# Patient Record
Sex: Female | Born: 1990 | Hispanic: Yes | Marital: Married | Smoking: Never smoker
Health system: Southern US, Community
[De-identification: ages and names within clinical notes are randomized; demographics above are authoritative.]

## PROBLEM LIST (undated history)

## (undated) ENCOUNTER — Ambulatory Visit: Payer: Medicaid Other | Source: Home / Self Care

## (undated) DIAGNOSIS — E039 Hypothyroidism, unspecified: Secondary | ICD-10-CM

## (undated) DIAGNOSIS — R51 Headache: Secondary | ICD-10-CM

## (undated) DIAGNOSIS — F32A Depression, unspecified: Secondary | ICD-10-CM

## (undated) DIAGNOSIS — F329 Major depressive disorder, single episode, unspecified: Secondary | ICD-10-CM

## (undated) DIAGNOSIS — E079 Disorder of thyroid, unspecified: Secondary | ICD-10-CM

## (undated) DIAGNOSIS — F419 Anxiety disorder, unspecified: Secondary | ICD-10-CM

## (undated) DIAGNOSIS — K8020 Calculus of gallbladder without cholecystitis without obstruction: Secondary | ICD-10-CM

## (undated) HISTORY — DX: Depression, unspecified: F32.A

## (undated) HISTORY — DX: Anxiety disorder, unspecified: F41.9

## (undated) HISTORY — DX: Disorder of thyroid, unspecified: E07.9

## (undated) HISTORY — DX: Major depressive disorder, single episode, unspecified: F32.9

---
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
## 2010-03-29 HISTORY — PX: BREAST SURGERY: SHX581

## 2010-04-28 NOTE — Letter (Signed)
Summary: MED SOLUTIONS//APPROVED  MED SOLUTIONS//APPROVED   Imported By: Arta Bruce 07/07/2009 14:41:35  _____________________________________________________________________  External Attachment:    Type:   Image     Comment:   External Document

## 2010-04-28 NOTE — Letter (Signed)
Summary: MED/SOLUTIONS  MED/SOLUTIONS   Imported By: Arta Bruce 07/07/2009 14:37:47  _____________________________________________________________________  External Attachment:    Type:   Image     Comment:   External Document

## 2010-06-11 LAB — DIFFERENTIAL
Basophils Absolute: 0.1 10*3/uL (ref 0.0–0.1)
Basophils Relative: 1 % (ref 0–1)
Eosinophils Absolute: 0.3 10*3/uL (ref 0.0–0.7)
Eosinophils Relative: 4 % (ref 0–5)
Lymphocytes Relative: 43 % (ref 12–46)
Lymphs Abs: 3.2 10*3/uL (ref 0.7–4.0)
Monocytes Absolute: 0.6 10*3/uL (ref 0.1–1.0)
Monocytes Relative: 8 % (ref 3–12)
Neutro Abs: 3.2 10*3/uL (ref 1.7–7.7)
Neutrophils Relative %: 44 % (ref 43–77)

## 2010-06-11 LAB — CBC
HCT: 38.4 % (ref 36.0–46.0)
Hemoglobin: 13.7 g/dL (ref 12.0–15.0)
MCH: 32.5 pg (ref 26.0–34.0)
MCHC: 35.7 g/dL (ref 30.0–36.0)
MCV: 91 fL (ref 78.0–100.0)
Platelets: 221 10*3/uL (ref 150–400)
RBC: 4.22 MIL/uL (ref 3.87–5.11)
RDW: 12.3 % (ref 11.5–15.5)
WBC: 7.3 10*3/uL (ref 4.0–10.5)

## 2010-06-11 LAB — COMPREHENSIVE METABOLIC PANEL
ALT: 19 U/L (ref 0–35)
AST: 24 U/L (ref 0–37)
Albumin: 4.2 g/dL (ref 3.5–5.2)
Alkaline Phosphatase: 63 U/L (ref 39–117)
BUN: 10 mg/dL (ref 6–23)
CO2: 28 mEq/L (ref 19–32)
Calcium: 9.3 mg/dL (ref 8.4–10.5)
Chloride: 106 mEq/L (ref 96–112)
Creatinine, Ser: 0.77 mg/dL (ref 0.4–1.2)
GFR calc Af Amer: >60 mL/min (ref >60)
GFR calc non Af Amer: >60 mL/min (ref >60)
Glucose, Bld: 90 mg/dL (ref 70–99)
Potassium: 3.7 mEq/L (ref 3.5–5.1)
Sodium: 139 mEq/L (ref 135–145)
Total Bilirubin: 0.5 mg/dL (ref 0.3–1.2)
Total Protein: 6.7 g/dL (ref 6.0–8.3)

## 2010-06-11 LAB — WET PREP, GENITAL
Trich, Wet Prep: NONE SEEN
Yeast Wet Prep HPF POC: NONE SEEN

## 2010-06-11 LAB — URINALYSIS, ROUTINE W REFLEX MICROSCOPIC
Glucose, UA: NEGATIVE mg/dL
Hgb urine dipstick: NEGATIVE
Protein, ur: NEGATIVE mg/dL
Specific Gravity, Urine: 1.028 (ref 1.005–1.030)
pH: 5.5 (ref 5.0–8.0)

## 2010-06-11 LAB — GC/CHLAMYDIA PROBE AMP, GENITAL
Chlamydia, DNA Probe: NEGATIVE
GC Probe Amp, Genital: NEGATIVE

## 2010-06-11 LAB — URINE MICROSCOPIC-ADD ON

## 2010-06-11 LAB — POCT PREGNANCY, URINE: Preg Test, Ur: NEGATIVE

## 2010-07-07 LAB — URINALYSIS, ROUTINE W REFLEX MICROSCOPIC
Hgb urine dipstick: NEGATIVE
Protein, ur: NEGATIVE mg/dL
Specific Gravity, Urine: 1.027 (ref 1.005–1.030)
Urobilinogen, UA: 1 mg/dL (ref 0.0–1.0)

## 2010-07-07 LAB — URINE MICROSCOPIC-ADD ON

## 2010-07-07 LAB — PREGNANCY, URINE: Preg Test, Ur: NEGATIVE

## 2010-10-07 ENCOUNTER — Emergency Department (HOSPITAL_COMMUNITY): Payer: Self-pay

## 2010-10-07 ENCOUNTER — Emergency Department (HOSPITAL_COMMUNITY)
Admission: EM | Admit: 2010-10-07 | Discharge: 2010-10-07 | Disposition: A | Discharge status: Home / Self Care | Payer: Self-pay | Attending: Emergency Medicine | Admitting: Emergency Medicine

## 2010-10-07 DIAGNOSIS — N949 Unspecified condition associated with female genital organs and menstrual cycle: Secondary | ICD-10-CM | POA: Insufficient documentation

## 2010-10-07 DIAGNOSIS — R300 Dysuria: Secondary | ICD-10-CM | POA: Insufficient documentation

## 2010-10-07 LAB — POCT PREGNANCY, URINE: Preg Test, Ur: NEGATIVE

## 2010-10-07 LAB — URINALYSIS, ROUTINE W REFLEX MICROSCOPIC
Bilirubin Urine: NEGATIVE
Glucose, UA: NEGATIVE mg/dL
Urobilinogen, UA: 0.2 mg/dL (ref 0.0–1.0)

## 2010-10-07 LAB — URINE MICROSCOPIC-ADD ON

## 2010-10-07 LAB — WET PREP, GENITAL: Trich, Wet Prep: NONE SEEN

## 2010-10-07 MED ORDER — GADOBENATE DIMEGLUMINE 529 MG/ML IV SOLN
15.0000 mL | Freq: Once | INTRAVENOUS | Status: AC | PRN
  Administered 2010-10-07: 15 mL via INTRAVENOUS

## 2010-10-08 LAB — URINE CULTURE: Colony Count: 50000

## 2010-10-08 LAB — GC/CHLAMYDIA PROBE AMP, GENITAL: Chlamydia, DNA Probe: NEGATIVE

## 2010-10-16 ENCOUNTER — Ambulatory Visit: Payer: Self-pay | Admitting: Gynecology

## 2010-10-16 ENCOUNTER — Ambulatory Visit (INDEPENDENT_AMBULATORY_CARE_PROVIDER_SITE_OTHER): Payer: Self-pay | Admitting: Gynecology

## 2010-10-16 DIAGNOSIS — N643 Galactorrhea not associated with childbirth: Secondary | ICD-10-CM

## 2010-10-16 DIAGNOSIS — N949 Unspecified condition associated with female genital organs and menstrual cycle: Secondary | ICD-10-CM

## 2010-10-16 DIAGNOSIS — N83209 Unspecified ovarian cyst, unspecified side: Secondary | ICD-10-CM

## 2010-10-19 ENCOUNTER — Telehealth: Payer: Self-pay | Admitting: *Deleted

## 2010-10-19 ENCOUNTER — Other Ambulatory Visit: Payer: Self-pay | Admitting: Gynecology

## 2010-10-19 DIAGNOSIS — N63 Unspecified lump in unspecified breast: Secondary | ICD-10-CM

## 2010-10-19 DIAGNOSIS — N6310 Unspecified lump in the right breast, unspecified quadrant: Secondary | ICD-10-CM

## 2010-10-19 NOTE — Telephone Encounter (Signed)
APPOINTMENT SET AT THE [facility] 10/21/10 AT 9:30A.M. FOR RT BREAST MASS TAIL SPENCE PER TF.  APPOINTMENT  ALSO SET WITH [facility] FOR RT BREAST MASS AFTER THEN ABOVE IS DONE ON 10/29/10 AT 8:00A.M.WITH DR. Derrell Lolling. PT INFORMED WITH ALL THE ABOVE AND PHONE NUMBERS GIVEN TO PATIENT TO RESCHEDULE IF NEEDED.

## 2010-10-20 ENCOUNTER — Encounter: Payer: Self-pay | Admitting: *Deleted

## 2010-10-21 ENCOUNTER — Ambulatory Visit
Admission: RE | Admit: 2010-10-21 | Discharge: 2010-10-21 | Disposition: A | Discharge status: Home / Self Care | Payer: Self-pay | Source: Ambulatory Visit | Attending: Gynecology | Admitting: Gynecology

## 2010-10-21 DIAGNOSIS — N63 Unspecified lump in unspecified breast: Secondary | ICD-10-CM

## 2010-10-29 ENCOUNTER — Ambulatory Visit (INDEPENDENT_AMBULATORY_CARE_PROVIDER_SITE_OTHER): Payer: Self-pay | Admitting: General Surgery

## 2010-10-30 ENCOUNTER — Telehealth: Payer: Self-pay | Admitting: *Deleted

## 2010-10-30 DIAGNOSIS — R102 Pelvic and perineal pain: Secondary | ICD-10-CM

## 2010-10-30 MED ORDER — IBUPROFEN 800 MG PO TABS: 800.0000 mg | ORAL_TABLET | Freq: Three times a day (TID) | ORAL | Status: AC | PRN

## 2010-10-30 NOTE — Telephone Encounter (Signed)
PT INFORMED WITH ALL THE BELOW.

## 2010-10-30 NOTE — Telephone Encounter (Signed)
(  SEE CHART NOTE 10/16/10) PT C/O PELVIC PAIN X 3DAYS NOW. PT RATED PAIN SCALE #4. PT WANTS TO KNOW WHAT SHE SHOULD DO. PT HAS NOT TOOK ANYTHING TO RELIEVE THE PAIN.PLEASE ADVISE.

## 2010-10-30 NOTE — Telephone Encounter (Signed)
We'll start with Motrin 800 mg every 8 hours and I sent this to the pharmacy was out she does if her pain continues she is to make appointment to come in to see me about possible surgery.

## 2010-11-05 ENCOUNTER — Encounter (INDEPENDENT_AMBULATORY_CARE_PROVIDER_SITE_OTHER): Payer: Self-pay | Admitting: General Surgery

## 2010-11-05 ENCOUNTER — Ambulatory Visit (INDEPENDENT_AMBULATORY_CARE_PROVIDER_SITE_OTHER): Payer: Self-pay | Admitting: General Surgery

## 2010-11-05 VITALS — BP 122/82 | HR 68 | Temp 97.1°F | Ht 64.0 in | Wt 167.2 lb

## 2010-11-05 DIAGNOSIS — N63 Unspecified lump in unspecified breast: Secondary | ICD-10-CM

## 2010-11-05 DIAGNOSIS — N6310 Unspecified lump in the right breast, unspecified quadrant: Secondary | ICD-10-CM

## 2010-11-05 NOTE — Patient Instructions (Signed)
I suspect that you have a fibroadenoma in the right breast. This has been enlarging by history and looks like a fibroadenoma on the ultrasound. You will be scheduled for excision of this under general anesthesia in the near future.

## 2010-11-05 NOTE — Progress Notes (Signed)
Subjective:     Patient ID: Emma Robbins, female   DOB: 12-21-1990, 20 y.o.   MRN: [id number]  HPI This is a 20 year old Hispanic female, sent to me through the courtesy of Dr. Audie Box for evaluation of an enlarging mass in the right breast, lower outer quadrant.  The patient states that she noticed a small mass in the right breast laterally about one year ago. She said that mammograms were done and everything was normal. She says the mass has doubled or tripled in size in one year. Minimal pain. Recent ultrasound was performed at the breast center of [facility] on October 21, 2010. This shows a probable fibroadenoma at the 9:00 position in the far lateral right breast. The patient is very anxious about this because it has been enlarging. She denies any skin change, inflammatory change, or nipple discharge.  She is also seeing Dr. Audie Box because of ovarian cyst and has been placed on birth control pills  Past Medical History  Diagnosis Date  . Fatigue   . Lump in female breast   . Breast pain in female   . Abdominal pain   . Nausea & vomiting   . Blood in urine   . Dizziness   . Rectal bleeding    Current Outpatient Prescriptions  Medication Sig Dispense Refill  . ibuprofen (ADVIL,MOTRIN) 200 MG tablet Take 200 mg by mouth every 6 (six) hours as needed.        . norethindrone-ethinyl estradiol-iron (ESTROSTEP FE,TILIA FE,TRI-LEGEST FE) 1-20/1-30/1-35 MG-MCG tablet Take 1 tablet by mouth daily. Patient unsure of name of birth control issued for cyst.       . ibuprofen (ADVIL,MOTRIN) 800 MG tablet Take 1 tablet (800 mg total) by mouth every 8 (eight) hours as needed for pain.  30 tablet  0   No Known Allergies  Family History  Problem Relation Age of Onset  . Hypertension Maternal Grandmother   . Diabetes Paternal Grandfather   . Hyperlipidemia Father   . Thyroid disease Brother     History  Substance Use Topics  . Smoking status: Never Smoker   . Smokeless  tobacco: Not on file  . Alcohol Use: No      Review of Systems  Constitutional: Negative.   HENT: Negative.   Eyes: Negative.   Respiratory: Negative.   Cardiovascular: Negative.   Gastrointestinal: Negative.   Genitourinary: Negative.   Musculoskeletal: Negative.   Neurological: Negative.   Hematological: Negative.   Psychiatric/Behavioral: Negative.        Objective:   Physical Exam  Constitutional: She is oriented to person, place, and time. She appears well-developed and well-nourished. No distress.  HENT:  Head: Normocephalic and atraumatic.  Mouth/Throat: No oropharyngeal exudate.  Eyes: Conjunctivae are normal. Left eye exhibits no discharge. No scleral icterus.  Neck: Neck supple. No JVD present. No tracheal deviation present. No thyromegaly present.  Cardiovascular: Normal rate and regular rhythm.  Exam reveals friction rub.   No murmur heard. Pulmonary/Chest: Effort normal and breath sounds normal. No respiratory distress. She has no wheezes. She exhibits no tenderness.    Abdominal: Soft. Bowel sounds are normal. She exhibits no distension and no mass. There is no tenderness. There is no rebound and no guarding.  Musculoskeletal: Normal range of motion. She exhibits no edema and no tenderness.  Lymphadenopathy:    She has no cervical adenopathy.  Neurological: She is alert and oriented to person, place, and time. She exhibits normal muscle tone.  Skin: Skin is dry.  No rash noted. No erythema. No pallor.  Psychiatric: She has a normal mood and affect. Her behavior is normal. Judgment and thought content normal.       Assessment:   3 cm right breast mass at lower outer quadrant. 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 This has been enlarging.      Plan:     We'll schedule for elective excision of the right breast mass under anesthesia as an outpatient in the near future.  I have discussed the indications and details of  the surgery with the patient. Risks and complications have been outlined, including limited to bleeding, infection, cosmetic deformity, nerve damage chronic pain or numbness, reoperation if this is malignant, cardiac pulmonary and thromboembolic problems. She seems to understand all these issues well. At this time all her questions were answered. She is in full agree with this plan. She is very anxious to have this removed.

## 2010-11-16 ENCOUNTER — Ambulatory Visit: Payer: Self-pay

## 2010-11-16 ENCOUNTER — Other Ambulatory Visit: Payer: Self-pay

## 2010-11-16 ENCOUNTER — Encounter: Payer: Self-pay | Admitting: Gynecology

## 2010-11-16 ENCOUNTER — Ambulatory Visit (INDEPENDENT_AMBULATORY_CARE_PROVIDER_SITE_OTHER): Payer: Self-pay | Admitting: Gynecology

## 2010-11-16 DIAGNOSIS — N926 Irregular menstruation, unspecified: Secondary | ICD-10-CM

## 2010-11-16 DIAGNOSIS — R1031 Right lower quadrant pain: Secondary | ICD-10-CM

## 2010-11-16 DIAGNOSIS — R1903 Right lower quadrant abdominal swelling, mass and lump: Secondary | ICD-10-CM

## 2010-11-16 DIAGNOSIS — D3910 Neoplasm of uncertain behavior of unspecified ovary: Secondary | ICD-10-CM

## 2010-11-16 DIAGNOSIS — N83209 Unspecified ovarian cyst, unspecified side: Secondary | ICD-10-CM

## 2010-11-16 DIAGNOSIS — N949 Unspecified condition associated with female genital organs and menstrual cycle: Secondary | ICD-10-CM

## 2010-11-16 NOTE — Progress Notes (Signed)
Patient presents in followup of her right ovarian/paraovarian cystic area. In review she presented to the emergency room in July complaining of pain which actually have been going on for 2 years but became acute in July. Ultrasound showed a left hemorrhagic appearing cyst at 4-5 cm and a simple right paraovarian cyst negative flow with a small mural nodule. MRI confirmed this and she then followed up with [place]. A repeat ultrasound was ordered and she presents today for that. Ultrasound shows persistence of the right ovarian/paraovarian cyst at 65 mm with a small mural nodule. There is negative flow.  Her left ovary is normal the prior hemorrhagic-appearing cyst has resolved.  Her CA 125 was ordered previously and returned 6.2. Patient currently is having no pain and she has started on oral contraceptives as per our last visit.  I discussed with her and her mother the situation. My concern for malignancy is low but possible. Options for management include surgery now versus observation reassessment with ultrasound and and reconsidering surgery. She is pain-free at this point. After lengthy discussion tentatively schedule her for surgery in 2 months perform an ultrasound the week before and if this area persists then we'll proceed with surgery.  Patient agrees with the plan and is comfortable with waiting 2 months for reassessment.  She does understand the potential for malignancy.

## 2010-11-20 ENCOUNTER — Ambulatory Visit (HOSPITAL_BASED_OUTPATIENT_CLINIC_OR_DEPARTMENT_OTHER)
Admission: RE | Admit: 2010-11-20 | Discharge: 2010-11-20 | Disposition: A | Discharge status: Home / Self Care | Payer: Self-pay | Source: Ambulatory Visit | Attending: General Surgery | Admitting: General Surgery

## 2010-11-20 ENCOUNTER — Other Ambulatory Visit (INDEPENDENT_AMBULATORY_CARE_PROVIDER_SITE_OTHER): Payer: Self-pay | Admitting: General Surgery

## 2010-11-20 DIAGNOSIS — D249 Benign neoplasm of unspecified breast: Secondary | ICD-10-CM

## 2010-11-20 DIAGNOSIS — Z01812 Encounter for preprocedural laboratory examination: Secondary | ICD-10-CM | POA: Insufficient documentation

## 2010-11-23 ENCOUNTER — Encounter: Payer: Self-pay | Admitting: Gynecology

## 2010-11-23 ENCOUNTER — Telehealth (INDEPENDENT_AMBULATORY_CARE_PROVIDER_SITE_OTHER): Payer: Self-pay

## 2010-11-23 NOTE — Progress Notes (Signed)
11-23-10 I sent patient a letter with her summary of charges for surgery Dr. Velvet Bathe has recommended.  I asked her to call me to schedule for Oct/Nov as Dr. Velvet Bathe recommended. ka

## 2010-11-23 NOTE — Telephone Encounter (Signed)
Pt home doing well. Po appt made. 

## 2010-11-23 NOTE — Op Note (Signed)
  Emma Robbins, Emma Robbins NO.:  [id number]  MEDICAL RECORD NO.:  [id number]  LOCATION:                                 FACILITY:  PHYSICIAN:  Angelia Mould. Derrell Lolling, M.D.DATE OF BIRTH:  1990-10-07  DATE OF PROCEDURE:  11/20/2010 DATE OF DISCHARGE:                              OPERATIVE REPORT   PREOPERATIVE DIAGNOSIS:  Right breast mass, suspect fibroadenoma.  POSTOPERATIVE DIAGNOSIS:  Right breast mass, suspect fibroadenoma.  OPERATION PERFORMED:  Right partial mastectomy.  SURGEON:  Angelia Mould. Derrell Lolling, MD  OPERATIVE INDICATIONS:  This is a 20 year old Hispanic female who has noticed a palpable mass in the right breast for about 1 year.  She says the mass has almost tripled in size in 1 year.  There is almost no pain. Recent ultrasound shows a probable fibroadenoma at the 9 o'clock position in the far lateral right breast.  The patient is very anxious about this because of his rapid enlargement.  On exam, this feels like a large fibroadenoma, being very firm, very smooth, very rubbery, very well marginated, very mobile at the 8:30 position of the right breast very lateral.  She is brought to the operating room electively.  OPERATIVE TECHNIQUE:  Following the induction of a general LMA anesthetic, the patient's right breast was prepped and draped in a sterile fashion.  The patient was identified as correct patient, correct procedure, and correct site.  Intravenous antibiotics were given. Marcaine 0.5% with epinephrine was used as a local infiltration anesthetic.  A curvilinear incision was made in the lateral aspect of the right breast.  The incision was in Langer's lines.  Dissection carried down through the subcutaneous tissue and into the superficial breast tissue when we encountered the capsule of a typical fibroadenoma. This had at least two or three lobes to it, but dissected away from the surrounding tissues fairly easily.  We used electrocautery to  dissected away, intact.  It looked to be about 2.5 x 2 cm in size and it was sent for routine histology.  I further explored the wound palpating thoroughly and felt no other fibroadenomas or masses.  The wound was irrigated with saline.  Hemostasis was excellent and achieved with electrocautery.  The deeper tissues were closed with interrupted sutures of 3-0 Vicryl and the skin was closed with a running subcuticular suture of 4-0 Monocryl and Dermabond.  The patient tolerated the procedure well and was taken to the recovery room in excellent condition.  Estimated blood loss was about 5-10 mL. Complications none.  Sponge, needle and instrument counts were correct.     Angelia Mould. Derrell Lolling, M.D.     HMI/MEDQ  D:  11/20/2010  T:  11/20/2010  Job:  [id number]  cc:   Marcial Pacas P. Fontaine, M.D.  Electronically Signed by Claud Kelp M.D. on 11/23/2010 07:02:37 PM

## 2010-11-24 ENCOUNTER — Telehealth (INDEPENDENT_AMBULATORY_CARE_PROVIDER_SITE_OTHER): Payer: Self-pay

## 2010-11-24 NOTE — Telephone Encounter (Signed)
Pt advised of path result as benign.

## 2010-11-24 NOTE — Telephone Encounter (Signed)
lmom for pt to call for path result.

## 2010-12-01 ENCOUNTER — Telehealth: Payer: Self-pay

## 2010-12-01 ENCOUNTER — Other Ambulatory Visit: Payer: Self-pay | Admitting: Gynecology

## 2010-12-01 DIAGNOSIS — N83209 Unspecified ovarian cyst, unspecified side: Secondary | ICD-10-CM

## 2010-12-01 NOTE — Telephone Encounter (Signed)
I called patient back. I scheduled her Lap Ovarian Cystectomy for Wed Sept 19 at 1pm at [facility].  I scheduled her preop consult for Monday Sept 17 at 12:30pm. Instructions given to patient.   Patient is private pay and has some questions regarding her prepymt to [facility]. I will be talking with [place]. Regarding these issues and I will get back with the patient.

## 2010-12-01 NOTE — Telephone Encounter (Signed)
Patient called in my voice mail stating that due to pain she would like to go ahead and schedule sooner than the 2-3 months she had decided upon at last office visit.  I will call her back and schedule her.  Will you still want another u/s if we schedule it in the next few weeks??  Also, if not, will you want preop consult with her?

## 2010-12-01 NOTE — Telephone Encounter (Signed)
Schedule now is okay and she will need a preop consult

## 2010-12-03 ENCOUNTER — Telehealth: Payer: Self-pay

## 2010-12-03 NOTE — Telephone Encounter (Signed)
Left message on voice mail that I have an answer in regards to arrangements for surgery prepayment.  I gave her the 380 line access.

## 2010-12-04 NOTE — Telephone Encounter (Signed)
Patient called me back. I told her our administrator said she would need to put $1000 down on her surgery prior to surgery.  Patient asked about anywhere else she could go where they would work with her on money. I told her about GYN clinic at [facility] and gave her her phone number. She is going somewhere today to talk with someone about helping with her medical bills. She wants me to hold surgery date until she talks with them and she will let me know what she wants to do after that.  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  I offered her financial assistance form for [employer] and she declined that until she sees what the people tell her today.  I will wait to hear from her.

## 2010-12-07 ENCOUNTER — Ambulatory Visit (INDEPENDENT_AMBULATORY_CARE_PROVIDER_SITE_OTHER): Payer: Self-pay | Admitting: General Surgery

## 2010-12-07 VITALS — BP 110/68 | HR 72

## 2010-12-07 DIAGNOSIS — Z9889 Other specified postprocedural states: Secondary | ICD-10-CM

## 2010-12-07 DIAGNOSIS — D249 Benign neoplasm of unspecified breast: Secondary | ICD-10-CM

## 2010-12-07 NOTE — Progress Notes (Signed)
Subjective:     Patient ID: Emma Robbins, female   DOB: 1990-10-29, 20 y.o.   MRN: [id number]  HPI This patient underwent excisional biopsy of a right breast mass on November 20, 2010. The final pathology report shows a benign fibroadenoma. The patient has been given a copy of her pathology report. She has no complaints about wound healing. She was she may return to the gym and exercise.  Review of Systems     Objective:   Physical Exam The wound is healing very nicely. Small incision lateral right breast. Minimal bruising. No hematoma. No infection.    Assessment:     Fibroadenoma right breast, lateral aspect, recovering uneventfully following excision.    Plan:     Okay to resume all normal physical activities without restriction, except no contact sports for 2 more weeks.  Mammogram should begin at age 26 .  Return to see me PRN.

## 2010-12-07 NOTE — Patient Instructions (Signed)
The final pathology report of your right breast biopsy shows a benign fibroadenoma. Your wounds are healing nicely. You may exercise, but I would avoid contact sports for 2 more weeks. You should get a mammogram at age 20 and annually thereafter unless there are new problems. Return to see me if any new problems arise.

## 2010-12-08 ENCOUNTER — Telehealth: Payer: Self-pay

## 2010-12-08 NOTE — Telephone Encounter (Signed)
Patient had said she would call me to confirm her surgery date after working out some financial details last week. Since I have not heard from her I called to follow up.  She said that she got approved for financial assistance from [facility]. I explained that we are small private practice and we do not participate with that assistance and the$1000 prepymt will still be required. She said that would not be a problem and she will be able to pay that and do her surgery as scheduled. She has pre op appt 9/17 and she will make her prepymt at that time.

## 2010-12-09 NOTE — Patient Instructions (Addendum)
   Your procedure is scheduled on:Wednesday 12/16/2010  Enter through the Main Entrance of [facility] at:11:30 am Pick up the phone at the desk and dial [phone]  Please call this number if you have any problems the morning of surgery: [phone]  Remember: Do not eat food after midnight  Do not drink clear liquids after:9 am  Do not wear jewelry, make-up, or FINGER nail polish Do not wear lotions, powders, or perfumes. Do not shave 48 hours prior to surgery. Do not bring valuables to the hospital. Leave suitcase in the car. After Surgery it may be brought to your room. For patients being admitted to the hospital, checkout time is 11:00am the day of discharge.  Patients discharged on the day of surgery will not be allowed to drive home.   Name and phone number of your driver: husband-Jose [phone]   Remember to use your hibiclens as instructed.

## 2010-12-10 ENCOUNTER — Encounter (HOSPITAL_COMMUNITY): Payer: Self-pay

## 2010-12-10 ENCOUNTER — Encounter (HOSPITAL_COMMUNITY)
Admission: RE | Admit: 2010-12-10 | Discharge: 2010-12-10 | Disposition: A | Discharge status: Home / Self Care | Payer: Self-pay | Source: Ambulatory Visit | Attending: Gynecology | Admitting: Gynecology

## 2010-12-10 LAB — CBC
MCH: 32.3 pg (ref 26.0–34.0)
MCHC: 34.5 g/dL (ref 30.0–36.0)
Platelets: 223 10*3/uL (ref 150–400)
RDW: 12.6 % (ref 11.5–15.5)

## 2010-12-10 LAB — SURGICAL PCR SCREEN: MRSA, PCR: NEGATIVE

## 2010-12-13 ENCOUNTER — Encounter (HOSPITAL_COMMUNITY): Payer: Self-pay | Admitting: Gynecology

## 2010-12-14 ENCOUNTER — Encounter: Payer: Self-pay | Admitting: Gynecology

## 2010-12-14 ENCOUNTER — Encounter (HOSPITAL_COMMUNITY): Payer: Self-pay | Admitting: Gynecology

## 2010-12-14 ENCOUNTER — Ambulatory Visit (INDEPENDENT_AMBULATORY_CARE_PROVIDER_SITE_OTHER): Payer: Self-pay | Admitting: Gynecology

## 2010-12-14 DIAGNOSIS — N83201 Unspecified ovarian cyst, right side: Secondary | ICD-10-CM

## 2010-12-14 DIAGNOSIS — N949 Unspecified condition associated with female genital organs and menstrual cycle: Secondary | ICD-10-CM

## 2010-12-14 DIAGNOSIS — R102 Pelvic and perineal pain: Secondary | ICD-10-CM

## 2010-12-14 DIAGNOSIS — N83209 Unspecified ovarian cyst, unspecified side: Secondary | ICD-10-CM

## 2010-12-14 NOTE — H&P (Signed)
Emma Robbins 23-May-1990 [id number]   Preoperative History and Physical  Chief complaint: Persistent right ovarian cyst with pelvic pain  History of present illness: 20 y.o.  G0 initially presented in June to the emergency room complaining of pelvic pain. Ultrasound and MRI showed a presumed clear right ovarian cyst although there is some question as to whether it is adnexal versus true ovarian measuring 6.2 cm with a small mural nodule. The left ovary showed a more complex cystic area measuring 4.9 cm that was felt to have a classic appearance of a hemorrhagic cyst. A repeat ultrasound was performed in August which showed persistence of the simple right presumed ovarian cyst measuring 6.5 cm a small mural nodule and a CA 125 of 6.2. There was negative Doppler flow to this area.  Her left ovarian cyst had resolved. The patient has had a persistence of right sided pelvic pain is admitted for laparoscopic evaluation and removal of the right ovarian cyst.  Options for expectant management with re\re ultrasound was offered but due to the discomfort the patient was to proceed with surgery. She was started on oral contraceptives and has continued on these.  Review of systems: Noncontributory except stated in HPI   Physical exam:  Afebrile vital signs are stable  General: well developed, well nourished female, no acute distress HEENT: normal  Lungs: clear to auscultation without wheezing, rales or rhonchi  Cardiac: regular rate without rubs, murmurs or gallops  Abdomen: soft, nontender without masses, guarding, rebound, organomegaly  Pelvic: external bus vagina: normal   Cervix: grossly normal  Uterus: normal size, midline and mobile, nontender  Adnexa: Right with tenderness, no masses.  Left without masses or tenderness    Assessment: 20 year old G0 with persistent right ovarian clear cyst small mural nodule negative Doppler flow CA 125 of 6.2.  Patient is having persistent pelvic  pain and elects for surgical intervention at this time.  Plan:  The options for management including expectant versus surgical intervention reviewed and offered to and the patient wants to proceed with surgery. I reviewed with her what's involved with laparoscopic surgery to include general anesthesia, multiple port sites, insufflation, trocar placement, use of sharp blunt dissection, electrocautery, harmonic scalpel and other technologies. She understands the plan will be to remove the cyst only and leave remaining ovarian tissue and fallopian tube but she also understands no guarantees are made and that I may need to remove her ovary and fallopian tube on that side and she understands and accepts this. The potential for fertility issues by remove her fallopian tube and ovary was discussed and she understands and accepts this. The patient also understands that no pelvic pathology may be encountered in that the cyst may have resolved and she may have a negative laparoscopy.  No guarantees as far as pain relief were made and she understands and accepts this. She also understands that we assume that this is a right ovarian cyst but it may be a paraovarian cyst or non-gynecologic accepts the possibility also.  The acute intraoperative and postoperative courses were reviewed and the risks to include infection, prolonged antibiotics, abscess formation requiring reoperation and abscess drainage. The risk of hemorrhage necessitating transfusion and the risks of transfusion including transfusion reaction, hepatitis, HIV, mad cow disease and other unknown entities. The risk of incisional complications including opening and draining of incisions, closure by secondary intention and long-term issues of cosmetic such as keloid formation as well as hernia formation was discussed with her. She understands there are  no guarantees that we will perform this laparoscopically and I may need to extend the incision which would mean a  larger scar and longer recovery period. The risks of inadvertent injury to internal organs either immediately recognized or delay recognized including bowel, bladder, ureters, vessels and nerves necessitating major exploratory reparative surgeries and future reparative surgeries including bladder repair, ureteral damage repair, bowel resection ,and ostomy formation was all discussed understood and accepted. Again the patient understands that I will attempt as conservative a procedure as I can but that we may need to take her ovary and fallopian tube and she gives me permission for this. I also discussed with her that this may be a malignancy and if it overtly is a malignancy we will terminate the procedure at that point and I will refer her to a gynecologic oncologist. The possibility during conservative removal and cystectomy for ruptured was reviewed and that this could alter prognosis and she understands and accepts this. Patient's questions were answered to her satisfaction and she is ready to proceed with surgery.    Dara Lords MD, 4:52 PM 12/14/2010

## 2010-12-14 NOTE — Progress Notes (Signed)
Patient presents preop for her upcoming laparoscopic right ovarian cystectomy. She has a history of a persistent right ovarian cyst 6 cm since June now having right-sided pain wants to proceed with surgery. Initially we planned expected management but given the persistence of her pain she now wants to proceed with surgery now. She is on oral contraceptives and will continue to take these.  Exam HEENT: Normal Lungs: Clear Cardiac: Regular rate no rubs murmurs or gallops Abdomen: Soft nontender without masses guarding rebound organomegaly Pelvic: External BUS vagina normal, cervix normal, uterus grossly normal in size midline and mobile nontender adnexa with right side tender no palpable masses left side without tenderness or masses.  Assessment and plan: 20 year old G0 persistent right-sided pelvic pain right ovarian cyst on ultrasound and MRI in June repeat ultrasound August 15th shows persistence of her right ovarian cyst 6 cm range with a mural nodule, her CA 125 was 6.2.  Again reviewed the options for management including expectant versus surgical intervention she wants to proceed with surgery. I reviewed with her what's involved with laparoscopic surgery to include general anesthesia, multiple port sites, insufflation, trocar placement, use of sharp blunt dissection, electrocautery harmonic scalpel and other technologies. She understands the plan will be to remove the cyst only and leave remaining ovarian tissue and fallopian tube but she also understands no guarantees are made and that I may need to remove her ovary and fallopian tube on that side and she understands and accepts this. The potential for fertility issues by remove her fallopian tube and ovary was discussed and she understands and accepts this. The acute intraoperative and postoperative courses were reviewed and the risks to include infection, prolonged antibiotics, abscess formation requiring reoperation and abscess drainage. The risk  of hemorrhage necessitating transfusion and the risks of transfusion including transfusion reaction, hepatitis, HIV, mad cow disease and other unknown entities. The risk of incisional complications including opening and draining of incisions, closure by secondary intention and long-term issues of cosmetic such as keloid formation as well as hernia formation was discussed with her. She understands there are no guarantees that we will perform this laparoscopically and I may need to extend the incision which would mean a larger scar and longer recovery period. The risks of inadvertent injury to internal organs either immediately recognized or delay recognized including bowel, bladder, ureters, vessels and nerves necessitating major exploratory reparative surgeries and future reparative surgeries including bladder repair, ureteral damage repair, bowel resection ,and ostomy formation was all discussed understood and accepted. Again the patient understands that I will attempt as conservative a procedure as I can but that we may need to take her ovary and fallopian tube and she gives me permission for this. I also discussed with her that this may be a malignancy and if it overtly is a malignancy we will terminate the procedure at that point and I will refer her to a gynecologic oncologist. The possibility during conservative removal and cystectomy for ruptured was reviewed and that this could alter prognosis and she understands and accepts this. Patient's questions were answered to her satisfaction and she is ready to proceed with surgery.

## 2010-12-15 MED ORDER — DEXTROSE 5 % IV SOLN
1.0000 g | INTRAVENOUS | Status: DC
  Filled 2010-12-15: qty 1

## 2010-12-16 ENCOUNTER — Other Ambulatory Visit: Payer: Self-pay | Admitting: Gynecology

## 2010-12-16 ENCOUNTER — Encounter (HOSPITAL_COMMUNITY)
Admission: RE | Disposition: A | Discharge status: Home / Self Care | Payer: Self-pay | Source: Ambulatory Visit | Attending: Gynecology

## 2010-12-16 ENCOUNTER — Encounter: Payer: Self-pay | Admitting: Gynecology

## 2010-12-16 ENCOUNTER — Encounter (HOSPITAL_COMMUNITY): Payer: Self-pay | Admitting: Anesthesiology

## 2010-12-16 ENCOUNTER — Ambulatory Visit (HOSPITAL_COMMUNITY)
Admission: RE | Admit: 2010-12-16 | Discharge: 2010-12-16 | Disposition: A | Discharge status: Home / Self Care | Payer: Self-pay | Source: Ambulatory Visit | Attending: Gynecology | Admitting: Gynecology

## 2010-12-16 ENCOUNTER — Ambulatory Visit (HOSPITAL_COMMUNITY): Payer: Self-pay | Admitting: Anesthesiology

## 2010-12-16 DIAGNOSIS — Z01812 Encounter for preprocedural laboratory examination: Secondary | ICD-10-CM | POA: Insufficient documentation

## 2010-12-16 DIAGNOSIS — D251 Intramural leiomyoma of uterus: Secondary | ICD-10-CM | POA: Insufficient documentation

## 2010-12-16 DIAGNOSIS — N949 Unspecified condition associated with female genital organs and menstrual cycle: Secondary | ICD-10-CM | POA: Insufficient documentation

## 2010-12-16 DIAGNOSIS — N83209 Unspecified ovarian cyst, unspecified side: Secondary | ICD-10-CM | POA: Insufficient documentation

## 2010-12-16 DIAGNOSIS — Z01818 Encounter for other preprocedural examination: Secondary | ICD-10-CM | POA: Insufficient documentation

## 2010-12-16 HISTORY — PX: LAPAROSCOPIC OVARIAN CYSTECTOMY: SUR786

## 2010-12-16 SURGERY — LAPAROSCOPY, DIAGNOSTIC
Anesthesia: General | Laterality: Right

## 2010-12-16 MED ORDER — BUPIVACAINE HCL (PF) 0.25 % IJ SOLN
INTRAMUSCULAR | Status: DC | PRN
  Administered 2010-12-16: 10 mL

## 2010-12-16 MED ORDER — KETOROLAC TROMETHAMINE 30 MG/ML IJ SOLN
INTRAMUSCULAR | Status: AC
  Filled 2010-12-16: qty 1

## 2010-12-16 MED ORDER — DEXTROSE 5 % IV SOLN
1.0000 g | INTRAVENOUS | Status: DC | PRN
  Administered 2010-12-16: 1 g via INTRAVENOUS

## 2010-12-16 MED ORDER — GLYCOPYRROLATE 0.2 MG/ML IJ SOLN
INTRAMUSCULAR | Status: DC | PRN
  Administered 2010-12-16: .4 mg via INTRAVENOUS

## 2010-12-16 MED ORDER — LIDOCAINE HCL (CARDIAC) 20 MG/ML IV SOLN
INTRAVENOUS | Status: DC | PRN
  Administered 2010-12-16: 60 mg via INTRAVENOUS

## 2010-12-16 MED ORDER — ONDANSETRON HCL 4 MG/2ML IJ SOLN
INTRAMUSCULAR | Status: AC
  Filled 2010-12-16: qty 2

## 2010-12-16 MED ORDER — MIDAZOLAM HCL 5 MG/5ML IJ SOLN
INTRAMUSCULAR | Status: DC | PRN
  Administered 2010-12-16: 2 mg via INTRAVENOUS

## 2010-12-16 MED ORDER — LACTATED RINGERS IV SOLN
INTRAVENOUS | Status: DC
  Administered 2010-12-16: 1000 mL via INTRAVENOUS
  Administered 2010-12-16 (×2): via INTRAVENOUS

## 2010-12-16 MED ORDER — NEOSTIGMINE METHYLSULFATE 1 MG/ML IJ SOLN
INTRAMUSCULAR | Status: DC | PRN
  Administered 2010-12-16: 2 mg via INTRAMUSCULAR

## 2010-12-16 MED ORDER — PROPOFOL 10 MG/ML IV EMUL
INTRAVENOUS | Status: AC
  Filled 2010-12-16: qty 20

## 2010-12-16 MED ORDER — SODIUM CHLORIDE 0.9 % IV SOLN: INTRAVENOUS | Status: DC

## 2010-12-16 MED ORDER — DEXAMETHASONE SODIUM PHOSPHATE 10 MG/ML IJ SOLN
INTRAMUSCULAR | Status: DC | PRN
  Administered 2010-12-16: 10 mg via INTRAVENOUS

## 2010-12-16 MED ORDER — KETOROLAC TROMETHAMINE 30 MG/ML IJ SOLN
INTRAMUSCULAR | Status: DC | PRN
  Administered 2010-12-16: 30 mg via INTRAVENOUS

## 2010-12-16 MED ORDER — LIDOCAINE HCL (CARDIAC) 20 MG/ML IV SOLN
INTRAVENOUS | Status: AC
  Filled 2010-12-16: qty 5

## 2010-12-16 MED ORDER — MIDAZOLAM HCL 2 MG/2ML IJ SOLN
INTRAMUSCULAR | Status: AC
  Filled 2010-12-16: qty 2

## 2010-12-16 MED ORDER — ROCURONIUM BROMIDE 50 MG/5ML IV SOLN
INTRAVENOUS | Status: AC
  Filled 2010-12-16: qty 1

## 2010-12-16 MED ORDER — FENTANYL CITRATE 0.05 MG/ML IJ SOLN
INTRAMUSCULAR | Status: DC | PRN
  Administered 2010-12-16 (×2): 50 ug via INTRAVENOUS
  Administered 2010-12-16: 100 ug via INTRAVENOUS

## 2010-12-16 MED ORDER — PROPOFOL 10 MG/ML IV EMUL
INTRAVENOUS | Status: DC | PRN
  Administered 2010-12-16: 180 mg via INTRAVENOUS

## 2010-12-16 MED ORDER — ONDANSETRON HCL 4 MG/2ML IJ SOLN
INTRAMUSCULAR | Status: DC | PRN
  Administered 2010-12-16: 4 mg via INTRAVENOUS

## 2010-12-16 MED ORDER — DEXAMETHASONE SODIUM PHOSPHATE 10 MG/ML IJ SOLN
INTRAMUSCULAR | Status: AC
  Filled 2010-12-16: qty 1

## 2010-12-16 MED ORDER — FENTANYL CITRATE 0.05 MG/ML IJ SOLN
INTRAMUSCULAR | Status: AC
  Filled 2010-12-16: qty 5

## 2010-12-16 MED ORDER — ROCURONIUM BROMIDE 100 MG/10ML IV SOLN
INTRAVENOUS | Status: DC | PRN
  Administered 2010-12-16: 10 mg via INTRAVENOUS
  Administered 2010-12-16: 35 mg via INTRAVENOUS

## 2010-12-16 SURGICAL SUPPLY — 24 items
BLADE SURG 15 STRL LF C SS BP (BLADE) ×1 IMPLANT
BLADE SURG 15 STRL SS (BLADE) ×1
CABLE HIGH FREQUENCY MONO STRZ (ELECTRODE) IMPLANT
CATH ROBINSON RED A/P 16FR (CATHETERS) ×2 IMPLANT
CLOTH BEACON ORANGE TIMEOUT ST (SAFETY) ×2 IMPLANT
DERMABOND ADVANCED (GAUZE/BANDAGES/DRESSINGS) IMPLANT
GLOVE BIO SURGEON STRL SZ7.5 (GLOVE) ×4 IMPLANT
GOWN PREVENTION PLUS LG XLONG (DISPOSABLE) ×6 IMPLANT
NS IRRIG 1000ML POUR BTL (IV SOLUTION) ×2 IMPLANT
PACK LAPAROSCOPY BASIN (CUSTOM PROCEDURE TRAY) ×2 IMPLANT
SCALPEL HARMONIC ACE (MISCELLANEOUS) IMPLANT
SET IRRIG TUBING LAPAROSCOPIC (IRRIGATION / IRRIGATOR) IMPLANT
SLEEVE Z-THREAD 5X100MM (TROCAR) IMPLANT
SOLUTION ELECTROLUBE (MISCELLANEOUS) IMPLANT
SUT CHROMIC 4 0 PS 2 18 (SUTURE) IMPLANT
SUT VIC AB 2-0 CT1 27 (SUTURE)
SUT VIC AB 2-0 CT1 TAPERPNT 27 (SUTURE) IMPLANT
SUT VICRYL 0 ENDOLOOP (SUTURE) IMPLANT
SUT VICRYL 0 UR6 27IN ABS (SUTURE) ×2 IMPLANT
SUT VICRYL 4-0 PS2 18IN ABS (SUTURE) IMPLANT
TOWEL OR 17X24 6PK STRL BLUE (TOWEL DISPOSABLE) ×4 IMPLANT
TROCAR XCEL NON-BLD 11X100MML (ENDOMECHANICALS) ×2 IMPLANT
TROCAR Z-THREAD FIOS 5X100MM (TROCAR) ×2 IMPLANT
WATER STERILE IRR 1000ML POUR (IV SOLUTION) ×2 IMPLANT

## 2010-12-16 NOTE — Op Note (Signed)
Emma Robbins Jun 28, 1990 [id number]   Post Operative Note   Date of surgery:  12/16/2010  Pre Op Dx: Right ovarian cyst  Post Op Dx:  Right ovarian cyst  Procedure:  Laparoscopic right ovarian cystectomy  Surgeon:  Dara Lords  Assistant:  Reynaldo Minium  Anesthesia:  General  Local Injection:  5 cc 0.25% Marcaine skin injection  EBL:  minimal  Complications:  None  Specimen:  #1 opening cell washings with cyst fluid #2 right ovarian cyst wall to pathology   Findings: EUA:  External BUS vagina normal, cervix normal, uterus anteverted normal size midline mobile, left adnexa without masses right adnexa with fullness   Operative:  Anterior cul-de-sac: Normal, posterior cul-de-sac: Normal, uterus: Normal size shape contour, left ovary grossly normal free of mobile, left fallopian tube normal length caliber fimbriated end, right ovary with cystic mass originating from pole of the ovary into the mesosalpinx approximately 5 cm, right fallopian tube attenuated across the mass but grossly was normal length caliber and fimbriated end. No evidence of pelvic endometriosis or pelvic adhesions. Upper abdominal exam appendix grossly normal free and mobile liver is smooth no abnormalities gallbladder grossly normal to limited inspection no upper abdominal pathology grossly evident   Procedure:  Patient was taken to the operating room, properly identified, placed in the supine position, underwent general endotracheal anesthesia without difficulty. Patient was placed in the low dorsal lithotomy position received abdominal perineal and vaginal preparation with Betadine solution bladder at the with in and out Foley catheterization and EUA was performed. Cervix was visualized with a speculum anterior lip grasped with a single-tooth tenaculum and a Hulka tenaculum was placed on the cervix.  The patient was draped in usual fashion, a vertical infraumbilical incision was made and using  the 10 mm Optiview direct entry trocar the abdomen the directly entered under direct visualization without difficulty. The abdomen was then insufflated. Right and left suprapubic 5 mm ports were then placed under direct visualization after transillumination for the vessels without difficulty. Examination of the pelvic organs and upper abdominal exam was carried out with findings noted above. Opening cell washings obtained. The cystic mass was then inspected and found to be within the mesial salpinx with the fallopian tube stretched across the entire mass. It appeared that the mass came from the pole of the ovary. Initial incision was made in the mesosalpinx on the side opposite the fallopian tube. It became evident from a technical standpoint that evacuation of the cyst was necessary to achieve optimum visualization to allow salvage of the fallopian tube. A small incision was made into the cyst and the irrigator aspirator cannula was placed within the cyst and the fluid was evacuated. This was sent along with the opening cell washings to pathology.  The incision in the mesosalpinx was extended using the harmonic scalpel and through blunt dissection the cyst was peeled free from the surrounding mesosalpinx with care to avoid handling and damaging the fallopian tube. With the fallopian tube away from the remaining cyst attachment the tissues were transected using the harmonic scalpel to achieve complete removal of the cyst wall. A 5 mm laparoscope was placed through the suprapubic 5 mm port and using an Endopouch the cyst wall was retrieved through the 10 mm umbilical port. The abdomen was reinsufflated the pelvis copiously irrigated and several small bleeding points along the mesosalpinx was addressed with bipolar cautery. The fallopian tube was then reinspected along its entire course was found to be intact with normal fimbria.  The right ureter was identified along its course away from the operative site. The  suprapubic ports were then removed, the gas slowly allowed to escape, both port sites as well as the mesosalpinx were reinspected under a low pressure situation showing adequate hemostasis. Of note the incision line in the mesosalpinx reapproximated nicely it was felt that Interceed or other such material was not appropriate. The 10 mm laparoscopic port was then backed out under direct visualization showing adequate hemostasis and no evidence of hernia formation. All skin incisions were injected using 0.25% Marcaine and the umbilical port was closed using 0 Vicryl suture in a interrupted subcutaneous / fascial stitch. All skin incisions were closed using 4-0 Vicryl in interrupted cuticular stitch. Patient received intraoperative Toradol, sterile dressings were applied, the Hulka tenaculum was removed, patient placed in a supine position, awakened without difficulty and taken to recovery room in good condition having tolerated the procedure well.    Dara Lords MD, 2:36 PM 12/16/2010

## 2010-12-16 NOTE — Anesthesia Procedure Notes (Signed)
Procedure Name: Intubation Date/Time: 12/16/2010 1:20 PM Performed by: Cephus Shelling Pre-anesthesia Checklist: Patient identified, Patient being monitored, Emergency Drugs available and Suction available Patient Re-evaluated:Patient Re-evaluated prior to inductionOxygen Delivery Method: Circle System Utilized Preoxygenation: Pre-oxygenation with 100% oxygen Intubation Type: Combination inhalational/ intravenous induction Ventilation: Mask ventilation without difficulty Laryngoscope Size: Mac and 3 Grade View: Grade I Tube type: Oral Tube size: 7.0 mm Number of attempts: 1 Airway Equipment and Method: stylet Placement Confirmation: ETT inserted through vocal cords under direct vision,  positive ETCO2 and breath sounds checked- equal and bilateral Secured at: 20 cm Tube secured with: Tape Dental Injury: Teeth and Oropharynx as per pre-operative assessment

## 2010-12-16 NOTE — Transfer of Care (Signed)
Immediate Anesthesia Transfer of Care Note  Patient: Emma Robbins  Procedure(s) Performed:  LAPAROSCOPY DIAGNOSTIC - LAP OVARIAN CYSTECTOMY IS HOW PROCEDURE READS/LOOKING FOR 7:30A OR 1:00PM TIME  Patient Location: PACU  Anesthesia Type: General  Level of Consciousness: awake, alert , oriented and patient cooperative  Airway & Oxygen Therapy: Patient Spontanous Breathing and Patient connected to nasal cannula oxygen  Post-op Assessment: Report given to PACU RN and Post -op Vital signs reviewed and stable  Post vital signs: Reviewed and stable  Complications: No apparent anesthesia complications

## 2010-12-16 NOTE — H&P (Signed)
Dictated history and physical current.  Dara Lords MD, 12:52 PM 12/16/2010

## 2010-12-16 NOTE — Anesthesia Preprocedure Evaluation (Addendum)
Anesthesia Evaluation  Name, MR# and DOB Patient awake  General Assessment Comment  Reviewed: Allergy & Precautions, H&P , Patient's Chart, lab work & pertinent test results, reviewed documented beta blocker date and time   History of Anesthesia Complications Negative for: history of anesthetic complications  Airway Mallampati: II TM Distance: >3 FB Neck ROM: full    Dental No notable dental hx.    Pulmonary  clear to auscultation  pulmonary exam normalPulmonary Exam Normal breath sounds clear to auscultation none    Cardiovascular Exercise Tolerance: Good regular Normal    Neuro/Psych Negative Neurological ROS  Negative Psych ROS  GI/Hepatic/Renal negative GI ROS  negative Liver ROS  negative Renal ROS        Endo/Other  Negative Endocrine ROS (+)      Abdominal   Musculoskeletal   Hematology negative hematology ROS (+)   Peds  Reproductive/Obstetrics negative OB ROS    Anesthesia Other Findings Pt. Complaint of tightness in chest, chin itching and sore throat @ 1200 prior to any drug administration. F Jackson back to re-evaluate. Decision to proceed.           Anesthesia Physical Anesthesia Plan  ASA: I  Anesthesia Plan: General   Post-op Pain Management:    Induction:   Airway Management Planned:   Additional Equipment:   Intra-op Plan:   Post-operative Plan:   Informed Consent: I have reviewed the patients History and Physical, chart, labs and discussed the procedure including the risks, benefits and alternatives for the proposed anesthesia with the patient or authorized representative who has indicated his/her understanding and acceptance.   Dental Advisory Given  Plan Discussed with: CRNA and Surgeon  Anesthesia Plan Comments:         Anesthesia Quick Evaluation

## 2010-12-17 ENCOUNTER — Telehealth: Payer: Self-pay | Admitting: *Deleted

## 2010-12-17 ENCOUNTER — Encounter: Payer: Self-pay | Admitting: Gynecology

## 2010-12-17 ENCOUNTER — Other Ambulatory Visit: Payer: Self-pay | Admitting: Gynecology

## 2010-12-17 NOTE — Telephone Encounter (Signed)
PT CALLED WANTING TO KNOW IF DR TF FOUND ANYTHING FROM HER SURGERY ON 12/16/10. I TOLD PT IF MD FINDS ANYTHING SHE WILL BE CONTACTED,

## 2010-12-20 ENCOUNTER — Encounter (HOSPITAL_COMMUNITY): Payer: Self-pay | Admitting: Gynecology

## 2010-12-23 NOTE — Anesthesia Postprocedure Evaluation (Signed)
Anesthesia Post Note  Patient: Emma Robbins, Emma Robbins  Procedure(s) Performed:  LAPAROSCOPY DIAGNOSTIC - LAP OVARIAN CYSTECTOMY IS HOW PROCEDURE READS/LOOKING FOR 7:30A OR 1:00PM TIME  Anesthesia type: General  Patient location: PACU  Post pain: Pain level controlled  Post assessment: Post-op Vital signs reviewed  Last Vitals:  Filed Vitals:   12/16/10 1535  BP:   Pulse: 73  Temp: 98.3 F (36.8 C)  Resp: 16    Post vital signs: Reviewed  Level of consciousness: sedated  Complications: No apparent anesthesia complications

## 2010-12-25 ENCOUNTER — Ambulatory Visit (INDEPENDENT_AMBULATORY_CARE_PROVIDER_SITE_OTHER): Payer: Self-pay | Admitting: Gynecology

## 2010-12-25 ENCOUNTER — Encounter: Payer: Self-pay | Admitting: Gynecology

## 2010-12-25 VITALS — BP 110/70

## 2010-12-25 DIAGNOSIS — Z09 Encounter for follow-up examination after completed treatment for conditions other than malignant neoplasm: Secondary | ICD-10-CM

## 2010-12-25 NOTE — Progress Notes (Signed)
Patient presents postop status post laparoscopic right ovarian cystectomy with final pathology showing a serous cystadenofibroma. She's done well without complaints.  Exam Abdomen incision sites all healing nicely still with sutures in place. I removed all of her sutures.  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 She's done well postoperative will resume all normal activities and followup next summer for her annual exam sooner as needed.

## 2010-12-29 LAB — URINE CULTURE

## 2010-12-29 LAB — URINALYSIS, ROUTINE W REFLEX MICROSCOPIC
Bilirubin Urine: NEGATIVE
Nitrite: NEGATIVE
Specific Gravity, Urine: 1.044 — ABNORMAL HIGH
Urobilinogen, UA: 0.2

## 2010-12-29 LAB — PREGNANCY, URINE: Preg Test, Ur: NEGATIVE

## 2010-12-29 LAB — URINE MICROSCOPIC-ADD ON

## 2010-12-29 LAB — RAPID STREP SCREEN (MED CTR MEBANE ONLY): Streptococcus, Group A Screen (Direct): NEGATIVE

## 2011-01-11 ENCOUNTER — Encounter: Payer: Self-pay | Admitting: Gynecology

## 2011-01-11 ENCOUNTER — Ambulatory Visit (INDEPENDENT_AMBULATORY_CARE_PROVIDER_SITE_OTHER): Payer: Self-pay | Admitting: Gynecology

## 2011-01-11 DIAGNOSIS — N949 Unspecified condition associated with female genital organs and menstrual cycle: Secondary | ICD-10-CM

## 2011-01-11 DIAGNOSIS — N390 Urinary tract infection, site not specified: Secondary | ICD-10-CM

## 2011-01-11 DIAGNOSIS — R102 Pelvic and perineal pain: Secondary | ICD-10-CM

## 2011-01-11 DIAGNOSIS — R82998 Other abnormal findings in urine: Secondary | ICD-10-CM

## 2011-01-11 LAB — POCT URINE PREGNANCY: Preg Test, Ur: NEGATIVE

## 2011-01-11 NOTE — Progress Notes (Signed)
Patient presents complaining of a three-day history of lower pelvic discomfort. Patient notes bilateral pain cramping in nature although she also notes some periumbilical discomfort. She has no nausea vomiting diarrhea constipation no urinary symptoms such as frequency dysuria or urgency. Her last menstrual period was approximated month ago she had been on oral contraceptives but stopped them wasn't sure that she was to continue them. She is sexually active.  Exam Abdomen soft nontender without masses guarding rebound organomegaly Pelvic external BUS vagina normal, cervix normal, uterus normal size midline mobile nontender adnexa without masses or tenderness  Assessment and plan: Pelvic pain check UPT rule out early pregnancy event. Await menses over the next week or so. I suspect this is probably physiologic in nature. I discussed again the birth control pills. She essentially active and she is at risk for pregnancy. She wants to think about whether she wants to restart them. If she chooses to start the pills I asked her to wait until after heher menses and then start the Sunday following. If she decides against this then again she realizes she is at risk for pregnancy. Urinalysis is low-grade positive. Will await culture results and treat accordingly as she's not having any urinary symptoms at this time. If her pain persists and she is to call we'll start with pelvic ultrasound rule out nonpalpable abnormalities.

## 2011-01-11 NOTE — Patient Instructions (Signed)
Call if pain persists

## 2011-01-12 MED ORDER — CIPROFLOXACIN HCL 250 MG PO TABS: 250.0000 mg | ORAL_TABLET | Freq: Two times a day (BID) | ORAL | Status: AC

## 2011-01-12 NOTE — Progress Notes (Signed)
Addended by: Dara Lords on: 01/12/2011 08:59 AM   Modules accepted: Orders

## 2011-01-19 ENCOUNTER — Emergency Department (HOSPITAL_COMMUNITY)
Admission: EM | Admit: 2011-01-19 | Discharge: 2011-01-20 | Disposition: A | Discharge status: Home / Self Care | Payer: No Typology Code available for payment source | Attending: Emergency Medicine | Admitting: Emergency Medicine

## 2011-01-19 ENCOUNTER — Emergency Department (HOSPITAL_COMMUNITY): Payer: No Typology Code available for payment source

## 2011-01-19 ENCOUNTER — Emergency Department (HOSPITAL_COMMUNITY): Payer: Self-pay

## 2011-01-19 DIAGNOSIS — M542 Cervicalgia: Secondary | ICD-10-CM | POA: Insufficient documentation

## 2011-01-19 DIAGNOSIS — M25569 Pain in unspecified knee: Secondary | ICD-10-CM | POA: Insufficient documentation

## 2011-01-19 DIAGNOSIS — M79609 Pain in unspecified limb: Secondary | ICD-10-CM | POA: Insufficient documentation

## 2011-01-19 DIAGNOSIS — M549 Dorsalgia, unspecified: Secondary | ICD-10-CM | POA: Insufficient documentation

## 2011-04-27 ENCOUNTER — Encounter: Payer: Self-pay | Admitting: Gynecology

## 2011-04-27 ENCOUNTER — Ambulatory Visit (INDEPENDENT_AMBULATORY_CARE_PROVIDER_SITE_OTHER): Payer: Self-pay | Admitting: Gynecology

## 2011-04-27 VITALS — Temp 97.8°F

## 2011-04-27 DIAGNOSIS — R319 Hematuria, unspecified: Secondary | ICD-10-CM

## 2011-04-27 DIAGNOSIS — N949 Unspecified condition associated with female genital organs and menstrual cycle: Secondary | ICD-10-CM

## 2011-04-27 DIAGNOSIS — R102 Pelvic and perineal pain: Secondary | ICD-10-CM

## 2011-04-27 DIAGNOSIS — N63 Unspecified lump in unspecified breast: Secondary | ICD-10-CM

## 2011-04-27 LAB — URINALYSIS W MICROSCOPIC + REFLEX CULTURE
Bilirubin Urine: NEGATIVE
Ketones, ur: NEGATIVE mg/dL
Protein, ur: NEGATIVE mg/dL
Urobilinogen, UA: 1 mg/dL (ref 0.0–1.0)

## 2011-04-27 NOTE — Progress Notes (Signed)
Addended by: Richardson Chiquito on: 04/27/2011 02:32 PM   Modules accepted: Orders

## 2011-04-27 NOTE — Patient Instructions (Signed)
Follow up for breast ultrasound, GI referral and GYN ultrasound.

## 2011-04-27 NOTE — Progress Notes (Signed)
Patient presents with 2 issues #1 abdominal pain. Patient is a last several months fleeting abdominal pain comes and goes throughout the month sometimes upper abdominal following eating with nausea other times pelvic primarily on the left side. Does not last long does not seem associated with her menses.  She also notes deep dyspareunia on a consistent basis and just hurts throughout the pelvis after intercourse no specific pinpoint area. Her last menstrual period started 4 days ago. She had been on oral contraceptives but stopped them in November said she didn't like the way she felt above is associated with this pain but then restarted them transiently in December for week and then stop them again. #2 not sure she feels a lump in her right breast at the prior site of her fibroadenoma excision. It's tender to touch.  Exam with Selena Batten chaperone present Breasts examined lying and sitting. Left without masses retractions discharge adenopathy. Right breast with fibrous density in the site of her prior fibroadenoma excision in the tail of Spence no definitive masses but denser tissue. No axillary adenopathy, nipple discharge or masses palpated otherwise. Pelvic external BUS vagina with light staining. Cervix normal. Uterus normal size midline mobile nontender. Adnexa without masses or tenderness  Assessment and plan: 1. Abdominal pain. Sometimes upper abdominal and other times lower abdominal. She does have a history of a laparoscopic right serous cystadenofibroma excision in July. She also does have a history of GI issues in the past to include anorexia nervosa. Will start with ultrasound of the pelvis and referral to GI for evaluation. I did a GC and Chlamydia screen also of her cervix. Discussed possible laparoscopy ultimately if pain persists from a pelvic standpoint but I would want GI to see her first to make sure that this isn't all GI related. 2. Breast density. 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 We'll start with breast ultrasound and then go from there. May refer back to Dr. Claud Kelp who did her original surgery will start with the ultrasound. 3. Birth control. Reviewed options for birth control to include pill patch ring, Depo-Provera, Implanon, Mirena IUD. I strongly encouraged her to use some form of contraception. My preference would be oral contraceptives as she was not having a problem remembering them and get back on them consistently to suppress her discomfort also. Patient wants to think of her options. She'll follow up with me for her ultrasound and then we'll go from there.

## 2011-04-27 NOTE — Progress Notes (Signed)
Addended by: Rushie Goltz on: 04/27/2011 04:41 PM   Modules accepted: Orders

## 2011-04-28 LAB — GC/CHLAMYDIA PROBE AMP, GENITAL: Chlamydia, DNA Probe: NEGATIVE

## 2011-04-29 LAB — URINE CULTURE: Colony Count: 50000

## 2011-05-03 ENCOUNTER — Encounter: Payer: Self-pay | Admitting: Internal Medicine

## 2011-05-03 ENCOUNTER — Telehealth: Payer: Self-pay | Admitting: *Deleted

## 2011-05-03 ENCOUNTER — Other Ambulatory Visit: Payer: Self-pay | Admitting: *Deleted

## 2011-05-03 DIAGNOSIS — R922 Inconclusive mammogram: Secondary | ICD-10-CM

## 2011-05-03 NOTE — Telephone Encounter (Signed)
Message copied by Libby Maw on Mon May 03, 2011  9:50 AM ------      Message from: Dara Lords      Created: Tue Apr 27, 2011 12:50 PM       #1  Schedule right breast ultrasound. History of prior fibroadenoma excision with fibrous feeling density in right tail of Spence at prior excision site.      #2 schedule GI evaluation with [facility] in reference to abdominal pain with nausea suspect irritable bowel

## 2011-05-03 NOTE — Telephone Encounter (Signed)
Patient informed appt set up with [facility] Dr. Rhea Belton on 05/10/11 @ 3:30pm.  Called to make appt for breast u/s and [facility] said for patient to call to qualify for patient assistance at [phone].  This is a program for breast imaging that helps for patients with no insurance and they will call the patient to schedule the appt.  Patient given all information.

## 2011-05-04 ENCOUNTER — Encounter: Payer: Self-pay | Admitting: *Deleted

## 2011-05-04 NOTE — Progress Notes (Signed)
Patient ID: Emma Robbins, female   DOB: 04/05/1990, 20 y.o.   MRN: [id number] Pt called stating that [facility], [facility] also [facility] at the [facility] center scheduled. I told pt to call [facility] assistance number and tell them about both appointments.

## 2011-05-05 ENCOUNTER — Encounter: Payer: Self-pay | Admitting: Gynecology

## 2011-05-05 ENCOUNTER — Ambulatory Visit (INDEPENDENT_AMBULATORY_CARE_PROVIDER_SITE_OTHER): Payer: Self-pay

## 2011-05-05 ENCOUNTER — Ambulatory Visit (INDEPENDENT_AMBULATORY_CARE_PROVIDER_SITE_OTHER): Payer: Self-pay | Admitting: Gynecology

## 2011-05-05 ENCOUNTER — Encounter: Payer: Self-pay | Admitting: Internal Medicine

## 2011-05-05 DIAGNOSIS — R102 Pelvic and perineal pain: Secondary | ICD-10-CM

## 2011-05-05 DIAGNOSIS — R1032 Left lower quadrant pain: Secondary | ICD-10-CM

## 2011-05-05 DIAGNOSIS — IMO0002 Reserved for concepts with insufficient information to code with codable children: Secondary | ICD-10-CM

## 2011-05-05 DIAGNOSIS — N831 Corpus luteum cyst of ovary, unspecified side: Secondary | ICD-10-CM

## 2011-05-05 DIAGNOSIS — R109 Unspecified abdominal pain: Secondary | ICD-10-CM

## 2011-05-05 DIAGNOSIS — N949 Unspecified condition associated with female genital organs and menstrual cycle: Secondary | ICD-10-CM

## 2011-05-05 NOTE — Progress Notes (Signed)
Patient follows up for her ultrasound due to her pelvic pain dyspareunia. Her ultrasound shows endometrial echo 6.2 mm. Homogeneous myometrial pattern. Right left ovaries with physiologic changes. Cul-de-sac negative. Reviewed findings with the patient. I think that her abdominal pain is more GI related and recommended she follow up with gastroenterology in reference to that pain. As far as her dyspareunia. If that continues the next option would be laparoscopy and she will follow up if it continues. I asked her about birth control and she is thinking of the Implanon. I reviewed was involved with the placement procedure and the risks to include infection requiring treatment and removal. Injury to the underlying neurovascular structures. Migration and difficulty removal. I also reviewed the side effect profile to include prolonged irregular bleeding and the risk of failure with pregnancy.  The need for placement while on a normal menses was also discussed.

## 2011-05-05 NOTE — Patient Instructions (Signed)
Follow up for Nexplanon as discussed. Will consider laparoscopy if your pelvic pain or pain with intercourse continues.

## 2011-05-10 ENCOUNTER — Encounter: Payer: Self-pay | Admitting: Internal Medicine

## 2011-05-10 ENCOUNTER — Ambulatory Visit: Payer: Self-pay | Admitting: Internal Medicine

## 2011-05-10 VITALS — BP 118/64 | HR 94 | Ht 64.0 in | Wt 166.0 lb

## 2011-05-10 DIAGNOSIS — R1013 Epigastric pain: Secondary | ICD-10-CM

## 2011-05-10 DIAGNOSIS — R112 Nausea with vomiting, unspecified: Secondary | ICD-10-CM

## 2011-05-10 MED ORDER — ESOMEPRAZOLE MAGNESIUM 40 MG PO CPDR: 40.0000 mg | DELAYED_RELEASE_CAPSULE | Freq: Every day | ORAL | Status: DC

## 2011-05-10 NOTE — Patient Instructions (Signed)
Please take Nexium 40 mg samples one tablet by mouth once daily until finished. Then take Prilosec (ompeprazole) samples until finished and switch to over the counter omeprazole and continue once daily.  Call us back to give [place] a update on your symptoms in one month.

## 2011-05-10 NOTE — Progress Notes (Signed)
Subjective:    Patient ID: Emma Robbins, female    DOB: July 21, 1990, 20 y.o.   MRN: [id number]  HPI Emma Robbins is a 21 yo female with little PMH who is seen for evaluation of epigastric abdominal pain.  The patient reports about one month of epigastric abdominal pain associated with nausea and vomiting. This is been going on approximately one month. She is having pain on a daily basis, along with nausea but not necessarily vomiting daily. She denies hematemesis. The pain is brought on by eating and drinking. No fevers or chills. No melena or bright red blood per rectum. Her appetite is okay, she notes an approximate 4 pound weight loss. She denies alcohol use. She notes some bloating.  Review of Systems As per HPI, otherwise neg  Past Medical History  Diagnosis Date  . Abdominal pain   . Ovarian cyst   . Fatigue   . Hemorrhoids   . Anxiety    Past Surgical History  Procedure Date  . Right breast lumpectomy 2012    fibroadenoma  . Laparoscopic ovarian cystectomy 9.19.12    SEROUS CYSTADENOFIBROMA.   Current Outpatient Prescriptions  Medication Sig Dispense Refill  . ibuprofen (ADVIL,MOTRIN) 200 MG tablet Take 200 mg by mouth every 6 (six) hours as needed. For pain      . esomeprazole (NEXIUM) 40 MG capsule Take 1 capsule (40 mg total) by mouth daily.  30 capsule  0   Allergies  Allergen Reactions  . Sugar-Protein-Starch Nausea And Vomiting    Pt states that sweet tea makes her vomit.   Family History  Problem Relation Age of Onset  . Hypertension Maternal Grandmother   . Diabetes Paternal Grandfather   . Hyperlipidemia Father   . Thyroid disease Brother   . Colon cancer Neg Hx    History   Social History  . Marital Status: Married    Spouse Name: N/A    Number of Children: N/A  . Years of Education: N/A   Social History Main Topics  . Smoking status: Never Smoker   . Smokeless tobacco: Never Used  . Alcohol Use: No  . Drug Use: No  .  Sexually Active: Yes   Other Topics Concern  . None   Social History Narrative  . None        Objective:   Physical Exam BP 118/64  Pulse 94  Ht 5\' 4"  (1.626 m)  Wt 166 lb (75.297 kg)  BMI 28.49 kg/m2  LMP 04/24/2011 Constitutional: Well-developed and well-nourished. No distress. HEENT: Normocephalic and atraumatic. Oropharynx is clear and moist. No oropharyngeal exudate. Conjunctivae are normal. Pupils are equal round and reactive to light. No scleral icterus. Neck: Neck supple. Trachea midline. Cardiovascular: Normal rate, regular rhythm and intact distal pulses. No M/R/G Pulmonary/chest: Effort normal and breath sounds normal. No wheezing, rales or rhonchi. Abdominal: Soft, mild epigastric abdominal pain to deep palpation without rebound or guarding, nondistended. Bowel sounds active throughout. There are no masses palpable. No hepatosplenomegaly. Extremities: no clubbing, cyanosis, or edema Lymphadenopathy: No cervical adenopathy noted. Neurological: Alert and oriented to person place and time. Skin: Skin is warm and dry. No rashes noted. Psychiatric: Normal mood and affect. Behavior is normal.     Assessment & Plan:  This is a 21 year old female with an approximate one-month history of epigastric abdominal pain associated with nausea and vomiting which is worse after eating.  1. Epigastric pain/nausea/vomiting -- the patient's pain could be related to either peptic ulcer disease or acid  related gastritis, or perhaps gallbladder dysfunction.  She has limited resources, and therefore would like to try to avoid expensive test if possible. I think it is reasonable to give her a trial of PPI therapy once daily. I've advised her to take this 30 minutes to one hour before her first meal. She is given samples of Nexium 40 mg daily, and when this runs out will start omeprazole 20 mg daily.  I like her to take this for one month, and call me and let us know how she is doing.  If her  symptoms resolve, nothing further is necessary, however if she fails to improve, and she will likely need further testing likely starting with EGD. We also need to consider abdominal ultrasound to evaluate her gallbladder.  Will wait to hear from her in one month's time.

## 2011-05-18 ENCOUNTER — Ambulatory Visit: Payer: Self-pay

## 2011-06-04 ENCOUNTER — Ambulatory Visit: Payer: Self-pay

## 2011-10-20 ENCOUNTER — Inpatient Hospital Stay (HOSPITAL_COMMUNITY)
Admission: AD | Admit: 2011-10-20 | Discharge: 2011-10-21 | Disposition: A | Discharge status: Home / Self Care | Payer: Self-pay | Source: Ambulatory Visit | Attending: Obstetrics & Gynecology | Admitting: Obstetrics & Gynecology

## 2011-10-20 ENCOUNTER — Encounter (HOSPITAL_COMMUNITY): Payer: Self-pay | Admitting: *Deleted

## 2011-10-20 DIAGNOSIS — R109 Unspecified abdominal pain: Secondary | ICD-10-CM

## 2011-10-20 DIAGNOSIS — N72 Inflammatory disease of cervix uteri: Secondary | ICD-10-CM | POA: Insufficient documentation

## 2011-10-20 DIAGNOSIS — N949 Unspecified condition associated with female genital organs and menstrual cycle: Secondary | ICD-10-CM | POA: Insufficient documentation

## 2011-10-20 DIAGNOSIS — R1013 Epigastric pain: Secondary | ICD-10-CM | POA: Insufficient documentation

## 2011-10-20 LAB — CBC
HCT: 38.9 % (ref 36.0–46.0)
MCV: 92.2 fL (ref 78.0–100.0)
RDW: 12.2 % (ref 11.5–15.5)
WBC: 7.5 10*3/uL (ref 4.0–10.5)

## 2011-10-20 LAB — URINALYSIS, ROUTINE W REFLEX MICROSCOPIC
Bilirubin Urine: NEGATIVE
Hgb urine dipstick: NEGATIVE
Ketones, ur: NEGATIVE mg/dL
Protein, ur: NEGATIVE mg/dL
Specific Gravity, Urine: 1.015 (ref 1.005–1.030)
Urobilinogen, UA: 4 mg/dL — ABNORMAL HIGH (ref 0.0–1.0)

## 2011-10-20 LAB — LIPASE, BLOOD: Lipase: 20 U/L (ref 11–59)

## 2011-10-20 LAB — WET PREP, GENITAL: Yeast Wet Prep HPF POC: NONE SEEN

## 2011-10-20 LAB — COMPREHENSIVE METABOLIC PANEL
Albumin: 4.2 g/dL (ref 3.5–5.2)
BUN: 13 mg/dL (ref 6–23)
Chloride: 101 mEq/L (ref 96–112)
Creatinine, Ser: 0.81 mg/dL (ref 0.50–1.10)
GFR calc non Af Amer: >90 mL/min (ref >90)
Total Bilirubin: 0.8 mg/dL (ref 0.3–1.2)

## 2011-10-20 LAB — AMYLASE: Amylase: 71 U/L (ref 0–105)

## 2011-10-20 MED ORDER — AZITHROMYCIN 250 MG PO TABS
1000.0000 mg | ORAL_TABLET | Freq: Once | ORAL | Status: AC
  Administered 2011-10-20: 1000 mg via ORAL
  Filled 2011-10-20: qty 4

## 2011-10-20 MED ORDER — AZITHROMYCIN 1 G PO PACK: 1.0000 g | PACK | Freq: Once | ORAL | Status: DC

## 2011-10-20 MED ORDER — CEFTRIAXONE SODIUM 250 MG IJ SOLR
250.0000 mg | Freq: Once | INTRAMUSCULAR | Status: AC
  Administered 2011-10-20: 250 mg via INTRAMUSCULAR
  Filled 2011-10-20: qty 250

## 2011-10-20 NOTE — Progress Notes (Signed)
Pt states she get nauseous when she gets dizzy and she has been dizzy all day today

## 2011-10-20 NOTE — MAU Note (Signed)
Pt states she started feeling weak for the past 2 weeks and pt states she has "a lot of anxiety on her feet and arms" Pt states she feels restless

## 2011-10-20 NOTE — MAU Note (Signed)
Patient state she has been having headaches off and on, upper abdominal pain and some nausea and vomiting with eating, feeling weak for about 2 weeks. Had a breast lump removed on the right breast in September 2012 and states it is back and painful.

## 2011-10-20 NOTE — Progress Notes (Signed)
Pt states she has a lot of pain in her upper abdomen when she eats. Pt states she feels pain in her ovaries when she carries her 21yo niece

## 2011-10-20 NOTE — MAU Provider Note (Signed)
History     CSN: [id number]  Arrival date and time: 10/20/11 1821   First Provider Initiated Contact with Patient 10/20/11 1958      Chief Complaint  Patient presents with  . Possible Pregnancy  . Headache  . Abdominal Pain   HPIBeatriz Mamie Robbins is a 21 y.o. female. She is not pregnant. She presents with abdominal pain since 1 to 2 weeks.  She complains of pain that comes and goes and is worse after she eats.  Pt also complains that her "ovaries hurt" because she has bilateral lower abdominal pain.  She has not taken anything for the pain.  Pt has a history of ovarian serous cystadnofibroma She also complains of being dizzy and headache.  She has not taken anything for pain.  She denies vaginal discharge or abnormal bleeding.  Pt is late for her period and concerned she may be pregnant. She also has a right upper quadrant pain which is worse after she eats.  She  has previously taken Nexium but is not presently taking.  Pt denies constipation, diarrhea, UTI symptoms. Pt also complains of right breat tenderness with a palpable density for one week. Pt has a history of surgical excision or right breast fibroadenoma.   Pt saw Dr. Audie Box on 05/05/2011 for abdominal pain and dyspareunia with normal ultrasound and referred to GI Dr. Rhea Belton and was seen on 05/10/2011 and started on Nexium- offered additional studies but due to lack of financial resources declined.  Pt was advised if symptoms continued she would need EGD and gall bladder ultrasound  Past Medical History  Diagnosis Date  . Abdominal pain   . Ovarian cyst   . Fatigue   . Hemorrhoids   . Anxiety     Past Surgical History  Procedure Date  . Right breast lumpectomy 2012    fibroadenoma  . Laparoscopic ovarian cystectomy 9.19.12    SEROUS CYSTADENOFIBROMA.    Family History  Problem Relation Age of Onset  . Hypertension Maternal Grandmother   . Diabetes Paternal Grandfather   . Hyperlipidemia Father   . Thyroid  disease Brother   . Colon cancer Neg Hx     History  Substance Use Topics  . Smoking status: Never Smoker   . Smokeless tobacco: Never Used  . Alcohol Use: No    Allergies:  Allergies  Allergen Reactions  . Sugar-Protein-Starch Nausea And Vomiting    Pt states that sweet tea makes her vomit.    Prescriptions prior to admission  Medication Sig Dispense Refill  . esomeprazole (NEXIUM) 40 MG capsule Take 1 capsule (40 mg total) by mouth daily.  30 capsule  0  . ibuprofen (ADVIL,MOTRIN) 200 MG tablet Take 200 mg by mouth every 6 (six) hours as needed. For pain        ROS: As stated in HPI   Physical Exam  Constitutional: She is oriented to person, place, and time. She appears well-developed and well-nourished. No distress.  HENT:  Head: Normocephalic and atraumatic.  Eyes: EOM are normal.  Neck: Neck supple.  Cardiovascular: Normal rate.   Respiratory: Effort normal.  GI: Soft. Normal appearance. There is tenderness in the epigastric area, left upper quadrant and left lower quadrant. There is no rigidity, no rebound, no guarding and no CVA tenderness.  Genitourinary:       External genitalia without lesions. White discharge vaginal vault. Cervix inflamed, positive CMT, mildly tender bilateral adnexa. Uterus without palpable enlargement.  Musculoskeletal: Normal range of motion.  Neurological:  She is alert and oriented to person, place, and time.  Skin: Skin is warm and dry.  Psychiatric: She has a normal mood and affect. Her behavior is normal. Judgment and thought content normal.     Procedures Pt denied pain med Care turned over to [facility], NP   [facility] 10/20/2011, 7:59 PM    Exam BP 114/70  Pulse 97  Temp 99.4 F (37.4 C) (Oral)  Resp 16  Ht 5\' 4"  (1.626 m)  Wt 179 lb (81.194 kg)  BMI 30.73 kg/m2  SpO2 100%  LMP 09/16/2011   Emma Robbins  Is a 21 y.o.  Female. She is alert and oriented in no acute distress. Abdomen is soft, tender  with palpation,epigastric,  LUQ and LLQ. No guarding or rebound. Pelvic exam, external genitalia without lesions. White discharge vaginal vault. Cervix inflamed. Positive CMT, no adnexal tenderness or mass palpated.   Results for orders placed during the hospital encounter of 10/20/11 (from the past 24 hour(s))  URINALYSIS, ROUTINE W REFLEX MICROSCOPIC     Status: Abnormal   Collection Time   10/20/11  6:40 PM      Component Value Range   Color, Urine YELLOW  YELLOW   APPearance HAZY (*) CLEAR   Specific Gravity, Urine 1.015  1.005 - 1.030   pH 8.0  5.0 - 8.0   Glucose, UA NEGATIVE  NEGATIVE mg/dL   Hgb urine dipstick NEGATIVE  NEGATIVE   Bilirubin Urine NEGATIVE  NEGATIVE   Ketones, ur NEGATIVE  NEGATIVE mg/dL   Protein, ur NEGATIVE  NEGATIVE mg/dL   Urobilinogen, UA 4.0 (*) 0.0 - 1.0 mg/dL   Nitrite NEGATIVE  NEGATIVE   Leukocytes, UA NEGATIVE  NEGATIVE  POCT PREGNANCY, URINE     Status: Normal   Collection Time   10/20/11  6:46 PM      Component Value Range   Preg Test, Ur NEGATIVE  NEGATIVE  CBC     Status: Normal   Collection Time   10/20/11  8:25 PM      Component Value Range   WBC 7.5  4.0 - 10.5 K/uL   RBC 4.22  3.87 - 5.11 MIL/uL   Hemoglobin 13.5  12.0 - 15.0 g/dL   HCT 16.1  09.6 - 04.5 %   MCV 92.2  78.0 - 100.0 fL   MCH 32.0  26.0 - 34.0 pg   MCHC 34.7  30.0 - 36.0 g/dL   RDW 40.9  81.1 - 91.4 %   Platelets 218  150 - 400 K/uL  COMPREHENSIVE METABOLIC PANEL     Status: Abnormal   Collection Time   10/20/11  8:25 PM      Component Value Range   Sodium 136  135 - 145 mEq/L   Potassium 3.8  3.5 - 5.1 mEq/L   Chloride 101  96 - 112 mEq/L   CO2 26  19 - 32 mEq/L   Glucose, Bld 86  70 - 99 mg/dL   BUN 13  6 - 23 mg/dL   Creatinine, Ser 7.82  0.50 - 1.10 mg/dL   Calcium 9.6  8.4 - 95.6 mg/dL   Total Protein 6.6  6.0 - 8.3 g/dL   Albumin 4.2  3.5 - 5.2 g/dL   AST 28  0 - 37 U/L   ALT 42 (*) 0 - 35 U/L   Alkaline Phosphatase 74  39 - 117 U/L   Total Bilirubin 0.8   0.3 - 1.2 mg/dL   GFR calc  non Af Amer >90  >90 mL/min   GFR calc Af Amer >90  >90 mL/min  AMYLASE     Status: Normal   Collection Time   10/20/11  8:25 PM      Component Value Range   Amylase 71  0 - 105 U/L  LIPASE, BLOOD     Status: Normal   Collection Time   10/20/11  8:25 PM      Component Value Range   Lipase 20  11 - 59 U/L  WET PREP, GENITAL     Status: Abnormal   Collection Time   10/20/11  8:37 PM      Component Value Range   Yeast Wet Prep HPF POC NONE SEEN  NONE SEEN   Trich, Wet Prep NONE SEEN  NONE SEEN   Clue Cells Wet Prep HPF POC NONE SEEN  NONE SEEN   WBC, Wet Prep HPF POC MANY (*) NONE SEEN   ALT slightly elevated, otherwise normal labs.   Patient states she is feeling a little weak and dizzy. Has not eaten anything today except a mango. Given peanut butter crackers. Problem list includes Bulimia Nervosa  Assessment:  Epigastric pain   Cervicitis   Pelvic pain  Plan:  Rocephin 250 mg IM   Zithromax 1 gram po   Encouraged patient to take the Nexium prescribed by the GI doctor   Follow up with GI and with GYN   Return here as needed. I have reviewed this patient's vital signs, nurses notes and appropriate labs. I have discussed the clinical findings, lab results and plan of care with the patient and her family and they voice understanding.

## 2011-10-21 ENCOUNTER — Telehealth: Payer: Self-pay | Admitting: Advanced Practice Midwife

## 2011-10-21 NOTE — Progress Notes (Signed)
Pt given crackers and peanut butter by the NP

## 2011-10-21 NOTE — Telephone Encounter (Signed)
Pt called MAU stating she was supposed to have been referred to [facility] for Cholecystectomy after MAU visit 10/20/11. States she called [facility] and was told there was not referral made. Reviewed provider note and discussed referral w/ Kerrie Buffalo, NP who saw the pt at that visit. Gall Bladder US not done due to pt having eaten and non-emergent Sx. Pt was NOT referred to [facility], but was instructed to F/U w/ her Gastroenterologist, Dr. Rhea Belton for possible Gall Bladder US and further management. Pt verbalizes understanding.

## 2011-10-21 NOTE — Telephone Encounter (Signed)
Pt has been given an appt with Dr Jamey Ripa on 11/16/11 for evaluation of Gallstones. Dr Rhea Belton, do we still need to f/u for original complaints? Thanks.

## 2011-10-21 NOTE — Telephone Encounter (Signed)
We can wait until after surgical opinion, if symptoms persist. thanks

## 2011-10-21 NOTE — Telephone Encounter (Signed)
After her last clinic visit in February 2013 the patient was supposed to call us to update [place] on her symptoms after trial of PPI. We did not hear from her. We will schedule office visit for followup and further management Emma Robbins please schedule OV

## 2011-11-04 ENCOUNTER — Telehealth (INDEPENDENT_AMBULATORY_CARE_PROVIDER_SITE_OTHER): Payer: Self-pay | Admitting: General Surgery

## 2011-11-04 NOTE — Telephone Encounter (Signed)
Spoke with patient re: her appt here for her gallbladder evaluation on 11/16/11. Patient has not has an ultrasound or a workup for her gallbladder. I advised without this workup we could not see the patient. She will contact Dr Lauro Franklin office about scheduling these tests prior to her appt on 11/16/11 with Dr Jamey Ripa.

## 2011-11-09 ENCOUNTER — Telehealth: Payer: Self-pay | Admitting: *Deleted

## 2011-11-09 ENCOUNTER — Telehealth: Payer: Self-pay | Admitting: Internal Medicine

## 2011-11-09 DIAGNOSIS — R109 Unspecified abdominal pain: Secondary | ICD-10-CM

## 2011-11-09 NOTE — Telephone Encounter (Signed)
Yes, she needs [place] prior to any surgical eval.  Gallbladder US r/o stones.

## 2011-11-09 NOTE — Telephone Encounter (Signed)
Informed pt of U/S appt for 11/11/11, arrive at 0815am at 1st flr radiology at [facility]; NPO after midnight. Pt stated understanding. Informed Jade with Dr Jamey Ripa of test.

## 2011-11-09 NOTE — Telephone Encounter (Signed)
Lesly Rubenstein, CMA with Dr Jamey Ripa called to question appt for pt. Pt has never been tested, U/S or CT, for proof of gallstones. On OV on 05/10/11, she did not have money to pursue an U/S and was to call us back. She did call in July, but I saw the surgical appt with [facility] and we decided to see her after the referral. Can we order an U/S; Dr Jamey Ripa has no validation of stones? Thanks

## 2011-11-09 NOTE — Telephone Encounter (Signed)
Spoke with Aram Beecham at [place] who ordered US for patient. She is scheduled for 11/11/11. Will keep scheduled appt.

## 2011-11-11 ENCOUNTER — Ambulatory Visit (HOSPITAL_COMMUNITY)
Admission: RE | Admit: 2011-11-11 | Discharge: 2011-11-11 | Disposition: A | Discharge status: Home / Self Care | Payer: Self-pay | Source: Ambulatory Visit | Attending: Internal Medicine | Admitting: Internal Medicine

## 2011-11-11 DIAGNOSIS — R109 Unspecified abdominal pain: Secondary | ICD-10-CM | POA: Insufficient documentation

## 2011-11-11 DIAGNOSIS — K8020 Calculus of gallbladder without cholecystitis without obstruction: Secondary | ICD-10-CM | POA: Insufficient documentation

## 2011-11-12 ENCOUNTER — Telehealth: Payer: Self-pay | Admitting: Internal Medicine

## 2011-11-12 NOTE — Telephone Encounter (Signed)
lmom for pt to call back. Dr Jamey Ripa will review and we will call her hopefully on Monday.

## 2011-11-16 ENCOUNTER — Ambulatory Visit (INDEPENDENT_AMBULATORY_CARE_PROVIDER_SITE_OTHER): Payer: Self-pay | Admitting: Surgery

## 2011-11-16 ENCOUNTER — Encounter (INDEPENDENT_AMBULATORY_CARE_PROVIDER_SITE_OTHER): Payer: Self-pay | Admitting: Surgery

## 2011-11-16 VITALS — BP 110/80 | HR 82 | Temp 98.2°F | Ht 64.0 in | Wt 179.2 lb

## 2011-11-16 DIAGNOSIS — K8020 Calculus of gallbladder without cholecystitis without obstruction: Secondary | ICD-10-CM | POA: Insufficient documentation

## 2011-11-16 NOTE — Progress Notes (Signed)
NAME: Emma Robbins DOB: 10/28/1990 MRN: [id number]                                                                                      DATE: 11/16/2011  PCP: FONTAINE,TIMOTHY P, MD Referring Provider: No ref. provider found  IMPRESSION:  Symptomatic gallstones  PLAN:   Laparoscopic cholecystectomy and cholangiogram I have discussed the indications for laparoscopic cholecystectomy with her and provided educational material. We have discussed the risks of surgery, including general risks such as bleeding, infection, lung and heart issues etc. We have also discussed the potential for injuries to other organs, bile duct leaks, and other unexpected events. We have also talked about the fact that this may need to be converted to open under certain circumstances. We discussed the typical post op recovery and the fact that there is a good likelihood of improvement in symptoms and return to normal activity.  She understands this and wishes to proceed to schedule surgery. I believe all of her questions have been answered.                    CC:  Chief Complaint  Patient presents with  . Cholelithiasis    HPI:  Emma Robbins is a 20 y.o.  female who presents for evaluation of Gallstones. She has been having intermittent abdominal pain for cervical months. The pain seems to be epigastric and right upper quadrant, also with an hour or 2 of eating. Fatty foods seem to provoke it. Usually it resolves spontaneously but she did have one episode last for several hours. It occasionally wakes her at night. Is often associated with nausea. She also gets a lot of abdominal bloating. Workup included a gallbladder ultrasound shows a single gallstone without evidence of acute cholecystitis. Surgical evaluation was requested.  PMH:  has a past medical history of Abdominal pain; Ovarian cyst; Fatigue; Hemorrhoids; and Anxiety.  PSH:   has past surgical history that includes right  breast lumpectomy (2012); Laparoscopic ovarian cystectomy (9.19.12); and Breast surgery.  ALLERGIES:   Allergies  Allergen Reactions  . Other Nausea And Vomiting    Sweet tea    MEDICATIONS: No current outpatient prescriptions on file.  ROS: She has filled out our 12 point review of systems and it is negative except as HPI, plus problems with headaches. She feels "weak" when she has these episodes. EXAM:   VITAL SIGNS: BP 110/80  Pulse 82  Temp 98.2 F (36.8 C) (Temporal)  Ht 5' 4" (1.626 m)  Wt 179 lb 3.2 oz (81.285 kg)  BMI 30.76 kg/m2  SpO2 98%  LMP 09/16/2011 GENERAL:  The patient is alert, oriented, and generally healthy-appearing, NAD. Mood and affect are normal.  HEENT:  The head is normocephalic, the eyes nonicteric, the pupils were round regular and equal. EOMs are normal. Pharynx normal. Dentition good.  NECK:  The neck is supple and there are no masses or thyromegaly.  LUNGS: Normal respirations and clear to auscultation.  HEART: Regular rhythm, with no murmurs rubs or gallops. Pulses are intact carotid dorsalis pedis and posterior tibial. No significant varicosities are noted.  ABDOMEN: Soft, flat, and nontender. No   masses or organomegaly is noted. No hernias are noted. Bowel sounds are normal.  EXTREMITIES:  Good range of motion, no edema.   DATA REVIEWED:  I have reviewed medical records in electronic record including x-rays and GI notes.    Emma Robbins J 11/16/2011  CC: No ref. provider found, FONTAINE,TIMOTHY P, MD        

## 2011-11-16 NOTE — Telephone Encounter (Signed)
Informed pt of u/s results and that I never heard back from [facility] so she should keep her appt; pt stated understanding.

## 2011-11-16 NOTE — Patient Instructions (Signed)
We will schedule surgery to remove your gallbladder. If you have any questions before surgery please call the office - [phone]

## 2011-11-28 DIAGNOSIS — K8020 Calculus of gallbladder without cholecystitis without obstruction: Secondary | ICD-10-CM

## 2011-11-28 HISTORY — DX: Calculus of gallbladder without cholecystitis without obstruction: K80.20

## 2011-12-02 ENCOUNTER — Encounter (HOSPITAL_BASED_OUTPATIENT_CLINIC_OR_DEPARTMENT_OTHER): Payer: Self-pay | Admitting: *Deleted

## 2011-12-06 ENCOUNTER — Encounter (HOSPITAL_BASED_OUTPATIENT_CLINIC_OR_DEPARTMENT_OTHER): Payer: Self-pay | Admitting: *Deleted

## 2011-12-07 ENCOUNTER — Encounter (HOSPITAL_BASED_OUTPATIENT_CLINIC_OR_DEPARTMENT_OTHER)
Admission: RE | Admit: 2011-12-07 | Discharge: 2011-12-07 | Disposition: A | Discharge status: Home / Self Care | Payer: Self-pay | Source: Ambulatory Visit | Attending: Surgery | Admitting: Surgery

## 2011-12-07 LAB — CBC WITH DIFFERENTIAL/PLATELET
Basophils Absolute: 0 K/uL (ref 0.0–0.1)
Basophils Relative: 1 % (ref 0–1)
Eosinophils Absolute: 0.1 K/uL (ref 0.0–0.7)
Eosinophils Relative: 2 % (ref 0–5)
HCT: 40.8 % (ref 36.0–46.0)
Hemoglobin: 14.1 g/dL (ref 12.0–15.0)
Lymphocytes Relative: 37 % (ref 12–46)
Lymphs Abs: 2.1 K/uL (ref 0.7–4.0)
MCH: 32.1 pg (ref 26.0–34.0)
MCHC: 34.6 g/dL (ref 30.0–36.0)
MCV: 92.9 fL (ref 78.0–100.0)
Monocytes Absolute: 0.4 K/uL (ref 0.1–1.0)
Monocytes Relative: 8 % (ref 3–12)
Neutro Abs: 3 K/uL (ref 1.7–7.7)
Neutrophils Relative %: 53 % (ref 43–77)
Platelets: 258 K/uL (ref 150–400)
RBC: 4.39 MIL/uL (ref 3.87–5.11)
RDW: 12.3 % (ref 11.5–15.5)
WBC: 5.7 K/uL (ref 4.0–10.5)

## 2011-12-07 LAB — COMPREHENSIVE METABOLIC PANEL WITH GFR
ALT: 16 U/L (ref 0–35)
AST: 18 U/L (ref 0–37)
Albumin: 4.4 g/dL (ref 3.5–5.2)
Alkaline Phosphatase: 67 U/L (ref 39–117)
BUN: 12 mg/dL (ref 6–23)
CO2: 29 meq/L (ref 19–32)
Calcium: 9.6 mg/dL (ref 8.4–10.5)
Chloride: 103 meq/L (ref 96–112)
Creatinine, Ser: 0.8 mg/dL (ref 0.50–1.10)
GFR calc Af Amer: >90 mL/min
GFR calc non Af Amer: >90 mL/min
Glucose, Bld: 84 mg/dL (ref 70–99)
Potassium: 3.7 meq/L (ref 3.5–5.1)
Sodium: 140 meq/L (ref 135–145)
Total Bilirubin: 0.8 mg/dL (ref 0.3–1.2)
Total Protein: 7.2 g/dL (ref 6.0–8.3)

## 2011-12-07 LAB — URINALYSIS, ROUTINE W REFLEX MICROSCOPIC
Bilirubin Urine: NEGATIVE
Glucose, UA: NEGATIVE mg/dL
Hgb urine dipstick: NEGATIVE
Protein, ur: NEGATIVE mg/dL
Specific Gravity, Urine: 1.03 (ref 1.005–1.030)

## 2011-12-07 LAB — URINE MICROSCOPIC-ADD ON

## 2011-12-09 ENCOUNTER — Encounter (HOSPITAL_BASED_OUTPATIENT_CLINIC_OR_DEPARTMENT_OTHER)
Admission: RE | Disposition: A | Discharge status: Home / Self Care | Payer: Self-pay | Source: Ambulatory Visit | Attending: Surgery

## 2011-12-09 ENCOUNTER — Encounter (HOSPITAL_BASED_OUTPATIENT_CLINIC_OR_DEPARTMENT_OTHER): Payer: Self-pay | Admitting: Anesthesiology

## 2011-12-09 ENCOUNTER — Telehealth (INDEPENDENT_AMBULATORY_CARE_PROVIDER_SITE_OTHER): Payer: Self-pay | Admitting: General Surgery

## 2011-12-09 ENCOUNTER — Encounter (HOSPITAL_BASED_OUTPATIENT_CLINIC_OR_DEPARTMENT_OTHER): Payer: Self-pay | Admitting: *Deleted

## 2011-12-09 ENCOUNTER — Encounter (INDEPENDENT_AMBULATORY_CARE_PROVIDER_SITE_OTHER): Payer: Self-pay | Admitting: General Surgery

## 2011-12-09 ENCOUNTER — Ambulatory Visit (HOSPITAL_BASED_OUTPATIENT_CLINIC_OR_DEPARTMENT_OTHER): Payer: Self-pay | Admitting: Anesthesiology

## 2011-12-09 ENCOUNTER — Ambulatory Visit (HOSPITAL_BASED_OUTPATIENT_CLINIC_OR_DEPARTMENT_OTHER)
Admission: RE | Admit: 2011-12-09 | Discharge: 2011-12-09 | Disposition: A | Discharge status: Home / Self Care | Payer: Self-pay | Source: Ambulatory Visit | Attending: Surgery | Admitting: Surgery

## 2011-12-09 DIAGNOSIS — K8020 Calculus of gallbladder without cholecystitis without obstruction: Secondary | ICD-10-CM

## 2011-12-09 DIAGNOSIS — K8010 Calculus of gallbladder with chronic cholecystitis without obstruction: Secondary | ICD-10-CM | POA: Insufficient documentation

## 2011-12-09 HISTORY — DX: Headache: R51

## 2011-12-09 HISTORY — DX: Calculus of gallbladder without cholecystitis without obstruction: K80.20

## 2011-12-09 HISTORY — PX: CHOLECYSTECTOMY: SHX55

## 2011-12-09 SURGERY — LAPAROSCOPIC CHOLECYSTECTOMY
Anesthesia: General | Site: Abdomen | Wound class: Contaminated

## 2011-12-09 MED ORDER — OXYCODONE HCL 5 MG/5ML PO SOLN: 5.0000 mg | Freq: Once | ORAL | Status: DC | PRN

## 2011-12-09 MED ORDER — OXYCODONE-ACETAMINOPHEN 5-325 MG PO TABS: 1.0000 | ORAL_TABLET | ORAL | Status: DC | PRN

## 2011-12-09 MED ORDER — CEFAZOLIN SODIUM-DEXTROSE 2-3 GM-% IV SOLR
2.0000 g | INTRAVENOUS | Status: AC
  Administered 2011-12-09: 2 g via INTRAVENOUS

## 2011-12-09 MED ORDER — ACETAMINOPHEN 10 MG/ML IV SOLN
1000.0000 mg | Freq: Once | INTRAVENOUS | Status: AC
  Administered 2011-12-09: 1000 mg via INTRAVENOUS

## 2011-12-09 MED ORDER — ROCURONIUM BROMIDE 100 MG/10ML IV SOLN
INTRAVENOUS | Status: DC | PRN
  Administered 2011-12-09: 40 mg via INTRAVENOUS

## 2011-12-09 MED ORDER — HYDROMORPHONE HCL PF 1 MG/ML IJ SOLN
0.2500 mg | INTRAMUSCULAR | Status: DC | PRN
  Administered 2011-12-09 (×3): 0.5 mg via INTRAVENOUS

## 2011-12-09 MED ORDER — LACTATED RINGERS IV SOLN
INTRAVENOUS | Status: DC
  Administered 2011-12-09 (×2): via INTRAVENOUS

## 2011-12-09 MED ORDER — NEOSTIGMINE METHYLSULFATE 1 MG/ML IJ SOLN
INTRAMUSCULAR | Status: DC | PRN
  Administered 2011-12-09: 3 mg via INTRAVENOUS

## 2011-12-09 MED ORDER — SODIUM CHLORIDE 0.9 % IR SOLN
Status: DC | PRN
  Administered 2011-12-09: 1

## 2011-12-09 MED ORDER — CHLORHEXIDINE GLUCONATE 4 % EX LIQD: 1.0000 "application " | Freq: Once | CUTANEOUS | Status: DC

## 2011-12-09 MED ORDER — OXYCODONE HCL 5 MG PO TABS: 5.0000 mg | ORAL_TABLET | Freq: Once | ORAL | Status: DC | PRN

## 2011-12-09 MED ORDER — GLYCOPYRROLATE 0.2 MG/ML IJ SOLN
INTRAMUSCULAR | Status: DC | PRN
  Administered 2011-12-09: .5 mg via INTRAVENOUS

## 2011-12-09 MED ORDER — MIDAZOLAM HCL 5 MG/5ML IJ SOLN
INTRAMUSCULAR | Status: DC | PRN
  Administered 2011-12-09: 2 mg via INTRAVENOUS

## 2011-12-09 MED ORDER — DEXAMETHASONE SODIUM PHOSPHATE 4 MG/ML IJ SOLN
INTRAMUSCULAR | Status: DC | PRN
  Administered 2011-12-09: 10 mg via INTRAVENOUS

## 2011-12-09 MED ORDER — ONDANSETRON HCL 4 MG/2ML IJ SOLN
INTRAMUSCULAR | Status: DC | PRN
  Administered 2011-12-09 (×2): 4 mg via INTRAVENOUS

## 2011-12-09 MED ORDER — FENTANYL CITRATE 0.05 MG/ML IJ SOLN
INTRAMUSCULAR | Status: DC | PRN
  Administered 2011-12-09 (×2): 50 ug via INTRAVENOUS

## 2011-12-09 MED ORDER — METOCLOPRAMIDE HCL 5 MG/ML IJ SOLN
INTRAMUSCULAR | Status: DC | PRN
  Administered 2011-12-09: 10 mg via INTRAVENOUS

## 2011-12-09 MED ORDER — BUPIVACAINE HCL (PF) 0.25 % IJ SOLN
INTRAMUSCULAR | Status: DC | PRN
  Administered 2011-12-09: 26 mL

## 2011-12-09 MED ORDER — METOCLOPRAMIDE HCL 5 MG/ML IJ SOLN
10.0000 mg | Freq: Once | INTRAMUSCULAR | Status: AC | PRN
  Administered 2011-12-09: 10 mg via INTRAVENOUS

## 2011-12-09 MED ORDER — SCOPOLAMINE 1 MG/3DAYS TD PT72
1.0000 | MEDICATED_PATCH | Freq: Once | TRANSDERMAL | Status: DC
  Administered 2011-12-09: 1.5 mg via TRANSDERMAL

## 2011-12-09 MED ORDER — LIDOCAINE HCL (CARDIAC) 20 MG/ML IV SOLN
INTRAVENOUS | Status: DC | PRN
  Administered 2011-12-09: 50 mg via INTRAVENOUS

## 2011-12-09 MED ORDER — PROPOFOL 10 MG/ML IV BOLUS
INTRAVENOUS | Status: DC | PRN
  Administered 2011-12-09: 250 mg via INTRAVENOUS

## 2011-12-09 SURGICAL SUPPLY — 39 items
APPLIER CLIP ROT 10 11.4 M/L (STAPLE) ×3
BLADE SURG ROTATE 9660 (MISCELLANEOUS) IMPLANT
CANISTER SUCTION 2500CC (MISCELLANEOUS) ×3 IMPLANT
CHLORAPREP W/TINT 26ML (MISCELLANEOUS) ×3 IMPLANT
CLIP APPLIE ROT 10 11.4 M/L (STAPLE) ×2 IMPLANT
CLOTH BEACON ORANGE TIMEOUT ST (SAFETY) ×3 IMPLANT
COVER MAYO STAND STRL (DRAPES) IMPLANT
DECANTER SPIKE VIAL GLASS SM (MISCELLANEOUS) ×3 IMPLANT
DERMABOND ADVANCED (GAUZE/BANDAGES/DRESSINGS) ×1
DERMABOND ADVANCED .7 DNX12 (GAUZE/BANDAGES/DRESSINGS) ×2 IMPLANT
DRAPE C-ARM 42X72 X-RAY (DRAPES) ×3 IMPLANT
DRAPE UTILITY 15X26 W/TAPE STR (DRAPE) ×6 IMPLANT
ELECT REM PT RETURN 9FT ADLT (ELECTROSURGICAL) ×3
ELECTRODE REM PT RTRN 9FT ADLT (ELECTROSURGICAL) ×2 IMPLANT
FILTER SMOKE EVAC LAPAROSHD (FILTER) IMPLANT
GLOVE BIO SURGEON STRL SZ 6.5 (GLOVE) ×6 IMPLANT
GLOVE BIO SURGEON STRL SZ7 (GLOVE) ×3 IMPLANT
GLOVE BIOGEL PI IND STRL 7.0 (GLOVE) ×6 IMPLANT
GLOVE BIOGEL PI INDICATOR 7.0 (GLOVE) ×3
GLOVE EUDERMIC 7 POWDERFREE (GLOVE) ×3 IMPLANT
GOWN PREVENTION PLUS XLARGE (GOWN DISPOSABLE) ×3 IMPLANT
GOWN PREVENTION PLUS XXLARGE (GOWN DISPOSABLE) ×3 IMPLANT
GOWN STRL NON-REIN LRG LVL3 (GOWN DISPOSABLE) ×9 IMPLANT
HEMOSTAT SNOW SURGICEL 2X4 (HEMOSTASIS) IMPLANT
NS IRRIG 1000ML POUR BTL (IV SOLUTION) ×3 IMPLANT
PACK BASIN DAY SURGERY FS (CUSTOM PROCEDURE TRAY) ×3 IMPLANT
POUCH SPECIMEN RETRIEVAL 10MM (ENDOMECHANICALS) ×3 IMPLANT
SCISSORS LAP 5X35 DISP (ENDOMECHANICALS) ×3 IMPLANT
SET CHOLANGIOGRAPH 5 50 .035 (SET/KITS/TRAYS/PACK) IMPLANT
SET IRRIG TUBING LAPAROSCOPIC (IRRIGATION / IRRIGATOR) ×3 IMPLANT
SLEEVE Z-THREAD 5X100MM (TROCAR) ×3 IMPLANT
SPECIMEN JAR SMALL (MISCELLANEOUS) IMPLANT
SUT MNCRL AB 4-0 PS2 18 (SUTURE) ×6 IMPLANT
TOWEL OR 17X24 6PK STRL BLUE (TOWEL DISPOSABLE) ×3 IMPLANT
TRAY LAPAROSCOPIC (CUSTOM PROCEDURE TRAY) ×3 IMPLANT
TROCAR XCEL BLUNT TIP 100MML (ENDOMECHANICALS) ×3 IMPLANT
TROCAR Z-THREAD FIOS 11X100 BL (TROCAR) ×3 IMPLANT
TROCAR Z-THREAD FIOS 5X100MM (TROCAR) ×6 IMPLANT
WATER STERILE IRR 1000ML POUR (IV SOLUTION) IMPLANT

## 2011-12-09 NOTE — Anesthesia Postprocedure Evaluation (Signed)
Anesthesia Post Note  Patient: Emma Robbins  Procedure(s) Performed: Procedure(s) (LRB): LAPAROSCOPIC CHOLECYSTECTOMY (N/A)  Anesthesia type: General  Patient location: PACU  Post pain: Pain level controlled  Post assessment: Patient's Cardiovascular Status Stable  Last Vitals:  Filed Vitals:   12/09/11 1245  BP: 116/71  Pulse: 94  Temp:   Resp: 13    Post vital signs: Reviewed and stable  Level of consciousness: alert  Complications: No apparent anesthesia complications

## 2011-12-09 NOTE — Telephone Encounter (Signed)
Message copied by Liliana Cline on Thu Dec 09, 2011  9:40 AM ------      Message from: Currie Paris      Created: Thu Dec 09, 2011  9:27 AM       She needs a note that she had surgery today and will miss school next week.            Family will pickup

## 2011-12-09 NOTE — H&P (View-Only) (Signed)
Emma Robbins DOB: 15-Jun-1990 MRN: [id number]                                                                                      DATE: 11/16/2011  PCP: Dara Lords, MD Referring Provider: No ref. provider found  IMPRESSION:  Symptomatic gallstones  PLAN:   Laparoscopic cholecystectomy and cholangiogram I have discussed the indications for laparoscopic cholecystectomy with her and provided educational material. We have discussed the risks of surgery, including general risks such as bleeding, infection, lung and heart issues etc. We have also discussed the potential for injuries to other organs, bile duct leaks, and other unexpected events. We have also talked about the fact that this may need to be converted to open under certain circumstances. We discussed the typical post op recovery and the fact that there is a good likelihood of improvement in symptoms and return to normal activity.  She understands this and wishes to proceed to schedule surgery. I believe all of her questions have been answered.                    CC:  Chief Complaint  Patient presents with  . Cholelithiasis    HPI:  Emma Robbins is a 21 y.o.  female who presents for evaluation of Gallstones. She has been having intermittent abdominal pain for cervical months. The pain seems to be epigastric and right upper quadrant, also with an hour or 2 of eating. Fatty foods seem to provoke it. Usually it resolves spontaneously but she did have one episode last for several hours. It occasionally wakes her at night. Is often associated with nausea. She also gets a lot of abdominal bloating. Workup included a gallbladder ultrasound shows a single gallstone without evidence of acute cholecystitis. Surgical evaluation was requested.  PMH:  has a past medical history of Abdominal pain; Ovarian cyst; Fatigue; Hemorrhoids; and Anxiety.  PSH:   has past surgical history that includes right  breast lumpectomy (2012); Laparoscopic ovarian cystectomy (9.19.12); and Breast surgery.  ALLERGIES:   Allergies  Allergen Reactions  . Other Nausea And Vomiting    Sweet tea    MEDICATIONS: No current outpatient prescriptions on file.  ROS: She has filled out our 12 point review of systems and it is negative except as HPI, plus problems with headaches. She feels "weak" when she has these episodes. EXAM:   VITAL SIGNS: BP 110/80  Pulse 82  Temp 98.2 F (36.8 C) (Temporal)  Ht 5\' 4"  (1.626 m)  Wt 179 lb 3.2 oz (81.285 kg)  BMI 30.76 kg/m2  SpO2 98%  LMP 09/16/2011 GENERAL:  The patient is alert, oriented, and generally healthy-appearing, NAD. Mood and affect are normal.  HEENT:  The head is normocephalic, the eyes nonicteric, the pupils were round regular and equal. EOMs are normal. Pharynx normal. Dentition good.  NECK:  The neck is supple and there are no masses or thyromegaly.  LUNGS: Normal respirations and clear to auscultation.  HEART: Regular rhythm, with no murmurs rubs or gallops. Pulses are intact carotid dorsalis pedis and posterior tibial. No significant varicosities are noted.  ABDOMEN: Soft, flat, and nontender. No  masses or organomegaly is noted. No hernias are noted. Bowel sounds are normal.  EXTREMITIES:  Good range of motion, no edema.   DATA REVIEWED:  I have reviewed medical records in electronic record including x-rays and GI notes.    Deverick Pruss J 11/16/2011  CC: No ref. provider found, Dara Lords, MD

## 2011-12-09 NOTE — Anesthesia Preprocedure Evaluation (Signed)
Anesthesia Evaluation  Patient identified by MRN, date of birth, ID band Patient awake    Reviewed: Allergy & Precautions, H&P , NPO status , Patient's Chart, lab work & pertinent test results, reviewed documented beta blocker date and time   Airway Mallampati: II TM Distance: >3 FB Neck ROM: full    Dental   Pulmonary neg pulmonary ROS,  breath sounds clear to auscultation        Cardiovascular negative cardio ROS  Rhythm:regular     Neuro/Psych  Headaches, negative psych ROS   GI/Hepatic negative GI ROS, Neg liver ROS,   Endo/Other  negative endocrine ROS  Renal/GU negative Renal ROS  negative genitourinary   Musculoskeletal   Abdominal   Peds  Hematology negative hematology ROS (+)   Anesthesia Other Findings See surgeon's H&P   Reproductive/Obstetrics negative OB ROS                           Anesthesia Physical Anesthesia Plan  ASA: I  Anesthesia Plan: General   Post-op Pain Management:    Induction: Intravenous  Airway Management Planned: Oral ETT  Additional Equipment:   Intra-op Plan:   Post-operative Plan: Extubation in OR  Informed Consent: I have reviewed the patients History and Physical, chart, labs and discussed the procedure including the risks, benefits and alternatives for the proposed anesthesia with the patient or authorized representative who has indicated his/her understanding and acceptance.   Dental Advisory Given  Plan Discussed with: CRNA and Surgeon  Anesthesia Plan Comments:         Anesthesia Quick Evaluation  

## 2011-12-09 NOTE — Transfer of Care (Signed)
Immediate Anesthesia Transfer of Care Note  Patient: Emma Robbins  Procedure(s) Performed: Procedure(s) (LRB) with comments: LAPAROSCOPIC CHOLECYSTECTOMY (N/A)  Patient Location: PACU  Anesthesia Type: General  Level of Consciousness: awake and alert   Airway & Oxygen Therapy: Patient Spontanous Breathing and Patient connected to face mask oxygen  Post-op Assessment: Report given to PACU RN and Post -op Vital signs reviewed and stable  Post vital signs: Reviewed and stable  Complications: No apparent anesthesia complications

## 2011-12-09 NOTE — Op Note (Signed)
Emma Robbins 11-28-1990 [id number] 11/22/2011  Preoperative diagnosis: chronic calculus cholecystitis  Postoperative diagnosis: the same  Procedure: laparoscopic cholecystectomy  Surgeon: Currie Paris, MD, FACS  Assistant surgeon: Dr. Romie Levee   Anesthesia: General  Clinical History and Indications: This patient has known gallstones and comes in today for cholecystectomy.  Description of procedure: The patient was seen in the preoperative area. I reviewed the plans for the procedure with her as well as the risks and complications. She had no further questions.  The patient was taken to the operating room. After satisfactory general endotracheal anesthesia had been obtained the abdomen was prepped and draped. A time out was done.  0.25% plain Marcaine was used at all incisions. I made an umbilical incision, identified the fascia and opened that, and entered the peritoneal cavity under direct vision. A 0 Vicryl pursestring suture was placed and the Hasson cannula was introduced under direct vision and secured with the pursestring. The abdomen was inflated to 15 cm.  The camera was placed and there were no gross abnormalities. The patient was then placed in reverse Trendelenburg and tilted to the left. A 10/11 trocar was placed in the epigastrium and two 5 mm trochars placed laterally all under direct vision.  The gallbladder was identified and had a few lesions which were gently taken down. Perineum overlying the cystic duct was open. There is a small vessel either a lymphatic or tiny artery on the anterior inferior aspect of the cystic duct which was divided with a clip. There is ill to isolate a long segment of the cystic duct and she clearly is junction with the gallbladder and common duct was a nice window behind it. The artery was visualized behind it as well. Clip was placed on the cystic duct.  An intraoperative cholangiogram was then attempted. A Cook  catheter was introduced percutaneously and we attempted to place itin the cystic duct. However we are never able to get threaded successfully. Since he had clear anatomy and there was no indication for cholangiography the attempt was abandoned. The catheter was removed and 3 clips placed on the stay side of the cystic duct. The duct was then divided.  Additional clips are placed on the cystic artery and it was divided. The gallbladder was then removed from below to above the coagulation current of the cautery. It was then placed in a bag to be retrieved later.  The abdomen was irrigated and a check for hemostasis along the bed of the gallbladder made. Once everything appeared to be dry we were able to move the camera to the epigastric port and removed the gallbladder through the umbilical port.  The abdomen was reinsufflated and a final check for hemostasis made. There is no evidence of bleeding or bile leakage. The lateral ports were removed under direct vision and there was no bleeding. The umbilical site was closed with a pursestring, watching with the camera in the epigastric port. The abdomen was then deflated through the epigastric port and that was removed. Skin was closed with 4-0 Monocryl subcuticular and Dermabond.  The patient tolerated the procedure well. There were no operative complications. EBL was minimal. All counts were correct.  Currie Paris, MD, FACS 12/09/2011 10:38 AM

## 2011-12-09 NOTE — Telephone Encounter (Signed)
Note at front for patient pick up.

## 2011-12-09 NOTE — Anesthesia Procedure Notes (Signed)
Procedure Name: Intubation Performed by: Chanoch Mccleery W Pre-anesthesia Checklist: Patient identified, Timeout performed, Emergency Drugs available, Suction available and Patient being monitored Patient Re-evaluated:Patient Re-evaluated prior to inductionOxygen Delivery Method: Circle system utilized Preoxygenation: Pre-oxygenation with 100% oxygen Intubation Type: IV induction Ventilation: Mask ventilation without difficulty Laryngoscope Size: Miller and 2 Grade View: Grade I Tube type: Oral Number of attempts: 1 Airway Equipment and Method: Stylet Placement Confirmation: ETT inserted through vocal cords under direct vision,  breath sounds checked- equal and bilateral and positive ETCO2 Secured at: 21 cm Tube secured with: Tape Dental Injury: Teeth and Oropharynx as per pre-operative assessment      

## 2011-12-09 NOTE — Anesthesia Postprocedure Evaluation (Deleted)
Anesthesia Post Note  Patient: Designer, television/film set  Procedure(s) Performed: Procedure(s) (LRB): LAPAROSCOPIC CHOLECYSTECTOMY (N/A)  Anesthesia type: MAC  Patient location: PACU  Post pain: Pain level controlled  Post assessment: Patient's Cardiovascular Status Stable  Last Vitals:  Filed Vitals:   12/09/11 1245  BP: 116/71  Pulse: 94  Temp:   Resp: 13    Post vital signs: Reviewed and stable  Level of consciousness: alert  Complications: No apparent anesthesia complications

## 2011-12-09 NOTE — Interval H&P Note (Signed)
History and Physical Interval Note:  12/09/2011 9:25 AM  Emma Robbins  has presented today for surgery, with the diagnosis of gallstone  The various methods of treatment have been discussed with the patient and family. After consideration of risks, benefits and other options for treatment, the patient has consented to  Procedure(s) (LRB) with comments: LAPAROSCOPIC CHOLECYSTECTOMY WITH INTRAOPERATIVE CHOLANGIOGRAM (N/A) - laparoscopic cholecystectomy with Intraoperative cholangiogram as a surgical intervention .  The patient's history has been reviewed, patient examined, no change in status, stable for surgery.  I have reviewed the patient's chart and labs.  Questions were answered to the patient's satisfaction.     Dawn Convery J

## 2011-12-10 ENCOUNTER — Encounter (HOSPITAL_BASED_OUTPATIENT_CLINIC_OR_DEPARTMENT_OTHER): Payer: Self-pay | Admitting: Surgery

## 2011-12-13 ENCOUNTER — Encounter (INDEPENDENT_AMBULATORY_CARE_PROVIDER_SITE_OTHER): Payer: Self-pay | Admitting: General Surgery

## 2011-12-13 ENCOUNTER — Ambulatory Visit (INDEPENDENT_AMBULATORY_CARE_PROVIDER_SITE_OTHER): Payer: Self-pay | Admitting: General Surgery

## 2011-12-13 VITALS — BP 110/68 | HR 92 | Temp 97.4°F | Resp 16 | Ht 64.0 in | Wt 173.4 lb

## 2011-12-13 DIAGNOSIS — K8020 Calculus of gallbladder without cholecystitis without obstruction: Secondary | ICD-10-CM

## 2011-12-13 NOTE — Patient Instructions (Signed)
The incision at your umbilicus is healing without any signs of infection. Keep a dry gauze bandage on it until the drainage stops. You do not need antibiotics.  You may return to school this coming Monday. No heavy lifting or strenuous activities for 2 weeks.  You will be given an appointment to see Dr. Jamey Ripa in approximately 2 weeks.

## 2011-12-13 NOTE — Progress Notes (Signed)
Patient ID: Emma Robbins, female   DOB: 22-Feb-1991, 20 y.o.   MRN: [id number] History: This patient underwent laparoscopic cholecystectomy or gallstones by Dr. Jamey Ripa  on September 12. She is doing well;  says she's had some drainage from her umbilicus and wants to be checked. Otherwise no complaints and good progress to date.  Exam: Patient looks well. In no distress. Mother is with her. Umbilical incision shows a little bit of serous drainage but no erythema no abscess no purulence no hernia. I removed the Dermabond and checked the wound and it looked fine. Dry gauze bandage placed with antibiotic ointment  Assessment wound drainage postop laparoscopic cholecystectomy. This appears to be noninfected serous fluid which should be self-limited.  Plan: Patient and mother reassured. Keep dry gauze bandage on wound internal drainage stops. Return to school on Monday Upon with Dr. Jamey Ripa in 2 weeks for postop check.   Angelia Mould. Derrell Lolling, M.D., [facility] General and Minimally invasive Surgery Breast and Colorectal Surgery Office:   [phone] Pager:   [phone]

## 2011-12-30 ENCOUNTER — Ambulatory Visit (INDEPENDENT_AMBULATORY_CARE_PROVIDER_SITE_OTHER): Payer: Self-pay | Admitting: Surgery

## 2011-12-30 ENCOUNTER — Encounter (INDEPENDENT_AMBULATORY_CARE_PROVIDER_SITE_OTHER): Payer: Self-pay | Admitting: Surgery

## 2011-12-30 VITALS — BP 114/60 | HR 72 | Temp 97.2°F | Resp 16 | Ht 64.0 in | Wt 176.2 lb

## 2011-12-30 DIAGNOSIS — Z09 Encounter for follow-up examination after completed treatment for conditions other than malignant neoplasm: Secondary | ICD-10-CM

## 2011-12-30 NOTE — Progress Notes (Signed)
/  NAME: Emma Robbins       DOB: [birth year]         /  DATE: 12/30/2011       ZOX:[id number]   CC: Postop laparoscopic cholecystectomy. ? Breast mass right  Impression:  The patient appears to be doing well, with improvement in her symptoms. Fibrocystic breast changes  Plan:  She may resume full activity and regular diet. She  will followup with [place] on a p.r.n. basis. I did tell her that she may still have some foods that cause use indigestion and ask her to call us if there are any questions, problems or concerns.  HPI:  This patient underwent a laparoscopic cholecystectomy without operative cholangiogram on 12/09/11. She is in for her second postoperative visit. She notes that her incisional pain has resolved. Her preoperative symptoms have improved. She is not having problems with nausea, vomiting, diarrhea, fevers, chills, or urinary symptoms. She is tolerating diet. She feels that she is progressing well and nearly back to normal.  She notes a tender area in the lateral aspect of the right breast that she wanted me to check out. She had prior surgery to remove something there - I suspect a fibroadenoma PE:  VS: BP 114/60  Pulse 72  Temp 97.2 F (36.2 C) (Temporal)  Resp 16  Ht 5\' 4"  (1.626 m)  Wt 176 lb 3.2 oz (79.924 kg)  BMI 30.24 kg/m2  LMP 11/20/2011  General: The patient is alert and appears comfortable, NAD.  Abdomen: Soft and benign. The incisions are healing nicely. There are no apparent problems.  Breasts: Symmetric -diffusely irregular c/w f/c changes. No dominant mass  Data reviewed:  Pathology: Diagnosis Gallbladder - CHRONIC CHOLECYSTITIS AND CHOLELITHIASIS. - THERE IS NO EVIDENCE OF MALIGNANCY. JOSHUA KISH

## 2012-01-12 NOTE — Telephone Encounter (Signed)
See previous message

## 2012-02-07 ENCOUNTER — Encounter: Payer: Self-pay | Admitting: Gynecology

## 2012-02-07 ENCOUNTER — Ambulatory Visit (INDEPENDENT_AMBULATORY_CARE_PROVIDER_SITE_OTHER): Payer: Self-pay | Admitting: Gynecology

## 2012-02-07 DIAGNOSIS — N926 Irregular menstruation, unspecified: Secondary | ICD-10-CM

## 2012-02-07 NOTE — Patient Instructions (Signed)
Office will call you with lab results 

## 2012-02-07 NOTE — Progress Notes (Signed)
Patient presents with history of regular monthly menses with LMP 20 September. She skipped Octobers menses and then started bleeding over the last day or 2 with passage of heavy clots now tapering off. Was cramping but now this has also tapered off.  Is currently sexually active and not using anything for contraception.  Exam with [facility] assistant Abdomen soft nontender without masses guarding rebound or organomegaly Pelvic external BUS vagina with menses flow. Cervix normal and closed. Uterus normal size midline mobile nontender. Adnexa without masses or tenderness.  Assessment and plan: Irregular menses with heavy bleeding now resolving. Rule out pregnancy event with quantitative hCG. We'll also check ABO status and baseline CBC. If hCG negative them plan expectant management and assuming resumes regular menses we'll follow. If hCG positive then plan follow up ultrasound assess for RhoGAM. Discussed birth control with her and the options to include pill patch ring Implanon IUD. She did not like taking the pill because she forgot and I discussed various apps for her cell phone that may help her to remember.  She is due for her annual have recommended once his bleeding is over that she can go ahead and schedule an annual exam and we'll further discuss birth control options.

## 2012-02-08 LAB — CBC WITH DIFFERENTIAL/PLATELET
Basophils Relative: 0 % (ref 0–1)
Eosinophils Absolute: 0.1 10*3/uL (ref 0.0–0.7)
Eosinophils Relative: 2 % (ref 0–5)
Hemoglobin: 13 g/dL (ref 12.0–15.0)
Lymphs Abs: 2 10*3/uL (ref 0.7–4.0)
MCH: 31.9 pg (ref 26.0–34.0)
MCHC: 34.5 g/dL (ref 30.0–36.0)
MCV: 92.6 fL (ref 78.0–100.0)
Monocytes Relative: 8 % (ref 3–12)
Neutrophils Relative %: 53 % (ref 43–77)
Platelets: 283 10*3/uL (ref 150–400)

## 2012-02-08 LAB — HCG, QUANTITATIVE, PREGNANCY: hCG, Beta Chain, Quant, S: <2 m[IU]/mL

## 2012-06-26 ENCOUNTER — Encounter: Payer: Self-pay | Admitting: Gynecology

## 2012-06-26 ENCOUNTER — Ambulatory Visit (INDEPENDENT_AMBULATORY_CARE_PROVIDER_SITE_OTHER): Payer: Self-pay | Admitting: Gynecology

## 2012-06-26 ENCOUNTER — Other Ambulatory Visit: Payer: Self-pay | Admitting: Gynecology

## 2012-06-26 ENCOUNTER — Other Ambulatory Visit (HOSPITAL_COMMUNITY)
Admission: RE | Admit: 2012-06-26 | Discharge: 2012-06-26 | Disposition: A | Discharge status: Home / Self Care | Payer: Self-pay | Source: Ambulatory Visit | Attending: Gynecology | Admitting: Gynecology

## 2012-06-26 VITALS — BP 114/66 | Ht 63.0 in | Wt 170.0 lb

## 2012-06-26 DIAGNOSIS — Z01419 Encounter for gynecological examination (general) (routine) without abnormal findings: Secondary | ICD-10-CM

## 2012-06-26 DIAGNOSIS — N926 Irregular menstruation, unspecified: Secondary | ICD-10-CM

## 2012-06-26 DIAGNOSIS — Z113 Encounter for screening for infections with a predominantly sexual mode of transmission: Secondary | ICD-10-CM

## 2012-06-26 DIAGNOSIS — N949 Unspecified condition associated with female genital organs and menstrual cycle: Secondary | ICD-10-CM

## 2012-06-26 DIAGNOSIS — K649 Unspecified hemorrhoids: Secondary | ICD-10-CM

## 2012-06-26 DIAGNOSIS — R102 Pelvic and perineal pain: Secondary | ICD-10-CM

## 2012-06-26 DIAGNOSIS — R300 Dysuria: Secondary | ICD-10-CM

## 2012-06-26 LAB — URINALYSIS W MICROSCOPIC + REFLEX CULTURE
Bilirubin Urine: NEGATIVE
Casts: NONE SEEN
Crystals: NONE SEEN
Glucose, UA: NEGATIVE mg/dL
Protein, ur: NEGATIVE mg/dL
pH: 5.5 (ref 5.0–8.0)

## 2012-06-26 LAB — TSH: TSH: 1.775 u[IU]/mL (ref 0.350–4.500)

## 2012-06-26 LAB — PROLACTIN: Prolactin: 10 ng/mL

## 2012-06-26 LAB — HEPATITIS C ANTIBODY: HCV Ab: NEGATIVE

## 2012-06-26 LAB — SYPHILIS: RPR W/REFLEX TO RPR TITER AND TREPONEMAL ANTIBODIES, TRADITIONAL SCREENING AND DIAGNOSIS ALGORITHM

## 2012-06-26 LAB — FOLLICLE STIMULATING HORMONE: FSH: 7.3 m[IU]/mL

## 2012-06-26 LAB — HIV ANTIBODY (ROUTINE TESTING W REFLEX): HIV: NONREACTIVE

## 2012-06-26 MED ORDER — NORETHINDRONE ACET-ETHINYL EST 1-20 MG-MCG PO TABS: 1.0000 | ORAL_TABLET | Freq: Every day | ORAL | Status: DC

## 2012-06-26 MED ORDER — SULFAMETHOXAZOLE-TRIMETHOPRIM 800-160 MG PO TABS: 1.0000 | ORAL_TABLET | Freq: Two times a day (BID) | ORAL | Status: DC

## 2012-06-26 NOTE — Progress Notes (Signed)
Emma Robbins 1990/04/26 [id number]        22 y.o.  G0P0 presents with several issues. She is due for her annual exam and has a scheduled next month and we went ahead and converted her to an annual exam today. Several issues were reviewed per below.  Past medical history,surgical history, medications, allergies, family history and social history were all reviewed and documented in the EPIC chart. ROS:  Was performed and pertinent positives and negatives are included in the history.  Exam: Kim assistant Filed Vitals:   06/26/12 1310  BP: 114/66  Height: 5\' 3"  (1.6 m)  Weight: 170 lb (77.111 kg)   General appearance  Normal Skin grossly normal Head/Neck normal with no cervical or supraclavicular adenopathy thyroid normal Lungs  clear Cardiac RR, without RMG Abdominal  soft, nontender, without masses, organomegaly or hernia Breasts  examined lying and sitting without masses, retractions, discharge or axillary adenopathy. Pelvic  Ext/BUS/vagina  normal   Cervix  normal GC/chlamydia, Pap done  Uterus  anteverted, normal size, shape and contour, midline and mobile nontender   Adnexa  Without masses or tenderness    Anus and perineum  normal   Rectovaginal  normal sphincter tone without palpated masses or tenderness.    Assessment/Plan:  22 y.o. G0P0 female.   1. Dysuria. Several day history of worsening dysuria. No fever chills nausea vomiting diarrhea constipation or low back pain. Urinalysis consistent with UTI. We'll treat with Septra DS 1 by mouth twice a day x3 days. 2. Right breast tenderness. Comes and goes. Has been a chronic issue over the past year or so since her benign right breast biopsy. Exam is normal without galactorrhea or masses. Is not constant but fluctuates throughout the month. Recommend observation at this point and she will represent if she palpates any abnormalities. 3. Irregular menses. Patient is having some skips in her menses up to one to 2  months. No intermenstrual bleeding. We'll check baseline FSH TSH and prolactin. We'll start low-dose oral contraceptives at her choice for contraception as well as menstrual regulation. Loestrin 120 equivalents prescribed Sunday start following next menses backup contraception first pack all reviewed. 4. Pelvic bloating. Has been coming and going of the past year or so. Prior ultrasound was normal and nothing new has changed from a symptomatic standpoint. Exam is normal today. No overlying GI symptoms such as diarrhea constipation. Recommend starting on the oral contraceptives to regulate her menses. If her pelvic discomfort continues following that she'll represent for further evaluation. 5. STD screening. Patient requests STD screening. No known exposure but must be screened. GC/Chlamydia, HIV, RPR, hepatitis B, hepatitis C done. 6. Hemorrhoid. Patient does note occasionally with bowel movements protrusion of a small hemorrhoid. Her exam today is normal. Recommend observation at present. If it continues consistently she'll represent a possible referral to general surgery for banding. 7. Health maintenance. No other blood work done above done as she's recently had CBC comprehensive metabolic panel this past year. Followup one year, sooner if her pelvic discomfort, irregular menses or breast tenderness continues.Dara Lords MD, 1:55 PM 06/26/2012

## 2012-06-26 NOTE — Patient Instructions (Signed)
Start on birth control pills as we discussed. Followup if your pain or irregular bleeding continues.

## 2012-07-10 ENCOUNTER — Encounter: Payer: Self-pay | Admitting: Gynecology

## 2012-11-17 ENCOUNTER — Encounter (HOSPITAL_COMMUNITY): Payer: Self-pay | Admitting: *Deleted

## 2012-11-17 ENCOUNTER — Other Ambulatory Visit: Payer: Self-pay

## 2012-11-17 ENCOUNTER — Emergency Department (HOSPITAL_COMMUNITY)
Admission: EM | Admit: 2012-11-17 | Discharge: 2012-11-17 | Disposition: A | Discharge status: Home / Self Care | Payer: Self-pay | Attending: Emergency Medicine | Admitting: Emergency Medicine

## 2012-11-17 DIAGNOSIS — Z8719 Personal history of other diseases of the digestive system: Secondary | ICD-10-CM | POA: Insufficient documentation

## 2012-11-17 DIAGNOSIS — R1013 Epigastric pain: Secondary | ICD-10-CM | POA: Insufficient documentation

## 2012-11-17 DIAGNOSIS — R51 Headache: Secondary | ICD-10-CM | POA: Insufficient documentation

## 2012-11-17 DIAGNOSIS — N390 Urinary tract infection, site not specified: Secondary | ICD-10-CM | POA: Insufficient documentation

## 2012-11-17 DIAGNOSIS — Z8679 Personal history of other diseases of the circulatory system: Secondary | ICD-10-CM | POA: Insufficient documentation

## 2012-11-17 DIAGNOSIS — Z9889 Other specified postprocedural states: Secondary | ICD-10-CM | POA: Insufficient documentation

## 2012-11-17 DIAGNOSIS — R209 Unspecified disturbances of skin sensation: Secondary | ICD-10-CM | POA: Insufficient documentation

## 2012-11-17 DIAGNOSIS — R350 Frequency of micturition: Secondary | ICD-10-CM | POA: Insufficient documentation

## 2012-11-17 DIAGNOSIS — N926 Irregular menstruation, unspecified: Secondary | ICD-10-CM | POA: Insufficient documentation

## 2012-11-17 DIAGNOSIS — Z3202 Encounter for pregnancy test, result negative: Secondary | ICD-10-CM | POA: Insufficient documentation

## 2012-11-17 DIAGNOSIS — Z9089 Acquired absence of other organs: Secondary | ICD-10-CM | POA: Insufficient documentation

## 2012-11-17 LAB — POCT I-STAT, CHEM 8
BUN: 7 mg/dL (ref 6–23)
Calcium, Ion: 1.22 mmol/L (ref 1.12–1.23)
Chloride: 103 mEq/L (ref 96–112)
HCT: 38 % (ref 36.0–46.0)
Potassium: 3.4 mEq/L — ABNORMAL LOW (ref 3.5–5.1)
Sodium: 140 mEq/L (ref 135–145)

## 2012-11-17 LAB — URINALYSIS, ROUTINE W REFLEX MICROSCOPIC
Glucose, UA: NEGATIVE mg/dL
Hgb urine dipstick: NEGATIVE
Protein, ur: NEGATIVE mg/dL
Specific Gravity, Urine: 1.031 — ABNORMAL HIGH (ref 1.005–1.030)
pH: 5.5 (ref 5.0–8.0)

## 2012-11-17 LAB — URINE MICROSCOPIC-ADD ON

## 2012-11-17 LAB — POCT PREGNANCY, URINE: Preg Test, Ur: NEGATIVE

## 2012-11-17 MED ORDER — SODIUM CHLORIDE 0.9 % IV SOLN: INTRAVENOUS | Status: DC

## 2012-11-17 MED ORDER — SULFAMETHOXAZOLE-TRIMETHOPRIM 800-160 MG PO TABS: 1.0000 | ORAL_TABLET | Freq: Two times a day (BID) | ORAL | Status: DC

## 2012-11-17 MED ORDER — SODIUM CHLORIDE 0.9 % IV BOLUS (SEPSIS)
1000.0000 mL | Freq: Once | INTRAVENOUS | Status: AC
  Administered 2012-11-17: 1000 mL via INTRAVENOUS

## 2012-11-17 NOTE — ED Notes (Addendum)
Pt was dizzy and light-headed; while taking the orthostatic vitals (standing). The RN is aware.

## 2012-11-17 NOTE — ED Provider Notes (Signed)
CSN: [id number]     Arrival date & time 11/17/12  1825 History     First MD Initiated Contact with Patient 11/17/12 1918     Chief Complaint  Patient presents with  . Dizziness   (Consider location/radiation/quality/duration/timing/severity/associated sxs/prior Treatment) HPI Comments: Patient reports she had several symptoms today that bring her to the ER.  She started a new job today, had three episodes of lightheadedness and dizziness that lasted 5 minutes each, resolved with sitting down.  At 4pm she began having tingling across her chin on both sides and an hour later developed tingling in her bilateral hands, these both resolved around 6pm.  Also began having a headache over the top of her head an hour ago.    Pt also notes she has had daily nausea and vomiting with associated sweating and burning epigastric pain for the past month, last episode was two days ago.  Developed small amount of epigastric burning during our interview.    Pt also notes 1 month of burning around her bilateral breast and nipples.  Also is having irregular periods.  States she has seen her PCP for this who has prescribed birth control pills but she has not started taking them.    Also having urinary frequency.    The history is provided by the patient.    Past Medical History  Diagnosis Date  . Hemorrhoids   . Headache(784.0)     unspecified - 2-3 x/week  . Gallstone 11/2011  . Dental crowns present    Past Surgical History  Procedure Laterality Date  . Laparoscopic ovarian cystectomy  12/16/10    Right cystadenofibroma  . Cholecystectomy  12/09/2011    Procedure: LAPAROSCOPIC CHOLECYSTECTOMY;  Surgeon: Currie Paris, MD;  Location: [facility];  Service: General;  Laterality: N/A;  . Breast surgery      Breast lump excised   Family History  Problem Relation Age of Onset  . Hypertension Maternal Grandmother   . Hyperlipidemia Father   . Thyroid disease Father   . Thyroid  disease Brother   . Diabetes Paternal Grandmother    History  Substance Use Topics  . Smoking status: Never Smoker   . Smokeless tobacco: Never Used  . Alcohol Use: Yes     Comment: Rare   OB History   Grav Para Term Preterm Abortions TAB SAB Ect Mult Living   0              Review of Systems  Constitutional: Negative for fever.  Respiratory: Negative for cough and shortness of breath.   Cardiovascular: Negative for chest pain.  Gastrointestinal: Negative for diarrhea and constipation.  Genitourinary: Positive for frequency and menstrual problem. Negative for dysuria, urgency, vaginal bleeding and vaginal discharge.  Neurological: Positive for headaches. Negative for weakness.    Allergies  Other  Home Medications  No current outpatient prescriptions on file. BP 106/66  Pulse 63  Temp(Src) 98.5 F (36.9 C) (Oral)  Resp 16  SpO2 97% Physical Exam  Nursing note and vitals reviewed. Constitutional: She appears well-developed and well-nourished. No distress.  HENT:  Head: Normocephalic and atraumatic.  Neck: Neck supple.  Cardiovascular: Normal rate and regular rhythm.   Pulmonary/Chest: Effort normal and breath sounds normal. No respiratory distress. She has no wheezes. She has no rales.  Abdominal: Soft. She exhibits no distension and no mass. There is no tenderness. There is no rebound and no guarding.  Neurological: She is alert. She has normal strength.  No cranial nerve deficit or sensory deficit. She exhibits normal muscle tone. Gait normal. GCS eye subscore is 4. GCS verbal subscore is 5. GCS motor subscore is 6.  CN II-XII intact, EOMs intact, no pronator drift, grip strengths equal bilaterally; strength 5/5 in all extremities, sensation intact in all extremities; finger to nose, heel to shin, rapid alternating movements normal; gait is normal.     Skin: She is not diaphoretic.    ED Course   Procedures (including critical care time)  Labs Reviewed   URINALYSIS, ROUTINE W REFLEX MICROSCOPIC - Abnormal; Notable for the following:    APPearance HAZY (*)    Specific Gravity, Urine 1.031 (*)    Bilirubin Urine SMALL (*)    Ketones, ur >80 (*)    Leukocytes, UA MODERATE (*)    All other components within normal limits  URINE MICROSCOPIC-ADD ON - Abnormal; Notable for the following:    Squamous Epithelial / LPF MANY (*)    Bacteria, UA MANY (*)    All other components within normal limits  POCT I-STAT, CHEM 8 - Abnormal; Notable for the following:    Potassium 3.4 (*)    Glucose, Bld 134 (*)    All other components within normal limits  URINE CULTURE  POCT PREGNANCY, URINE   No results found. 1. UTI (lower urinary tract infection)     MDM  Pt with multiple symptoms, mostly chronic.  Abdominal pain, N/V have been ongoing but not over the past two days.  Abdominal exam is unremarkable.  Only acute concern today is lightheadedness/dizziness - patient states she is not eating very much.  Not orthostatic.  Not pregnant. Neurologically intact. She does have a UTI. I have advised her to eat more substantial meals and drink plenty of fluids.  Discussed all results with patient.  Pt given return precautions.  Pt verbalizes understanding and agrees with plan.     [place], PA-C 11/18/12 [phone]

## 2012-11-17 NOTE — ED Notes (Signed)
Pt reports dizziness, vomiting, numbness to lower face, breast burning, and epigastric pain since yesterday.  Pt denies pain at this time.  Ambulatory.

## 2012-11-18 NOTE — ED Provider Notes (Signed)
Medical screening examination/treatment/procedure(s) were performed by non-physician practitioner and as supervising physician I was immediately available for consultation/collaboration.  Jinan Biggins T Adeleigh Barletta, MD 11/18/12 1210 

## 2012-11-19 LAB — URINE CULTURE

## 2013-01-04 ENCOUNTER — Encounter: Payer: Self-pay | Admitting: Gynecology

## 2013-01-04 ENCOUNTER — Ambulatory Visit (INDEPENDENT_AMBULATORY_CARE_PROVIDER_SITE_OTHER): Payer: Self-pay | Admitting: Gynecology

## 2013-01-04 DIAGNOSIS — N644 Mastodynia: Secondary | ICD-10-CM

## 2013-01-04 DIAGNOSIS — R300 Dysuria: Secondary | ICD-10-CM

## 2013-01-04 MED ORDER — NORETHINDRONE ACET-ETHINYL EST 1-20 MG-MCG PO TABS: 1.0000 | ORAL_TABLET | Freq: Every day | ORAL | Status: DC

## 2013-01-04 NOTE — Progress Notes (Signed)
Patient presents with 2 issues: 1. Bilateral breast tenderness comes and goes with burning lateral portions of both breasts. Has been going on for months. No nipple discharge or palpable masses. History of fibroadenoma excision right breast 2012. 2. Recurrent UTIs. Now with some mild dysuria and darker urine. No back pain fever chills frequency or abdominal pain.  Exam with Berenice Bouton Both breast examined lying and sitting. No masses retractions discharge adenopathy. Abdomen soft nontender without masses guarding rebound organomegaly.  Assessment and plan: 1. Bilateral mastalgia with normal exam.  Discussed various options. Recommended that she start on a low dose oral contraceptives and see if this doesn't help and she agrees with this. Loestrin 120 equivalent prescribed refill x6. Followup in March for her annual exam. Reviewed how to start the pills. 2. History of recurrent UTIs. Having some dysuria now. Exam is normal. We'll check urinalysis. If negative and symptoms persist may refer to urology rule out interstitial cystitis.

## 2013-01-04 NOTE — Patient Instructions (Addendum)
Start on the birth control pills as we discussed. Followup if your breast tenderness continues.  Otherwise followup in March when you're due for your annual exam.

## 2013-01-05 LAB — URINALYSIS W MICROSCOPIC + REFLEX CULTURE
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
## 2013-12-28 ENCOUNTER — Emergency Department (HOSPITAL_COMMUNITY)
Admission: EM | Admit: 2013-12-28 | Discharge: 2013-12-29 | Disposition: A | Discharge status: Home / Self Care | Payer: Self-pay | Attending: Emergency Medicine | Admitting: Emergency Medicine

## 2013-12-28 ENCOUNTER — Encounter (HOSPITAL_COMMUNITY): Payer: Self-pay | Admitting: Emergency Medicine

## 2013-12-28 DIAGNOSIS — G8929 Other chronic pain: Secondary | ICD-10-CM | POA: Insufficient documentation

## 2013-12-28 DIAGNOSIS — Z8719 Personal history of other diseases of the digestive system: Secondary | ICD-10-CM | POA: Insufficient documentation

## 2013-12-28 DIAGNOSIS — Z9889 Other specified postprocedural states: Secondary | ICD-10-CM | POA: Insufficient documentation

## 2013-12-28 DIAGNOSIS — R1032 Left lower quadrant pain: Secondary | ICD-10-CM | POA: Insufficient documentation

## 2013-12-28 DIAGNOSIS — Z9049 Acquired absence of other specified parts of digestive tract: Secondary | ICD-10-CM | POA: Insufficient documentation

## 2013-12-28 DIAGNOSIS — Z8679 Personal history of other diseases of the circulatory system: Secondary | ICD-10-CM | POA: Insufficient documentation

## 2013-12-28 DIAGNOSIS — Z79899 Other long term (current) drug therapy: Secondary | ICD-10-CM | POA: Insufficient documentation

## 2013-12-28 DIAGNOSIS — Z3202 Encounter for pregnancy test, result negative: Secondary | ICD-10-CM | POA: Insufficient documentation

## 2013-12-28 LAB — URINALYSIS, ROUTINE W REFLEX MICROSCOPIC
BILIRUBIN URINE: NEGATIVE
Glucose, UA: NEGATIVE mg/dL
HGB URINE DIPSTICK: NEGATIVE
Ketones, ur: NEGATIVE mg/dL
NITRITE: NEGATIVE
PH: 6.5 (ref 5.0–8.0)
Protein, ur: NEGATIVE mg/dL
SPECIFIC GRAVITY, URINE: 1.01 (ref 1.005–1.030)
UROBILINOGEN UA: 0.2 mg/dL (ref 0.0–1.0)

## 2013-12-28 LAB — PREGNANCY, URINE: PREG TEST UR: NEGATIVE

## 2013-12-28 LAB — CBC WITH DIFFERENTIAL/PLATELET
BASOS ABS: 0 10*3/uL (ref 0.0–0.1)
BASOS PCT: 0 % (ref 0–1)
EOS ABS: 0.1 10*3/uL (ref 0.0–0.7)
Eosinophils Relative: 1 % (ref 0–5)
HCT: 40.3 % (ref 36.0–46.0)
HEMOGLOBIN: 14 g/dL (ref 12.0–15.0)
Lymphocytes Relative: 25 % (ref 12–46)
Lymphs Abs: 2.4 10*3/uL (ref 0.7–4.0)
MCH: 32.3 pg (ref 26.0–34.0)
MCHC: 34.7 g/dL (ref 30.0–36.0)
MCV: 92.9 fL (ref 78.0–100.0)
Monocytes Absolute: 0.7 10*3/uL (ref 0.1–1.0)
Monocytes Relative: 7 % (ref 3–12)
NEUTROS ABS: 6.3 10*3/uL (ref 1.7–7.7)
NEUTROS PCT: 67 % (ref 43–77)
PLATELETS: 291 10*3/uL (ref 150–400)
RBC: 4.34 MIL/uL (ref 3.87–5.11)
RDW: 12.4 % (ref 11.5–15.5)
WBC: 9.4 10*3/uL (ref 4.0–10.5)

## 2013-12-28 LAB — WET PREP, GENITAL
TRICH WET PREP: NONE SEEN
Yeast Wet Prep HPF POC: NONE SEEN

## 2013-12-28 LAB — COMPREHENSIVE METABOLIC PANEL
ALBUMIN: 4.1 g/dL (ref 3.5–5.2)
ALK PHOS: 75 U/L (ref 39–117)
ALT: 23 U/L (ref 0–35)
AST: 20 U/L (ref 0–37)
Anion gap: 10 (ref 5–15)
BILIRUBIN TOTAL: 0.3 mg/dL (ref 0.3–1.2)
BUN: 10 mg/dL (ref 6–23)
CHLORIDE: 104 meq/L (ref 96–112)
CO2: 29 mEq/L (ref 19–32)
Calcium: 9.2 mg/dL (ref 8.4–10.5)
Creatinine, Ser: 0.86 mg/dL (ref 0.50–1.10)
GFR calc Af Amer: >90 mL/min (ref >90)
GFR calc non Af Amer: >90 mL/min (ref >90)
Glucose, Bld: 94 mg/dL (ref 70–99)
POTASSIUM: 4 meq/L (ref 3.7–5.3)
SODIUM: 143 meq/L (ref 137–147)
TOTAL PROTEIN: 7.5 g/dL (ref 6.0–8.3)

## 2013-12-28 LAB — URINE MICROSCOPIC-ADD ON

## 2013-12-28 LAB — LIPASE, BLOOD: Lipase: 33 U/L (ref 11–59)

## 2013-12-28 MED ORDER — PROMETHAZINE HCL 25 MG PO TABS: 25.0000 mg | ORAL_TABLET | Freq: Four times a day (QID) | ORAL | Status: DC | PRN

## 2013-12-28 MED ORDER — NAPROXEN 500 MG PO TABS: 500.0000 mg | ORAL_TABLET | Freq: Two times a day (BID) | ORAL | Status: DC | PRN

## 2013-12-28 NOTE — ED Notes (Addendum)
Pt reports intermittent LLQ pain x 1 month. States "it feels like cramping." Pt reports N/V at 1500 today. Denies vaginal discharge/bleeding. Also reports urinary frequency and dysuria x 1 month. Denies hematuria. Reports hx of ovarian cyst, states this pain feels similar. LMP 9/14. NAD.

## 2013-12-28 NOTE — ED Notes (Signed)
Pt monitored by pulse ox and bp cuff. No 5-lead per PA.

## 2013-12-28 NOTE — Discharge Instructions (Signed)
Abdominal Pain, Women °Abdominal (stomach, pelvic, or belly) pain can be caused by many things. It is important to tell your doctor: °· The location of the pain. °· Does it come and go or is it present all the time? °· Are there things that start the pain (eating certain foods, exercise)? °· Are there other symptoms associated with the pain (fever, nausea, vomiting, diarrhea)? °All of this is helpful to know when trying to find the cause of the pain. °CAUSES  °· Stomach: virus or bacteria infection, or ulcer. °· Intestine: appendicitis (inflamed appendix), regional ileitis (Crohn's disease), ulcerative colitis (inflamed colon), irritable bowel syndrome, diverticulitis (inflamed diverticulum of the colon), or cancer of the stomach or intestine. °· Gallbladder disease or stones in the gallbladder. °· Kidney disease, kidney stones, or infection. °· Pancreas infection or cancer. °· Fibromyalgia (pain disorder). °· Diseases of the female organs: °¨ Uterus: fibroid (non-cancerous) tumors or infection. °¨ Fallopian tubes: infection or tubal pregnancy. °¨ Ovary: cysts or tumors. °¨ Pelvic adhesions (scar tissue). °¨ Endometriosis (uterus lining tissue growing in the pelvis and on the pelvic organs). °¨ Pelvic congestion syndrome (female organs filling up with blood just before the menstrual period). °¨ Pain with the menstrual period. °¨ Pain with ovulation (producing an egg). °¨ Pain with an IUD (intrauterine device, birth control) in the uterus. °¨ Cancer of the female organs. °· Functional pain (pain not caused by a disease, may improve without treatment). °· Psychological pain. °· Depression. °DIAGNOSIS  °Your doctor will decide the seriousness of your pain by doing an examination. °· Blood tests. °· X-rays. °· Ultrasound. °· CT scan (computed tomography, special type of X-ray). °· MRI (magnetic resonance imaging). °· Cultures, for infection. °· Barium enema (dye inserted in the large intestine, to better view it with  X-rays). °· Colonoscopy (looking in intestine with a lighted tube). °· Laparoscopy (minor surgery, looking in abdomen with a lighted tube). °· Major abdominal exploratory surgery (looking in abdomen with a large incision). °TREATMENT  °The treatment will depend on the cause of the pain.  °· Many cases can be observed and treated at home. °· Over-the-counter medicines recommended by your caregiver. °· Prescription medicine. °· Antibiotics, for infection. °· Birth control pills, for painful periods or for ovulation pain. °· Hormone treatment, for endometriosis. °· Nerve blocking injections. °· Physical therapy. °· Antidepressants. °· Counseling with a psychologist or psychiatrist. °· Minor or major surgery. °HOME CARE INSTRUCTIONS  °· Do not take laxatives, unless directed by your caregiver. °· Take over-the-counter pain medicine only if ordered by your caregiver. Do not take aspirin because it can cause an upset stomach or bleeding. °· Try a clear liquid diet (broth or water) as ordered by your caregiver. Slowly move to a bland diet, as tolerated, if the pain is related to the stomach or intestine. °· Have a thermometer and take your temperature several times a day, and record it. °· Bed rest and sleep, if it helps the pain. °· Avoid sexual intercourse, if it causes pain. °· Avoid stressful situations. °· Keep your follow-up appointments and tests, as your caregiver orders. °· If the pain does not go away with medicine or surgery, you may try: °¨ Acupuncture. °¨ Relaxation exercises (yoga, meditation). °¨ Group therapy. °¨ Counseling. °SEEK MEDICAL CARE IF:  °· You notice certain foods cause stomach pain. °· Your home care treatment is not helping your pain. °· You need stronger pain medicine. °· You want your IUD removed. °· You feel faint or   lightheaded. °· You develop nausea and vomiting. °· You develop a rash. °· You are having side effects or an allergy to your medicine. °SEEK IMMEDIATE MEDICAL CARE IF:  °· Your  pain does not go away or gets worse. °· You have a fever. °· Your pain is felt only in portions of the abdomen. The right side could possibly be appendicitis. The left lower portion of the abdomen could be colitis or diverticulitis. °· You are passing blood in your stools (bright red or black tarry stools, with or without vomiting). °· You have blood in your urine. °· You develop chills, with or without a fever. °· You pass out. °MAKE SURE YOU:  °· Understand these instructions. °· Will watch your condition. °· Will get help right away if you are not doing well or get worse. °Document Released: 01/10/2007 Document Revised: 07/30/2013 Document Reviewed: 01/30/2009 °[employer]® Patient Information ©2015 [employer]. This information is not intended to replace advice given to you by your health care provider. Make sure you discuss any questions you have with your health care provider. ° °

## 2013-12-28 NOTE — ED Provider Notes (Signed)
CSN: [id number]     Arrival date & time 12/28/13  1944 History   First MD Initiated Contact with Patient 12/28/13 2254     Chief Complaint  Patient presents with  . Abdominal Pain     (Consider location/radiation/quality/duration/timing/severity/associated sxs/prior Treatment) HPI  23 year old female with hx of gallstones s/p cholecystectomy, cystadenofibroma s/p laparoscopic ovarian cystectomy, hx of ovarian cyst presents c/o lower abd pain.  Patient reports she has been having intermittent low, cramping ongoing for the past several months. She described pain as a burning sensation, happen roughly 3 times a day and usually lasting for several minutes each. Report sometimes eating may aggravating the pain and other times lower abdominal pain is worsened with movement. She does endorse occasional nausea and vomiting when the pain is intense. She usually takes ibuprofen, and rest which usually helps with the pain. She had similar pain like this in the past when she was diagnosed with ovarian cyst. Today her pain was intense with increased frequency which prompted her to come to the ER for evaluation. Patient does report increased urinary frequency and mild discomfort which has been ongoing for the past month. Sts she has been treated for UTI many times in the past but abx usually does not help.  Her last menstrual period was September 14. Denies any prior history of STD. Denies any fever, chills, chest pain, trouble breathing, back pain, rectal bleeding, vaginal bleeding, vaginal discharge, or rash. Reports her abd pain is now minimal. She does have a primary care provider.  Past Medical History  Diagnosis Date  . Hemorrhoids   . Headache(784.0)     unspecified - 2-3 x/week  . Gallstone 11/2011  . Dental crowns present    Past Surgical History  Procedure Laterality Date  . Laparoscopic ovarian cystectomy  12/16/10    Right cystadenofibroma  . Cholecystectomy  12/09/2011    Procedure:  LAPAROSCOPIC CHOLECYSTECTOMY;  Surgeon: Haywood Lasso, MD;  Location: [facility];  Service: General;  Laterality: N/A;  . Breast surgery  2012    Fibroadenoma   Family History  Problem Relation Age of Onset  . Hypertension Maternal Grandmother   . Hyperlipidemia Father   . Thyroid disease Father   . Thyroid disease Brother   . Diabetes Paternal Grandmother    History  Substance Use Topics  . Smoking status: Never Smoker   . Smokeless tobacco: Never Used  . Alcohol Use: Yes     Comment: Rare   OB History   Grav Para Term Preterm Abortions TAB SAB Ect Mult Living   0              Review of Systems  All other systems reviewed and are negative.     Allergies  Other  Home Medications   Prior to Admission medications   Medication Sig Start Date End Date Taking? Authorizing Provider  norethindrone-ethinyl estradiol (MICROGESTIN,JUNEL,LOESTRIN) 1-20 MG-MCG tablet Take 1 tablet by mouth daily. 01/04/13   Anastasio Auerbach, MD   BP 113/77  Pulse 93  Temp(Src) 97.7 F (36.5 C) (Oral)  Resp 20  Ht 5\' 5"  (1.651 m)  Wt 185 lb (83.915 kg)  BMI 30.79 kg/m2  SpO2 97%  LMP 12/10/2013 Physical Exam  Nursing note and vitals reviewed. Constitutional: She is oriented to person, place, and time. She appears well-developed and well-nourished. No distress.  HENT:  Head: Normocephalic and atraumatic.  Eyes: Conjunctivae are normal.  Neck: Normal range of motion. Neck supple.  Cardiovascular: Normal rate and regular rhythm.   Pulmonary/Chest: Effort normal and breath sounds normal. She exhibits no tenderness.  Abdominal: Soft. There is no tenderness.  Genitourinary: Vagina normal and uterus normal. There is no rash or lesion on the right labia. There is no rash or lesion on the left labia. Cervix exhibits no motion tenderness, no discharge and no friability. Right adnexum displays no mass and no tenderness. Left adnexum displays tenderness. Left adnexum displays no  mass. No erythema, tenderness or bleeding around the vagina. No vaginal discharge found.  Chaperone present:  Lymphadenopathy:       Right: No inguinal adenopathy present.       Left: No inguinal adenopathy present.  Neurological: She is alert and oriented to person, place, and time.  Skin: Skin is warm. No rash noted.  Psychiatric: She has a normal mood and affect.    ED Course  Procedures (including critical care time)  11:22 PM Pt with recurrent lower abd pain, reproducible in L adnexa.  Hx of ovarian cyst.  Pt is afebrile, VSS, wet prep with many WBC but pt report not sexually active x 1 year.  Doubt PID, or ovarian torsion.  SD cultures sent.  Preg test negative, labs are reassuring, UA with large leukocytes without obvious infection.  Normal WBC.  Electrolytes are reassuring.  Given the chronicity of pt's complaint and no obvious emergent condition, plan to d/c with antiemetic, and NSAIDs for use as needed. Pt can f/u with PCP, return precaution discussed.  Pt voice understanding and agrees with plan.    Labs Review Labs Reviewed  WET PREP, GENITAL - Abnormal; Notable for the following:    Clue Cells Wet Prep HPF POC FEW (*)    WBC, Wet Prep HPF POC MANY (*)    All other components within normal limits  URINALYSIS, ROUTINE W REFLEX MICROSCOPIC - Abnormal; Notable for the following:    Leukocytes, UA LARGE (*)    All other components within normal limits  URINE MICROSCOPIC-ADD ON - Abnormal; Notable for the following:    Squamous Epithelial / LPF FEW (*)    Bacteria, UA FEW (*)    All other components within normal limits  GC/CHLAMYDIA PROBE AMP  CBC WITH DIFFERENTIAL  COMPREHENSIVE METABOLIC PANEL  LIPASE, BLOOD  PREGNANCY, URINE    Imaging Review No results found.   EKG Interpretation None      MDM   Final diagnoses:  Abdominal pain, chronic, left lower quadrant    BP 114/74  Pulse 96  Temp(Src) 97.7 F (36.5 C) (Oral)  Resp 18  Ht 5\' 5"  (1.651 m)  Wt  185 lb (83.915 kg)  BMI 30.79 kg/m2  SpO2 100%  LMP 12/10/2013  I have reviewed nursing notes and vital signs.  I reviewed available ER/hospitalization records thought the EMR     Domenic Moras, PA-C 12/28/13 [place], PA-C 12/29/13 [id number]

## 2013-12-29 NOTE — ED Provider Notes (Signed)
Medical screening examination/treatment/procedure(s) were performed by non-physician practitioner and as supervising physician I was immediately available for consultation/collaboration.    Johnna Acosta, MD 12/29/13 1136

## 2013-12-31 LAB — GC/CHLAMYDIA PROBE AMP
CT PROBE, AMP APTIMA: NEGATIVE
GC Probe RNA: NEGATIVE

## 2014-04-30 ENCOUNTER — Ambulatory Visit (INDEPENDENT_AMBULATORY_CARE_PROVIDER_SITE_OTHER): Payer: BLUE CROSS/BLUE SHIELD | Admitting: Gynecology

## 2014-04-30 ENCOUNTER — Encounter: Payer: Self-pay | Admitting: Gynecology

## 2014-04-30 VITALS — BP 112/76

## 2014-04-30 DIAGNOSIS — R300 Dysuria: Secondary | ICD-10-CM

## 2014-04-30 DIAGNOSIS — N926 Irregular menstruation, unspecified: Secondary | ICD-10-CM

## 2014-04-30 LAB — PREGNANCY, URINE: Preg Test, Ur: NEGATIVE

## 2014-04-30 LAB — URINALYSIS W MICROSCOPIC + REFLEX CULTURE
Bilirubin Urine: NEGATIVE
CASTS: NONE SEEN
Crystals: NONE SEEN
Glucose, UA: NEGATIVE mg/dL
NITRITE: NEGATIVE
PROTEIN: NEGATIVE mg/dL
Specific Gravity, Urine: 1.025 (ref 1.005–1.030)
Urobilinogen, UA: 0.2 mg/dL (ref 0.0–1.0)
pH: 5 (ref 5.0–8.0)

## 2014-04-30 NOTE — Patient Instructions (Signed)
Office will call you with the blood test results. Schedule an annual exam appointment so we can further discuss birth control options.

## 2014-04-30 NOTE — Progress Notes (Signed)
Emma Robbins 12/13/1990 [id number]        24 y.o.  G0P0 who has not been here for over one year having regular monthly menses through January when she was late by 1 week. Had unprotected intercourse 04/16/2014 and used plan B and then started spotting 04/23/2014. Also notes some mild dysuria. No frequency, urgency low back pain. No nausea vomiting diarrhea constipation. No lower abdominal discomfort or cramping. Had positive home pregnancy test this past week.  Past medical history,surgical history, problem list, medications, allergies, family history and social history were all reviewed and documented in the EPIC chart.  Directed ROS with pertinent positives and negatives documented in the history of present illness/assessment and plan.  Exam: Kim assistant General appearance:  Normal Spine straight without CVA tenderness Abdomen soft nontender without masses guarding rebound Pelvic external BUS vagina normal. Cervix normal. No active bleeding. Uterus grossly normal midline mobile nontender. Adnexa without masses or tenderness  Assessment/Plan:  24 y.o. G0P0 with the above history. UPT negative here. Urinalysis contaminated with many squamous cells 21-50 WBC 21-50 RBC. Will follow up on culture. Check quantitative hCG. Check ABO Rh. Various scenarios reviewed to include an early SAB or false positive home pregnancy test. Assuming resolving hCG were negative them will await spontaneous menses. Need for consistent contraception reviewed. Its approaching 2 years since her last annual exam. Strongly recommended patient schedule an annual exam we can further discuss contraceptive options and she agrees to do so.     Anastasio Auerbach MD, 3:36 PM 04/30/2014

## 2014-04-30 NOTE — Addendum Note (Signed)
Addended by: Nelva Nay on: 04/30/2014 04:04 PM   Modules accepted: Orders

## 2014-05-01 ENCOUNTER — Other Ambulatory Visit: Payer: Self-pay | Admitting: Gynecology

## 2014-05-01 LAB — HCG, QUANTITATIVE, PREGNANCY: hCG, Beta Chain, Quant, S: <2 m[IU]/mL

## 2014-05-01 LAB — ABO AND RH: RH TYPE: POSITIVE

## 2014-05-01 MED ORDER — MEDROXYPROGESTERONE ACETATE 10 MG PO TABS: 10.0000 mg | ORAL_TABLET | Freq: Every day | ORAL | Status: DC

## 2014-05-02 ENCOUNTER — Other Ambulatory Visit: Payer: Self-pay | Admitting: Gynecology

## 2014-05-02 MED ORDER — NITROFURANTOIN MONOHYD MACRO 100 MG PO CAPS: 100.0000 mg | ORAL_CAPSULE | Freq: Two times a day (BID) | ORAL | Status: DC

## 2014-05-03 LAB — URINE CULTURE

## 2014-05-08 ENCOUNTER — Other Ambulatory Visit: Payer: Self-pay | Admitting: Gynecology

## 2014-05-08 DIAGNOSIS — N3001 Acute cystitis with hematuria: Secondary | ICD-10-CM

## 2014-05-20 ENCOUNTER — Other Ambulatory Visit: Payer: BLUE CROSS/BLUE SHIELD

## 2014-05-20 DIAGNOSIS — N3001 Acute cystitis with hematuria: Secondary | ICD-10-CM

## 2014-05-22 LAB — URINE CULTURE

## 2014-06-19 ENCOUNTER — Telehealth: Payer: Self-pay | Admitting: *Deleted

## 2014-06-19 NOTE — Telephone Encounter (Signed)
Patient informed with urine results on 05/20/14.

## 2014-06-23 ENCOUNTER — Emergency Department (HOSPITAL_COMMUNITY)
Admission: EM | Admit: 2014-06-23 | Discharge: 2014-06-23 | Disposition: A | Discharge status: Home / Self Care | Payer: BLUE CROSS/BLUE SHIELD | Attending: Emergency Medicine | Admitting: Emergency Medicine

## 2014-06-23 ENCOUNTER — Emergency Department (HOSPITAL_COMMUNITY): Payer: BLUE CROSS/BLUE SHIELD

## 2014-06-23 ENCOUNTER — Encounter (HOSPITAL_COMMUNITY): Payer: Self-pay | Admitting: Nurse Practitioner

## 2014-06-23 DIAGNOSIS — R51 Headache: Secondary | ICD-10-CM | POA: Diagnosis not present

## 2014-06-23 DIAGNOSIS — Z8679 Personal history of other diseases of the circulatory system: Secondary | ICD-10-CM | POA: Insufficient documentation

## 2014-06-23 DIAGNOSIS — Z8719 Personal history of other diseases of the digestive system: Secondary | ICD-10-CM | POA: Insufficient documentation

## 2014-06-23 DIAGNOSIS — Z792 Long term (current) use of antibiotics: Secondary | ICD-10-CM | POA: Insufficient documentation

## 2014-06-23 DIAGNOSIS — R519 Headache, unspecified: Secondary | ICD-10-CM

## 2014-06-23 MED ORDER — KETOROLAC TROMETHAMINE 60 MG/2ML IM SOLN
60.0000 mg | Freq: Once | INTRAMUSCULAR | Status: AC
  Administered 2014-06-23: 60 mg via INTRAMUSCULAR
  Filled 2014-06-23: qty 2

## 2014-06-23 MED ORDER — DIPHENHYDRAMINE HCL 25 MG PO CAPS
50.0000 mg | ORAL_CAPSULE | Freq: Once | ORAL | Status: AC
  Administered 2014-06-23: 50 mg via ORAL
  Filled 2014-06-23: qty 2

## 2014-06-23 MED ORDER — METOCLOPRAMIDE HCL 10 MG PO TABS: 10.0000 mg | ORAL_TABLET | Freq: Four times a day (QID) | ORAL | Status: DC | PRN

## 2014-06-23 MED ORDER — METOCLOPRAMIDE HCL 5 MG/ML IJ SOLN
10.0000 mg | Freq: Once | INTRAMUSCULAR | Status: AC
  Administered 2014-06-23: 10 mg via INTRAMUSCULAR
  Filled 2014-06-23: qty 2

## 2014-06-23 NOTE — ED Notes (Signed)
She reports headache since Friday on the left side of her head. She denies vision changes. She tried ibuprofen with no relief. She has been tired and her eyes have been burning. She has gained weight over the past 2 weeks. She was told at some point that she had hypothyroidism but she never follwed up.

## 2014-06-23 NOTE — ED Provider Notes (Signed)
CSN: [id number]     Arrival date & time 06/23/14  1849 History   First MD Initiated Contact with Patient 06/23/14 1918     Chief Complaint  Patient presents with  . Headache      HPI Pt was seen at 1920. Per pt, c/o gradual onset and persistence of constant acute flair of her chronic migraine headache for the past 2 to 3 days. Describes the headache as on the left side of her forehead and top of her head. Pt endorses hx of chronic headaches. Denies headache was sudden or maximal in onset or at any time. Pt took OTC motrin without relief. Pt also c/o runny/stuffy nose, sinus congestion and "itchy eyes" for the past several days. Denies visual changes, no eye pain, no focal motor weakness, no tingling/numbness in extremities, no fevers, no neck pain, no rash, no sore throat.    Past Medical History  Diagnosis Date  . Hemorrhoids   . Headache(784.0)     unspecified - 2-3 x/week  . Gallstone 11/2011  . Dental crowns present    Past Surgical History  Procedure Laterality Date  . Laparoscopic ovarian cystectomy  12/16/10    Right cystadenofibroma  . Cholecystectomy  12/09/2011    Procedure: LAPAROSCOPIC CHOLECYSTECTOMY;  Surgeon: Haywood Lasso, MD;  Location: [facility];  Service: General;  Laterality: N/A;  . Breast surgery  2012    Fibroadenoma   Family History  Problem Relation Age of Onset  . Hypertension Maternal Grandmother   . Hyperlipidemia Father   . Thyroid disease Father   . Thyroid disease Brother   . Diabetes Paternal Grandmother    History  Substance Use Topics  . Smoking status: Never Smoker   . Smokeless tobacco: Never Used  . Alcohol Use: 0.0 oz/week    0 Standard drinks or equivalent per week     Comment: Rare   OB History    Gravida Para Term Preterm AB TAB SAB Ectopic Multiple Living   0              Review of Systems ROS: Statement: All systems negative except as marked or noted in the HPI; Constitutional: Negative for fever and  chills. ; ; Eyes: +"itchy eyes." Negative for eye pain, redness and discharge. ; ; ENMT: Negative for ear pain, hoarseness, +nasal congestion, sinus pressure. ; ; Cardiovascular: Negative for chest pain, palpitations, diaphoresis, dyspnea and peripheral edema. ; ; Respiratory: Negative for cough, wheezing and stridor. ; ; Gastrointestinal: Negative for nausea, vomiting, diarrhea, abdominal pain, blood in stool, hematemesis, jaundice and rectal bleeding. . ; ; Genitourinary: Negative for dysuria, flank pain and hematuria. ; ; Musculoskeletal: Negative for back pain and neck pain. Negative for swelling and trauma.; ; Skin: Negative for pruritus, rash, abrasions, blisters, bruising and skin lesion.; ; Neuro: +headache. Negative for lightheadedness and neck stiffness. Negative for weakness, altered level of consciousness , altered mental status, extremity weakness, paresthesias, involuntary movement, seizure and syncope.     Allergies  Other  Home Medications   Prior to Admission medications   Medication Sig Start Date End Date Taking? Authorizing Provider  medroxyPROGESTERone (PROVERA) 10 MG tablet Take 1 tablet (10 mg total) by mouth daily. 05/01/14   Anastasio Auerbach, MD  nitrofurantoin, macrocrystal-monohydrate, (MACROBID) 100 MG capsule Take 1 capsule (100 mg total) by mouth 2 (two) times daily. 05/02/14   Anastasio Auerbach, MD   BP 124/76 mmHg  Pulse 76  Temp(Src) 98.4 F (36.9 C) (  Oral)  Resp 16  SpO2 99% Physical Exam  1925: Physical examination:  Nursing notes reviewed; Vital signs and O2 SAT reviewed;  Constitutional: Well developed, Well nourished, Well hydrated, In no acute distress; Head:  Normocephalic, atraumatic; Eyes: EOMI, PERRL, No scleral icterus. No conjunctival injection.; ENMT: TM's clear bilat. +edemetous nasal turbinates bilat with clear rhinorrhea. Mouth and pharynx normal, Mucous membranes moist; Neck: Supple, Full range of motion, No lymphadenopathy; Cardiovascular:  Regular rate and rhythm, No gallop; Respiratory: Breath sounds clear & equal bilaterally, No wheezes.  Speaking full sentences with ease, Normal respiratory effort/excursion; Chest: Nontender, Movement normal; Abdomen: Soft, Nontender, Nondistended, Normal bowel sounds; Genitourinary: No CVA tenderness; Extremities: Pulses normal, No tenderness, No edema, No calf edema or asymmetry.; Neuro: AA&Ox3, Major CN grossly intact. Speech clear.  No facial droop.  No nystagmus. Grips equal. Strength 5/5 equal bilat UE's and LE's.  DTR 2/4 equal bilat UE's and LE's.  No gross sensory deficits.  Normal cerebellar testing bilat UE's (finger-nose) and LE's (heel-shin). Climbs on and off stretcher easily by herself. Gait steady.; Skin: Color normal, Warm, Dry. No rash.   ED Course  Procedures     EKG Interpretation None      MDM  MDM Reviewed: previous chart, nursing note and vitals Interpretation: CT scan   Ct Head Wo Contrast 06/23/2014   CLINICAL DATA:  Headaches  EXAM: CT HEAD WITHOUT CONTRAST  TECHNIQUE: Contiguous axial images were obtained from the base of the skull through the vertex without intravenous contrast.  COMPARISON:  None  FINDINGS: The bony calvarium is intact. The ventricles are of normal size and configuration. No findings to suggest acute hemorrhage, acute infarction or space-occupying mass lesion are noted.  IMPRESSION: No acute intracranial abnormality noted.   Electronically Signed   By: Inez Catalina M.D.   On: 06/23/2014 20:01    2130:  Pt states she feels better after meds and wants to go home now. No red flags on H&P. Will continue to tx symptomatically at this time.  Dx and testing d/w pt and family.  Questions answered.  Verb understanding, agreeable to d/c home with outpt f/u.   Francine Graven, DO 06/26/14 1357

## 2014-06-23 NOTE — ED Notes (Signed)
MD at bedside. 

## 2014-06-23 NOTE — Discharge Instructions (Signed)
°Emergency Department Resource Guide °1) Find a Doctor and Pay Out of Pocket °Although you won't have to find out who is covered by your insurance plan, it is a good idea to ask around and get recommendations. You will then need to call the office and see if the doctor you have chosen will accept you as a new patient and what types of options they offer for patients who are self-pay. Some doctors offer discounts or will set up payment plans for their patients who do not have insurance, but you will need to ask so you aren't surprised when you get to your appointment. ° °2) Contact Your Local Health Department °Not all health departments have doctors that can see patients for sick visits, but many do, so it is worth a call to see if yours does. If you don't know where your local health department is, you can check in your phone book. The CDC also has a tool to help you locate your state's health department, and many state websites also have listings of all of their local health departments. ° °3) Find a Walk-in Clinic °If your illness is not likely to be very severe or complicated, you may want to try a walk in clinic. These are popping up all over the country in pharmacies, drugstores, and shopping centers. They're usually staffed by nurse practitioners or physician assistants that have been trained to treat common illnesses and complaints. They're usually fairly quick and inexpensive. However, if you have serious medical issues or chronic medical problems, these are probably not your best option. ° °No Primary Care Doctor: °- Call [facility] at  832-8000 - they can help you locate a primary care doctor that  accepts your insurance, provides certain services, etc. °- Physician Referral Service- 1-800-533-3463 ° °Chronic Pain Problems: °Organization         Address  Phone   Notes  °[facility]  (336) 297-2271 Patients need to be referred by their primary care doctor.  ° °Medication  Assistance: °Organization         Address  Phone   Notes  °[place] [facility] [address] °[place], [place] [address] (336) 641-8030 --Must be a resident of [place] °-- Must have NO insurance coverage whatsoever (no Medicaid/ Medicare, etc.) °-- The pt. MUST have a primary care doctor that directs their care regularly and follows them in the community °  °[facility]  (866) 331-1348   °[employer]  (888) 892-1162   ° °Agencies that provide inexpensive medical care: °Organization         Address  Phone   Notes  °[facility]  (336) 832-8035   °[facility]    (336) 832-7272   °[facility] [address] °[place], [place] [address] (336) 832-4777   °[facility] [address], °[place] (336) 271-4999   °[facility]    (336) 373-0678   °[facility]    (336) 272-1050   °[facility] ° [address], [place] Phone:  (336) 832-4444, Fax:  (336) 832-4440 Hours of Operation:  9 am - 6 pm, M-F.  Also accepts Medicaid/Medicare and self-pay.  °[facility] ° [address], [place] Phone: (336) [phone], Fax: (336) [phone]. Hours of Operation:  8:30 am - 5:30 pm, M-F.  Also accepts Medicaid and self-pay.  °[facility] [place] [address], [place] Phone: (336) 878-6027   °[facility] [address], [place], [place] (336)723-1848, Ext. 123 Mondays & Thursdays: 7-9 AM.  First 15 patients are seen on a first come, first serve basis. °  ° °Medicaid-accepting [place] Providers: ° °Organization         Address  Phone   Notes  °[facility] [address], [place] (336) 641-2100 Also accepts self-pay patients.  °[facility] [address], [place] ° (336) 856-9996   °[facility] [address], [place]  (336) 288-8857   °[facility] [address] [place] [address], [place] (336) 299-7000   °Veita Bland [address], [place]  ° (336) 373-1557 Only accepts [place] Access Medicaid patients after they have their name applied to their card.  ° °Self-Pay (no insurance) in [place]: ° °Organization         Address  Phone   Notes  °Sickle Cell Patients, [facility] [address], [place] (336) 832-1970   °[facility] [address], [place] (336) 832-4400   °[facility] ° [address], [place] (336) 992-4800   °[facility]/Dr. Osei-Bonsu ° [address] [place] [address], [place] or [address], [place] (336) 841-8500 Phone number for both [place] and [place] locations is the same.  °[facility] [address], [place] (336) 299-0000   °[facility] [address] [place] [address], [place] or [address] (336) 852-7530 °(336) 878-2260   °[facility] [address], [place] (336) 350-1642, phone; (336) 294-5005, fax Sees patients 1st and 3rd Saturday of every month.  Must not qualify for public or private insurance (i.e. Medicaid, Medicare, [place] Health Choice, Veterans' Benefits) • Household income should be no more than 200% of the poverty level •The clinic cannot treat you if you are pregnant or think you are pregnant • Sexually transmitted diseases are not treated at the clinic.  ° ° °Dental Care: °Organization         Address  Phone  Notes  °[place] [facility] [address],  (336) 641-6152 Accepts children up to age 21 who are enrolled in Medicaid or [place] Health Choice; pregnant women with a Medicaid card; and children who have applied for Medicaid or [place] Health Choice, but were declined, whose parents can pay a reduced fee at time of service.  °[place]  [facility] [place]  [address], [place] (336) 641-7733 Accepts children up to age 21 who are enrolled in Medicaid or [place] Health Choice; pregnant women with a Medicaid card; and children who have applied for Medicaid or [place] Health Choice, but were declined, whose parents can pay a reduced fee at time of service.  °[facility] ° [address],  (336) 641-4533 Patients are seen by appointment only. Walk-ins are not accepted. [facility] will see patients 18 years of age and older. °Monday - Tuesday (8am-5pm) °Most Wednesdays (8:30-5pm) °$30 per visit, cash only  °[facility] ° [address], [place] (336) 641-4533 Patients are seen by appointment only. Walk-ins are not accepted. [facility] will see patients 18 years of age and older. °One   Wednesday Evening (Monthly: Volunteer Based).  $30 per visit, cash only  °[facility]  (919) [phone] for adults; Children under age 4, call Graduate Pediatric Dentistry at ([phone]) [phone]. Children aged 4-14, please call ([phone]) [phone] to request a pediatric application. ° Dental services are provided in all areas of dental care including fillings, crowns and bridges, complete and partial dentures, implants, gum treatment, root canals, and extractions. Preventive care is also provided. Treatment is provided to both adults and children. °Patients are selected via a lottery and there is often a waiting list. °  °[facility] [address], °[place] ° (336) 763-8833 www.drcivils.[email or web address] °  °[facility] [address], [place], [place] (336)723-1848, Ext. 123 Second and Fourth Thursday of each month, opens at 6:30 AM; Clinic ends at 9 AM.  Patients are seen on a first-come first-served basis, and a limited number are seen during each clinic.  ° °[facility] ° [address], [place], [place] (336) 723-7904    Eligibility Requirements °You must have lived in [place], or [place] counties for at least the last three months. °  You cannot be eligible for state or federal sponsored healthcare insurance, including Veterans Administration, Medicaid, or Medicare. °  You generally cannot be eligible for healthcare insurance through your employer.  °  How to apply: °Eligibility screenings are held every Tuesday and Wednesday afternoon from 1:00 pm until 4:00 pm. You do not need an appointment for the interview!  °[facility] [address], [place], [place] 336-631-2330   °[facility]  336-342-8273   °[facility]  336-703-3100   °[facility]  336-570-6415   ° °Behavioral Health Resources in the Community: °Intensive Outpatient Programs °Organization         Address  Phone  Notes  °[facility] [address], [place], [place] 336-878-6098   °[facility] [address], [place], [place] 336-832-9800   °[facility] [address], [place], [place] ° 336-882-2125   °[facility] [address],  °[place], [place] 1-800-853-5163 or 336-641-4981   °Substance Abuse Resources °Organization         Address  Phone  Notes  °[facility]  336-882-2125   °[facility]  336-784-9470   °[facility]  336-285-9073   °[facility]  336-845-3988   °Residential & Outpatient Substance Abuse Program  1-800-659-3381   °Psychological Services °Organization         Address  Phone  Notes  °[facility]  336- 832-9600   °[facility]  336- 378-7881   °[facility] [address], [place] 1-800-853-5163 or 336-641-4981   ° °Mobile Crisis Teams °Organization         Address  Phone  Notes  °[facility], Mobile Crisis Care Unit  1-877-626-1772   °Assertive °[facility] ° [address]  [place], [place] 336-834-9664   °Sharon DeEsch [address] °[place]  336-554-5454   ° °Self-Help/Support Groups °Organization         Address  Phone             Notes  °[facility]  - variety of support groups  336- 373-1402 Call for more information  °Narcotics Anonymous (NA), [facility] [address], °[place]   2 meetings at this location  ° °  Residential Treatment Programs Organization         Address  Phone  Notes  [facility] [address]    [place]  1-4128846899   [facility]  [address] [place] [id number], [place], [place]   [place], [place] [phone] Admissions: 8am-3pm M-F  [facility] [place] [address]    [place], [place] 505-397-6734   [facility] [address] [place], [place], [place]   [facility] [address]  [place], [place]   [facility] Intensive [address], [place], [place]   [facility] ([place] [place]  [place], [place] 1-8453063517 or (818)512-7162   [facility] ([facility] [address] [place], [place] Accepts Medicaid  [facility] [address],  [place] 1-(563) 295-6244 Substance Abuse/Addiction Treatment   [facility]         Address  Phone  Notes  [facility]  228-526-4340   Domenic Schwab, PhD [address] [place], [place]   785-804-8062 or 825-544-4008   [facility] [place], [place] 660-024-7201   [facility] [address] [place], [place] 919-234-9741 Insurance/Medicaid/sponsorship through [facility] [address]                                    [place], [place] [phone] [place] [address], [place] ([phone]    Dr. Adele Schilder  [phone]   [facility] Dept. 1) [address] [place] 2) [place] 3)  [address] [address], [place] [phone]  ([phone]   [place] 959-096-3810 or 270-008-1613 (After Hours)      Take over the counter tylenol, ibuprofen (OR Excedrin), and benadryl, as directed on packaging, with the prescription given to you today, as needed for headache.  Call your regular medical doctor tomorrow to schedule a follow up appointment within the next 2 to 3 days.  Return to the Emergency Department immediately sooner if worsening.

## 2014-07-04 ENCOUNTER — Ambulatory Visit (INDEPENDENT_AMBULATORY_CARE_PROVIDER_SITE_OTHER): Payer: BLUE CROSS/BLUE SHIELD | Admitting: Gynecology

## 2014-07-04 ENCOUNTER — Encounter: Payer: Self-pay | Admitting: Gynecology

## 2014-07-04 VITALS — BP 124/84 | Ht 64.0 in | Wt 198.0 lb

## 2014-07-04 DIAGNOSIS — R140 Abdominal distension (gaseous): Secondary | ICD-10-CM

## 2014-07-04 DIAGNOSIS — N926 Irregular menstruation, unspecified: Secondary | ICD-10-CM

## 2014-07-04 DIAGNOSIS — Z01419 Encounter for gynecological examination (general) (routine) without abnormal findings: Secondary | ICD-10-CM

## 2014-07-04 DIAGNOSIS — Z113 Encounter for screening for infections with a predominantly sexual mode of transmission: Secondary | ICD-10-CM

## 2014-07-04 LAB — CBC WITH DIFFERENTIAL/PLATELET
BASOS ABS: 0 10*3/uL (ref 0.0–0.1)
Basophils Relative: 0 % (ref 0–1)
Eosinophils Absolute: 0.1 10*3/uL (ref 0.0–0.7)
Eosinophils Relative: 1 % (ref 0–5)
HCT: 42.8 % (ref 36.0–46.0)
HEMOGLOBIN: 14.3 g/dL (ref 12.0–15.0)
LYMPHS ABS: 2.6 10*3/uL (ref 0.7–4.0)
LYMPHS PCT: 35 % (ref 12–46)
MCH: 32.1 pg (ref 26.0–34.0)
MCHC: 33.4 g/dL (ref 30.0–36.0)
MCV: 96.2 fL (ref 78.0–100.0)
MPV: 10.3 fL (ref 8.6–12.4)
Monocytes Absolute: 0.5 10*3/uL (ref 0.1–1.0)
Monocytes Relative: 6 % (ref 3–12)
NEUTROS ABS: 4.4 10*3/uL (ref 1.7–7.7)
Neutrophils Relative %: 58 % (ref 43–77)
Platelets: 298 10*3/uL (ref 150–400)
RBC: 4.45 MIL/uL (ref 3.87–5.11)
RDW: 13.1 % (ref 11.5–15.5)
WBC: 7.5 10*3/uL (ref 4.0–10.5)

## 2014-07-04 NOTE — Progress Notes (Signed)
Emma Robbins Aug 28, 1990 [id number]        23 y.o.  G0P0 for annual exam.  With several issues noted below.  Past medical history,surgical history, problem list, medications, allergies, family history and social history were all reviewed and documented as reviewed in the EPIC chart.  ROS:  Performed with pertinent positives and negatives included in the history, assessment and plan.   Additional significant findings :  none   Exam: Kim Counsellor Vitals:   07/04/14 1609  BP: 124/84  Height: 5\' 4"  (1.626 m)  Weight: 198 lb (89.812 kg)   General appearance:  Normal affect, orientation and appearance. Skin: Grossly normal HEENT: Without gross lesions.  No cervical or supraclavicular adenopathy. Thyroid normal.  Lungs:  Clear without wheezing, rales or rhonchi Cardiac: RR, without RMG Abdominal:  Soft, nontender, without masses, guarding, rebound, organomegaly or hernia Breasts:  Examined lying and sitting without masses, retractions, discharge or axillary adenopathy. Pelvic:  Ext/BUS/vagina normal  Cervix normal. GC/Chlamydia  Uterus anteverted, normal size, shape and contour, midline and mobile nontender   Adnexa  Without masses or tenderness    Anus and perineum  Normal   Rectovaginal  Normal sphincter tone without palpated masses or tenderness.    Assessment/Plan:  24 y.o. G0P0 female for annual exam.   1. Menstrual regularity. Patient was late for her period by 2 weeks. Had several slips of condoms and took plan B several times. Ultimately started 06/21/2014. Reports a positive home pregnancy test before she started. Similar history of this previously early February with a negative hCG here and blood type of O+.  Check qualitative hCG today. Keep menstrual calendar and as long as regular menses will follow. 2. Contraception. I reviewed the failure risk with condoms and my strong recommendation she consider alternatives. Pill, patch, ring, Nexplanon,  Depo-Provera, IUDs were reviewed. I strongly encouraged her to use a more reliable contraception. At this point the patient declined and prefers to continue to use condoms clearly understanding the failure risk. 3. Pelvic bloating. Patient notes some bloating on and off although she has been having irregularity in her cycles as noted above. We'll start with GYN ultrasound for pelvic surveillance. Assuming negative then plan expected management for now. 4. STD screening. Does have past history of gonorrhea. Wants screening. GC/Chlamydia, HIV, hepatitis B, hepatitis C, RPR ordered. Continue to use condoms to decrease STD risk. 5. Pap smear 2014. No Pap smear done today. Plan repeat Pap smear next year at three-year interval per current screening guidelines. 6. Breast health. SBE monthly reviewed. 7. Gardasil series. I recommended patient receive Gardasil series. Benefits of vaccination in the issues of HPV to include cervical cancer risk reviewed. Offered to initiate today but declined. Patient prefers to think about it will follow up if she decides to pursue this. 8. Health maintenance. Baseline CBC comprehensive metabolic panel urinalysis ordered. I also ordered a TSH due to a strong family history of thyroid dysfunction.     Anastasio Auerbach MD, 4:39 PM 07/04/2014

## 2014-07-04 NOTE — Patient Instructions (Signed)
Follow up for ultrasound as scheduled.  Think about receiving the Gardasil vaccine series.  HPV Vaccine Gardasil (Human Papillomavirus): What You Need to Know 1. What is HPV? Genital human papillomavirus (HPV) is the most common sexually transmitted virus in the [place]. More than half of sexually active men and women are infected with HPV at some time in their lives. About 20 million Americans are currently infected, and about 6 million more get infected each year. HPV is usually spread through sexual contact. Most HPV infections don't cause any symptoms, and go away on their own. But HPV can cause cervical cancer in women. Cervical cancer is the 2nd leading cause of cancer deaths among women around the world. In the [place], about 12,000 women get cervical cancer every year and about 4,000 are expected to die from it. HPV is also associated with several less common cancers, such as vaginal and vulvar cancers in women, and anal and oropharyngeal (back of the throat, including base of tongue and tonsils) cancers in both men and women. HPV can also cause genital warts and warts in the throat. There is no cure for HPV infection, but some of the problems it causes can be treated. 2. HPV vaccine: Why get vaccinated? The HPV vaccine you are getting is one of two vaccines that can be given to prevent HPV. It may be given to both males and females.  This vaccine can prevent most cases of cervical cancer in females, if it is given before exposure to the virus. In addition, it can prevent vaginal and vulvar cancer in females, and genital warts and anal cancer in both males and females. Protection from HPV vaccine is expected to be long-lasting. But vaccination is not a substitute for cervical cancer screening. Women should still get regular Pap tests. 3. Who should get this HPV vaccine and when? HPV vaccine is given as a 3-dose series  1st Dose: Now  2nd Dose: 1 to 2 months after Dose  1  3rd Dose: 6 months after Dose 1 Additional (booster) doses are not recommended. Routine vaccination  This HPV vaccine is recommended for girls and boys 61 or 24 years of age. It may be given starting at age 56. Why is HPV vaccine recommended at 28 or 24 years of age?  HPV infection is easily acquired, even with only one sex partner. That is why it is important to get HPV vaccine before any sexual contact takes place. Also, response to the vaccine is better at this age than at older ages. Catch-up vaccination This vaccine is recommended for the following people who have not completed the 3-dose series:   Females 56 through 24 years of age.  Males 36 through 24 years of age. This vaccine may be given to men 74 through 24 years of age who have not completed the 3-dose series. It is recommended for men through age 76 who have sex with men or whose immune system is weakened because of HIV infection, other illness, or medications.  HPV vaccine may be given at the same time as other vaccines. 4. Some people should not get HPV vaccine or should wait.  Anyone who has ever had a life-threatening allergic reaction to any component of HPV vaccine, or to a previous dose of HPV vaccine, should not get the vaccine. Tell your doctor if the person getting vaccinated has any severe allergies, including an allergy to yeast.  HPV vaccine is not recommended for pregnant women. However, receiving HPV vaccine  when pregnant is not a reason to consider terminating the pregnancy. Women who are breast feeding may get the vaccine.  People who are mildly ill when a dose of HPV is planned can still be vaccinated. People with a moderate or severe illness should wait until they are better. 5. What are the risks from this vaccine? This HPV vaccine has been used in the [place] and around the world for about six years and has been very safe. However, any medicine could possibly cause a serious problem, such as a severe  allergic reaction. The risk of any vaccine causing a serious injury, or death, is extremely small. Life-threatening allergic reactions from vaccines are very rare. If they do occur, it would be within a few minutes to a few hours after the vaccination. Several mild to moderate problems are known to occur with this HPV vaccine. These do not last long and go away on their own.  Reactions in the arm where the shot was given:  Pain (about 8 people in 10)  Redness or swelling (about 1 person in 4)  Fever:  Mild (100 F) (about 1 person in 10)  Moderate (102 F) (about 1 person in 63)  Other problems:  Headache (about 1 person in 3)  Fainting: Brief fainting spells and related symptoms (such as jerking movements) can happen after any medical procedure, including vaccination. Sitting or lying down for about 15 minutes after a vaccination can help prevent fainting and injuries caused by falls. Tell your doctor if the patient feels dizzy or light-headed, or has vision changes or ringing in the ears.  Like all vaccines, HPV vaccines will continue to be monitored for unusual or severe problems. 6. What if there is a serious reaction? What should I look for?  Look for anything that concerns you, such as signs of a severe allergic reaction, very high fever, or behavior changes. Signs of a severe allergic reaction can include hives, swelling of the face and throat, difficulty breathing, a fast heartbeat, dizziness, and weakness. These would start a few minutes to a few hours after the vaccination.  What should I do?  If you think it is a severe allergic reaction or other emergency that can't wait, call 9-1-1 or get the person to the nearest hospital. Otherwise, call your doctor.  Afterward, the reaction should be reported to the Vaccine Adverse Event Reporting System (VAERS). Your doctor might file this report, or you can do it yourself through the VAERS web site at www.vaers.SamedayNews.es, or by calling  317-792-1285. VAERS is only for reporting reactions. They do not give medical advice. 7. The National Vaccine Injury Compensation Program  The [employer] Vaccine Injury Compensation Program (VICP) is a federal program that was created to compensate people who may have been injured by certain vaccines.  Persons who believe they may have been injured by a vaccine can learn about the program and about filing a claim by calling (769) 152-8959 or visiting the [place] website at GoldCloset.com.ee. 8. How can I learn more?  Ask your doctor.  Call your local or state health department.  Contact the Centers for Disease Control and Prevention (CDC):  Call 702-791-4734 (1-800-CDC-INFO)  or  Visit CDC's website at http://hunter.com/ CDC Human Papillomavirus (HPV) Gardasil (Interim) 08/13/11 Document Released: 01/10/2006 Document Revised: 07/30/2013 Document Reviewed: 04/26/2013 ExitCare Patient Information 2015 [place], [place]. This information is not intended to replace advice given to you by your health care provider. Make sure you discuss any questions you have with your  health care provider.  

## 2014-07-05 ENCOUNTER — Telehealth: Payer: Self-pay | Admitting: *Deleted

## 2014-07-05 LAB — COMPREHENSIVE METABOLIC PANEL
ALBUMIN: 4.4 g/dL (ref 3.5–5.2)
ALK PHOS: 72 U/L (ref 39–117)
ALT: 25 U/L (ref 0–35)
AST: 18 U/L (ref 0–37)
BUN: 14 mg/dL (ref 6–23)
CHLORIDE: 103 meq/L (ref 96–112)
CO2: 28 mEq/L (ref 19–32)
Calcium: 9.4 mg/dL (ref 8.4–10.5)
Creat: 0.85 mg/dL (ref 0.50–1.10)
Glucose, Bld: 79 mg/dL (ref 70–99)
POTASSIUM: 3.8 meq/L (ref 3.5–5.3)
SODIUM: 139 meq/L (ref 135–145)
TOTAL PROTEIN: 6.7 g/dL (ref 6.0–8.3)
Total Bilirubin: 0.4 mg/dL (ref 0.2–1.2)

## 2014-07-05 LAB — URINALYSIS W MICROSCOPIC + REFLEX CULTURE
BACTERIA UA: NONE SEEN
Bilirubin Urine: NEGATIVE
CRYSTALS: NONE SEEN
Casts: NONE SEEN
Glucose, UA: NEGATIVE mg/dL
Hgb urine dipstick: NEGATIVE
KETONES UR: NEGATIVE mg/dL
LEUKOCYTES UA: NEGATIVE
NITRITE: NEGATIVE
PH: 6.5 (ref 5.0–8.0)
PROTEIN: NEGATIVE mg/dL
SPECIFIC GRAVITY, URINE: 1.028 (ref 1.005–1.030)
Urobilinogen, UA: 0.2 mg/dL (ref 0.0–1.0)

## 2014-07-05 LAB — GC/CHLAMYDIA PROBE AMP
CT Probe RNA: NEGATIVE
GC Probe RNA: NEGATIVE

## 2014-07-05 LAB — TSH: TSH: 2.891 u[IU]/mL (ref 0.350–4.500)

## 2014-07-05 LAB — HIV ANTIBODY (ROUTINE TESTING W REFLEX): HIV: NONREACTIVE

## 2014-07-05 LAB — HEPATITIS C ANTIBODY: HCV AB: NEGATIVE

## 2014-07-05 LAB — HEPATITIS B SURFACE ANTIGEN: Hepatitis B Surface Ag: NEGATIVE

## 2014-07-05 LAB — RPR

## 2014-07-05 LAB — HCG, SERUM, QUALITATIVE: PREG SERUM: NEGATIVE

## 2014-07-05 NOTE — Telephone Encounter (Signed)
Pt called for results from yesterday. LM on VM that all were normal. KW CMA

## 2014-07-18 ENCOUNTER — Ambulatory Visit (INDEPENDENT_AMBULATORY_CARE_PROVIDER_SITE_OTHER): Payer: BLUE CROSS/BLUE SHIELD | Admitting: Gynecology

## 2014-07-18 ENCOUNTER — Ambulatory Visit (INDEPENDENT_AMBULATORY_CARE_PROVIDER_SITE_OTHER): Payer: BLUE CROSS/BLUE SHIELD

## 2014-07-18 ENCOUNTER — Encounter: Payer: Self-pay | Admitting: Gynecology

## 2014-07-18 ENCOUNTER — Other Ambulatory Visit: Payer: Self-pay | Admitting: Gynecology

## 2014-07-18 VITALS — BP 116/70

## 2014-07-18 DIAGNOSIS — N831 Corpus luteum cyst of ovary, unspecified side: Secondary | ICD-10-CM

## 2014-07-18 DIAGNOSIS — R102 Pelvic and perineal pain: Secondary | ICD-10-CM | POA: Diagnosis not present

## 2014-07-18 DIAGNOSIS — R140 Abdominal distension (gaseous): Secondary | ICD-10-CM | POA: Diagnosis not present

## 2014-07-18 MED ORDER — IBUPROFEN 800 MG PO TABS: 800.0000 mg | ORAL_TABLET | Freq: Three times a day (TID) | ORAL | Status: DC | PRN

## 2014-07-18 NOTE — Progress Notes (Addendum)
Emma Robbins 18-Dec-1990 [id number]        23 y.o.  G0P0  Presents for ultrasound due to pelvic bloating.  Recently had her annual exam with normal exam, normal lab work and negative STD screening.  Past medical history,surgical history, problem list, medications, allergies, family history and social history were all reviewed and documented in the EPIC chart.  Directed ROS with pertinent positives and negatives documented in the history of present illness/assessment and plan.  Exam: Filed Vitals:   07/18/14 1534  BP: 116/70   Ultrasound shows uterus normal size and echotexture. Endometrial echo 11.5 mm. Right ovary normal. Left ovary with thick walled cyst 27 mm mean with peripheral flow consistent with corpus luteal cyst. Minimal fluid in cul-de-sac 23 x 13 mm.  Assessment/Plan:  24 y.o. G0P0 with normal ultrasound and exam.  Recommended observation at this time. If her pelvic bloating would continue she will follow up and I reviewed possibilities of laparoscopy rule out endometriosis with her. Assuming her pain resolves then she'll follow up in one year for annual exam. I also asked her about contraception and encouraged her to consider this if she is not actively pursuing pregnancy but she declines at this time.  Ibuprofen 800 mg #40 refill 1 provided to have for pain.    Anastasio Auerbach MD, 3:53 PM 07/18/2014

## 2014-07-18 NOTE — Patient Instructions (Signed)
Follow up if pelvic pain persists. Otherwise follow up in one year for annual exam.

## 2014-07-22 ENCOUNTER — Emergency Department (HOSPITAL_COMMUNITY)
Admission: EM | Admit: 2014-07-22 | Discharge: 2014-07-22 | Disposition: A | Discharge status: Home / Self Care | Payer: BLUE CROSS/BLUE SHIELD | Attending: Emergency Medicine | Admitting: Emergency Medicine

## 2014-07-22 ENCOUNTER — Encounter (HOSPITAL_COMMUNITY): Payer: Self-pay | Admitting: Emergency Medicine

## 2014-07-22 DIAGNOSIS — Z8719 Personal history of other diseases of the digestive system: Secondary | ICD-10-CM | POA: Insufficient documentation

## 2014-07-22 DIAGNOSIS — Y92410 Unspecified street and highway as the place of occurrence of the external cause: Secondary | ICD-10-CM | POA: Diagnosis not present

## 2014-07-22 DIAGNOSIS — Y9389 Activity, other specified: Secondary | ICD-10-CM | POA: Insufficient documentation

## 2014-07-22 DIAGNOSIS — M25561 Pain in right knee: Secondary | ICD-10-CM

## 2014-07-22 DIAGNOSIS — S3992XA Unspecified injury of lower back, initial encounter: Secondary | ICD-10-CM | POA: Diagnosis present

## 2014-07-22 DIAGNOSIS — Y998 Other external cause status: Secondary | ICD-10-CM | POA: Diagnosis not present

## 2014-07-22 DIAGNOSIS — S8992XA Unspecified injury of left lower leg, initial encounter: Secondary | ICD-10-CM | POA: Insufficient documentation

## 2014-07-22 DIAGNOSIS — S8991XA Unspecified injury of right lower leg, initial encounter: Secondary | ICD-10-CM | POA: Insufficient documentation

## 2014-07-22 DIAGNOSIS — S39012A Strain of muscle, fascia and tendon of lower back, initial encounter: Secondary | ICD-10-CM | POA: Insufficient documentation

## 2014-07-22 DIAGNOSIS — M25562 Pain in left knee: Secondary | ICD-10-CM

## 2014-07-22 MED ORDER — NAPROXEN 500 MG PO TABS: 500.0000 mg | ORAL_TABLET | Freq: Two times a day (BID) | ORAL | Status: DC

## 2014-07-22 MED ORDER — METHOCARBAMOL 500 MG PO TABS: 500.0000 mg | ORAL_TABLET | Freq: Two times a day (BID) | ORAL | Status: DC
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
## 2014-10-15 ENCOUNTER — Encounter: Payer: Self-pay | Admitting: Women's Health

## 2014-10-15 ENCOUNTER — Ambulatory Visit (INDEPENDENT_AMBULATORY_CARE_PROVIDER_SITE_OTHER): Payer: BLUE CROSS/BLUE SHIELD | Admitting: Women's Health

## 2014-10-15 DIAGNOSIS — N912 Amenorrhea, unspecified: Secondary | ICD-10-CM | POA: Diagnosis not present

## 2014-10-15 MED ORDER — MEDROXYPROGESTERONE ACETATE 10 MG PO TABS: 10.0000 mg | ORAL_TABLET | Freq: Every day | ORAL | Status: DC

## 2014-10-15 NOTE — Progress Notes (Signed)
Patient ID: Emma Robbins, female   DOB: [birth year], 24 y.o.   MRN: [id number] Presents with amenorrhea.  LMP either March or April. 2 home UPT's negative. Usually has a regular monthly cycle/condoms.   Occasional missed cycle but never more than 1 month. Same partner negative STD screen. Normal TSH 06/2014.  Ultrasound 06/2014 probable corpus luteum cyst with pelvic discomfort , pain has resolved.  Denies vaginal discharge, urinary symptoms, abdominal pain.   exam: Appears well. Abdomen soft nontender no rebound or radiation of discomfort. No CVAT. External genitalia within normal limits, speculum exams no visible discharge. Bimanual uterus small nontender no adnexal tenderness or fullness.   Secondary Amenorrhea   Plan: qualitative hCG , if negative Provera 10 for 5 days prescription given instructed to call for results.  Prolactin pending.  Contraception options reviewed and declined will continue condoms.

## 2014-10-15 NOTE — Patient Instructions (Signed)
Secondary Amenorrhea  Secondary amenorrhea is the stopping of menstrual flow for 3-6 months in a female who has previously had periods. There are many possible causes. Most of these causes are not serious. Usually, treating the underlying problem causing the loss of menses will return your periods to normal. CAUSES  Some common and uncommon causes of not menstruating include:  Malnutrition.  Low blood sugar (hypoglycemia).  Polycystic ovary disease.  Stress or fear.  Breastfeeding.  Hormone imbalance.  Ovarian failure.  Medicines.  Extreme obesity.  Cystic fibrosis.  Low body weight or drastic weight reduction from any cause.  Early menopause.  Removal of ovaries or uterus.  Contraceptives.  Illness.  Long-term (chronic) illnesses.  Cushing syndrome.  Thyroid problems.  Birth control pills, patches, or vaginal rings for birth control. RISK FACTORS You may be at greater risk of secondary amenorrhea if:  You have a family history of this condition.  You have an eating disorder.  You do athletic training. DIAGNOSIS  A diagnosis is made by your health care provider taking a medical history and doing a physical exam. This will include a pelvic exam to check for problems with your reproductive organs. Pregnancy must be ruled out. Often, numerous blood tests are done to measure different hormones in the body. Urine testing may be done. Specialized exams (ultrasound, CT scan, MRI, or hysteroscopy) may have to be done as well as measuring the body mass index (BMI). TREATMENT  Treatment depends on the cause of the amenorrhea. If an eating disorder is present, this can be treated with an adequate diet and therapy. Chronic illnesses may improve with treatment of the illness. Amenorrhea may be corrected with medicines, lifestyle changes, or surgery. If the amenorrhea cannot be corrected, it is sometimes possible to create a false menstruation with medicines. HOME CARE  INSTRUCTIONS  Maintain a healthy diet.  Manage weight problems.  Exercise regularly but not excessively.  Get adequate sleep.  Manage stress.  Be aware of changes in your menstrual cycle. Keep a record of when your periods occur. Note the date your period starts, how long it lasts, and any problems. SEEK MEDICAL CARE IF: Your symptoms do not get better with treatment. Document Released: 04/26/2006 Document Revised: 11/15/2012 Document Reviewed: 08/31/2012 [facility] Patient Information 2015 [place], [place]. This information is not intended to replace advice given to you by your health care provider. Make sure you discuss any questions you have with your health care provider.

## 2014-10-16 ENCOUNTER — Encounter: Payer: Self-pay | Admitting: Women's Health

## 2014-10-16 LAB — HCG, SERUM, QUALITATIVE: Preg, Serum: NEGATIVE

## 2014-10-16 LAB — PROLACTIN: Prolactin: 12.1 ng/mL

## 2017-01-03 ENCOUNTER — Ambulatory Visit (INDEPENDENT_AMBULATORY_CARE_PROVIDER_SITE_OTHER): Payer: PRIVATE HEALTH INSURANCE | Admitting: Gynecology

## 2017-01-03 ENCOUNTER — Encounter: Payer: Self-pay | Admitting: Gynecology

## 2017-01-03 VITALS — BP 120/72 | Ht 64.0 in | Wt 199.0 lb

## 2017-01-03 DIAGNOSIS — N898 Other specified noninflammatory disorders of vagina: Secondary | ICD-10-CM | POA: Diagnosis not present

## 2017-01-03 DIAGNOSIS — Z13220 Encounter for screening for lipoid disorders: Secondary | ICD-10-CM

## 2017-01-03 DIAGNOSIS — K649 Unspecified hemorrhoids: Secondary | ICD-10-CM

## 2017-01-03 DIAGNOSIS — Z113 Encounter for screening for infections with a predominantly sexual mode of transmission: Secondary | ICD-10-CM

## 2017-01-03 DIAGNOSIS — Z01411 Encounter for gynecological examination (general) (routine) with abnormal findings: Secondary | ICD-10-CM | POA: Diagnosis not present

## 2017-01-03 LAB — WET PREP FOR TRICH, YEAST, CLUE

## 2017-01-03 MED ORDER — CLINDAMYCIN PHOSPHATE 2 % VA CREA: 1.0000 | TOPICAL_CREAM | Freq: Every day | VAGINAL | 0 refills | Status: DC

## 2017-01-03 NOTE — Progress Notes (Signed)
    Emma Robbins July 13, 1990 [id number]        25 y.o.  G0P0 for annual gynecologic exam.  Has not been in the office for several years. Notes a heavy watery type discharge of the last several months. No odor or significant itching. No urinary symptoms such as frequency dysuria or urgency low back pain fever or chills. Also notes having issues with hemorrhoids over the last several months. Is having some issues with constipation. Having some bleeding with bowel movements. A lot of discomfort also with bowel movements. No nausea vomiting diarrhea.  Past medical history,surgical history, problem list, medications, allergies, family history and social history were all reviewed and documented as reviewed in the EPIC chart.  ROS:  Performed with pertinent positives and negatives included in the history, assessment and plan.   Additional significant findings :  None   Exam: Caryn Bee assistant Vitals:   01/03/17 1434  BP: 120/72  Weight: 199 lb (90.3 kg)  Height: 5\' 4"  (1.626 m)   Body mass index is 34.16 kg/m.  General appearance:  Normal affect, orientation and appearance. Skin: Grossly normal HEENT: Without gross lesions.  No cervical or supraclavicular adenopathy. Thyroid normal.  Lungs:  Clear without wheezing, rales or rhonchi Cardiac: RR, without RMG Abdominal:  Soft, nontender, without masses, guarding, rebound, organomegaly or hernia Breasts:  Examined lying and sitting without masses, retractions, discharge or axillary adenopathy. Pelvic:  Ext, BUS, Vagina: With watery whitish discharge  Cervix: Normal. Pap smear done. GC/chlamydia screen  Uterus: Anteverted, normal size, shape and contour, midline and mobile nontender   Adnexa: Without masses or tenderness    Anus and perineum: Normal excepting small hemorrhoid 9:00 position anal verge   Rectal exam: Normal sphincter tone without palpated masses or tenderness.    Assessment/Plan:  26 y.o. G0P0 female for  annual gynecologic exam with regular menses, condom contraception.   1. Small hemorrhoid. Having issues with constipation. Reviewed need to have more frequent softer stooling. Recommended OTC brand type medication such as FiberCon/Metamucil. Plenty of water daily. OTC products such as Anusol or Preparation H suppositories for hemorrhoids discomfort. Follow up if discomfort or hemorrhoid continues. 2. Vaginal discharge.  Wet prep is unremarkable. Symptoms and exam are consistent with low-level bacterial vaginosis. Options for management reviewed and ultimately we decided on Cleocin vaginal cream nightly 7 nights. Patient will follow up if symptoms persist, worsen or recur. 3. Contraception. Patient using condoms and happy with this. Increased failure risk has been discussed with the patient. Availability of Plan B. 4. Gardasil series again recommended and patient acknowledges of recommendation. Not ready to initiate today. 5. Breast health. SBE monthly reviewed. Breast exam normal today. 6. STD screening. GC/Chlamydia, hepatitis B, hepatitis C, HIV, RPR ordered. 7. Pap smear 2014. Pap smear done today. 8. Health maintenance. Baseline CBC, CMP, lipid profile ordered with above STD screening blood work. Follow up in one year, sooner as needed.  Additional time in excess of her routine gynecologic exam was spent in direct face to face counseling and coordination of care in regards to her vaginal discharge and hemorrhoids.    Anastasio Auerbach MD, 2:55 PM 01/03/2017

## 2017-01-03 NOTE — Patient Instructions (Signed)
Use the prescribed vaginal cream nightly for 7 nights. Follow up if the vaginal discharge continues.  Follow up in one year for annual exam

## 2017-01-04 ENCOUNTER — Encounter: Payer: BLUE CROSS/BLUE SHIELD | Admitting: Gynecology

## 2017-01-04 LAB — COMPREHENSIVE METABOLIC PANEL
AG Ratio: 1.8 (calc) (ref 1.0–2.5)
ALT: 34 U/L — ABNORMAL HIGH (ref 6–29)
AST: 21 U/L (ref 10–30)
Albumin: 4.4 g/dL (ref 3.6–5.1)
Alkaline phosphatase (APISO): 68 U/L (ref 33–115)
BUN: 9 mg/dL (ref 7–25)
CO2: 26 mmol/L (ref 20–32)
Calcium: 9.7 mg/dL (ref 8.6–10.2)
Chloride: 103 mmol/L (ref 98–110)
Creat: 0.91 mg/dL (ref 0.50–1.10)
Globulin: 2.5 g/dL (calc) (ref 1.9–3.7)
Glucose, Bld: 89 mg/dL (ref 65–99)
Potassium: 4.8 mmol/L (ref 3.5–5.3)
Sodium: 140 mmol/L (ref 135–146)
Total Bilirubin: 0.6 mg/dL (ref 0.2–1.2)
Total Protein: 6.9 g/dL (ref 6.1–8.1)

## 2017-01-04 LAB — LIPID PANEL
Cholesterol: 160 mg/dL (ref <200)
HDL: 44 mg/dL — ABNORMAL LOW (ref >50)
LDL Cholesterol (Calc): 95 mg/dL (calc)
Non-HDL Cholesterol (Calc): 116 mg/dL (calc) (ref <130)
Total CHOL/HDL Ratio: 3.6 (calc) (ref <5.0)
Triglycerides: 114 mg/dL (ref <150)

## 2017-01-04 LAB — HEPATITIS C ANTIBODY
Hepatitis C Ab: NONREACTIVE
SIGNAL TO CUT-OFF: 0.01 (ref <1.00)

## 2017-01-04 LAB — CBC WITH DIFFERENTIAL/PLATELET
Basophils Absolute: 42 cells/uL (ref 0–200)
Basophils Relative: 0.6 %
Eosinophils Absolute: 77 cells/uL (ref 15–500)
Eosinophils Relative: 1.1 %
HCT: 40.5 % (ref 35.0–45.0)
Hemoglobin: 14 g/dL (ref 11.7–15.5)
Lymphs Abs: 2660 cells/uL (ref 850–3900)
MCH: 32.6 pg (ref 27.0–33.0)
MCHC: 34.6 g/dL (ref 32.0–36.0)
MCV: 94.2 fL (ref 80.0–100.0)
MPV: 10.7 fL (ref 7.5–12.5)
Monocytes Relative: 4.7 %
Neutro Abs: 3892 cells/uL (ref 1500–7800)
Neutrophils Relative %: 55.6 %
Platelets: 298 10*3/uL (ref 140–400)
RBC: 4.3 10*6/uL (ref 3.80–5.10)
RDW: 11.9 % (ref 11.0–15.0)
Total Lymphocyte: 38 %
WBC mixed population: 329 cells/uL (ref 200–950)
WBC: 7 10*3/uL (ref 3.8–10.8)

## 2017-01-04 LAB — RPR: RPR Ser Ql: NONREACTIVE

## 2017-01-04 LAB — HIV ANTIBODY (ROUTINE TESTING W REFLEX): HIV: NONREACTIVE

## 2017-01-04 LAB — HEPATITIS B SURFACE ANTIGEN: Hepatitis B Surface Ag: NONREACTIVE

## 2017-01-05 LAB — URINE CULTURE
MICRO NUMBER:: 81122739
SPECIMEN QUALITY:: ADEQUATE

## 2017-01-05 LAB — URINALYSIS W MICROSCOPIC + REFLEX CULTURE
BILIRUBIN URINE: NEGATIVE
Bacteria, UA: NONE SEEN /HPF
GLUCOSE, UA: NEGATIVE
Hgb urine dipstick: NEGATIVE
KETONES UR: NEGATIVE
NITRITES URINE, INITIAL: NEGATIVE
Protein, ur: NEGATIVE
RBC / HPF: NONE SEEN /HPF (ref 0–2)
SPECIFIC GRAVITY, URINE: 1.015 (ref 1.001–1.03)
SQUAMOUS EPITHELIAL / LPF: NONE SEEN /HPF (ref <=5)
WBC, UA: NONE SEEN /HPF (ref 0–5)
pH: 6 (ref 5.0–8.0)

## 2017-01-05 LAB — PAP IG W/ RFLX HPV ASCU

## 2017-01-05 LAB — C. TRACHOMATIS/N. GONORRHOEAE RNA
C. trachomatis RNA, TMA: NOT DETECTED
N. gonorrhoeae RNA, TMA: NOT DETECTED

## 2017-01-05 LAB — CULTURE INDICATED

## 2017-01-06 ENCOUNTER — Telehealth: Payer: Self-pay | Admitting: *Deleted

## 2017-01-06 NOTE — Telephone Encounter (Signed)
Pt informed with negative STD panel on 01/03/17.

## 2017-03-04 ENCOUNTER — Ambulatory Visit: Payer: BLUE CROSS/BLUE SHIELD | Admitting: Physician Assistant

## 2017-03-04 ENCOUNTER — Encounter: Payer: Self-pay | Admitting: Physician Assistant

## 2017-03-04 ENCOUNTER — Other Ambulatory Visit: Payer: Self-pay

## 2017-03-04 VITALS — BP 109/80 | HR 74 | Temp 97.9°F | Resp 16 | Ht 64.0 in | Wt 199.6 lb

## 2017-03-04 DIAGNOSIS — J329 Chronic sinusitis, unspecified: Secondary | ICD-10-CM | POA: Diagnosis not present

## 2017-03-04 DIAGNOSIS — R6889 Other general symptoms and signs: Secondary | ICD-10-CM

## 2017-03-04 DIAGNOSIS — B9789 Other viral agents as the cause of diseases classified elsewhere: Secondary | ICD-10-CM

## 2017-03-04 LAB — POC INFLUENZA A&B (BINAX/QUICKVUE)
Influenza A, POC: NEGATIVE
Influenza B, POC: NEGATIVE

## 2017-03-04 MED ORDER — PSEUDOEPHEDRINE-GUAIFENESIN ER 120-1200 MG PO TB12: 1.0000 | ORAL_TABLET | Freq: Two times a day (BID) | ORAL | 0 refills | Status: DC

## 2017-03-04 NOTE — Patient Instructions (Addendum)
Stay well hydrated. Let me know if you are not improving in 7-10 days.  See below for home care tips, including nasal rinses.  Do not use Afrin longer than 3 days. If you feel like you are benefiting from nasal sprays, you may try Flonase.   ACUTE VIRAL RHINOSINUSITIS-Patients with acute viral rhinosinusitis (AVRS) should be managed with supportive care. There are no treatments to shorten the clinical course of the disease.  Natural history-AVRS may not completely resolve within 10 days but is expected to improve. Patients who fail to improve after ?10 days of symptomatic management are more likely to have acute bacterial rhinosinusitis. Symptomatic therapies-Symptomatic management of acute rhinosinusitis (ARS) aims to relieve symptoms of nasal obstruction and runny nose as well as the systemic signs and symptoms such as fever and fatigue. When needed, we suggest over-the-counter (OTC) analgesics and antipyretics, saline irrigation, and intranasal glucocorticoids for symptomatic management in patients with ARS. Analgesics and antipyretics-OTC analgesics and antipyretics such as nonsteroidal anti-inflammatory drugs and acetaminophen can be used for pain and fever relief as needed. Saline irrigation-Mechanical irrigation with saline may reduce the need for pain medication and improve overall patient comfort, particularly in patients with frequent sinus infections. It is important that irrigants be prepared from sterile or bottled water. (See below for instructions)  Intranasal glucocorticoids- Intranasal glucocorticoids are likely to be most beneficial for patients with underlying allergies. This allows improved sinus drainage. A higher dose of intranasal glucocorticoids had a stronger effect on symptom improvement. -Other- ?Oral decongestants - Oral decongestants may be useful when eustachian tube dysfunction is a factor for patients with AVRS. These patients may benefit from a short course  (three to five days) of oral decongestants. Oral decongestants should be used with caution in patients with cardiovascular disease, hypertension, angle-closure glaucoma, or bladder neck obstruction. ?Intranasal decongestants - Intranasal decongestants are often used as symptomatic therapies by patients. These agents, such as oxymetazoline, may provide a subjective sense of improved nasal patency. There is also concern that intranasal decongestants themselves may provoke mucosal inflammation. If used, topical decongestants should be used sparingly for no more than three consecutive days to avoid rebound congestion, addiction, and mucosal damage associated with long-term use. ?Antihistamines - Antihistamines are frequently used for symptom relief due to their drying effects; however, there are no studies investigating their efficacy for ARS. Over-drying of the mucosa may lead to further discomfort. Additionally, antihistamines are often associated with adverse effects.  ?Mucolytics - Mucolytics such as guaifenesin serve to thin secretions and may promote ease of mucus drainage and clearance.   SALINE NASAL IRRIGATION  The benefits  1. Saline (saltwater) washes the mucus and irritants from your nose.  2. The sinus passages are moisturized.  3. Studies have also shown that a nasal irrigation improves cell function (the cells that move the mucus work better).  The recipe  Use a one-quart glass jar that is thoroughly cleansed.  You may use a large medical syringe (30 cc), water pick with an irrigation tip (preferred method), squeeze bottle, or Neti pot. Do not use a baby bulb syringe. The syringe or pick should be sterilized frequently or replaced every two to three weeks to avoid contamination and infection.  Fill with water that has been distilled, previously boiled, or otherwise sterilized. Plain tap water is not recommended, because it is not necessarily sterile.  Add 1 to 1 heaping teaspoons of  pickling/canning salt. Do not use table salt, because it contains a large number of additives.  Add 1 teaspoon of baking soda (pure bicarbonate).  Mix ingredients together, and store at room temperature. Discard after one week.  You may also make up a solution from premixed packets that are commercially prepared specifically for nasal irrigation.  The instructions  Irrigate your nose with saline one to two times per day.   If you have been told to use nasal medication, you should always use your saline solution first. The nasal medication is much more effective when sprayed onto clean nasal membranes, and the spray will reach deeper into the nose.   Pour the amount of fluid you plan to use into a clean bowl. Do not put your used syringe back into the storage container, because it contaminates your solution.   You may warm the solution slightly in the microwave, but be sure that the solution is not hot.   Bend over the sink (some people do this in the shower), and squirt the solution into each side of your nose, aiming the stream toward the back of your head, not the top of your head. The solution should flow into one nostril and out of the other, but it will not harm you if you swallow a little.   Some people experience a little burning sensation the first few times that they use buffered saline solution, but this usually goes away after they adapt to it.    Thank you for coming in today. I hope you feel we met your needs.  Feel free to call PCP if you have any questions or further requests.  Please consider signing up for MyChart if you do not already have it, as this is a great way to communicate with me.  Best,  Whitney McVey, PA-C   IF you received an x-ray today, you will receive an invoice from [facility]. Please contact [facility] at [phone] with questions or concerns regarding your invoice.   IF you received labwork today, you will receive an invoice from  [place]. Please contact [facility] at [phone] with questions or concerns regarding your invoice.   Our billing staff will not be able to assist you with questions regarding bills from these companies.  You will be contacted with the lab results as soon as they are available. The fastest way to get your results is to activate your My Chart account. Instructions are located on the last page of this paperwork. If you have not heard from [place] regarding the results in 2 weeks, please contact this office.

## 2017-03-04 NOTE — Progress Notes (Signed)
Emma Robbins  MRN: [id number] DOB: Oct 05, 1990  PCP: Anastasio Auerbach, MD  Subjective:  Pt is a 26 year old female who presents to clinic for nasal drainage x 1 week.  Endorses watery eyes x 1 week, nasal drainage and ear pain. She did the doc-on-demand a few days ago and was prescribed tamiflu. She started taking it this morning.  She is not feeling any better. Denies fever, chills, sinus pain, sinus swelling, ear pain, ear drainage, cough, sore throat.   Review of Systems  Constitutional: Negative for chills, diaphoresis, fatigue and fever.  HENT: Positive for congestion, postnasal drip, rhinorrhea and sinus pressure. Negative for sinus pain and sore throat.   Respiratory: Negative for cough, chest tightness, shortness of breath and wheezing.   Cardiovascular: Negative for chest pain and palpitations.  Psychiatric/Behavioral: Negative for sleep disturbance.    Patient Active Problem List   Diagnosis Date Noted  . Acute cystitis 12/31/2008  . OBESITY 09/04/2007  . BULIMIA NERVOSA 09/04/2007  . ALLERGIC CONJUNCTIVITIS 09/04/2007    Current Outpatient Medications on File Prior to Visit  Medication Sig Dispense Refill  . ibuprofen (ADVIL,MOTRIN) 800 MG tablet Take 1 tablet (800 mg total) by mouth every 8 (eight) hours as needed. 40 tablet 1  . levothyroxine (SYNTHROID, LEVOTHROID) 50 MCG tablet Take 50 mcg by mouth daily before breakfast.    . oseltamivir (TAMIFLU) 75 MG capsule Take 75 mg by mouth.     No current facility-administered medications on file prior to visit.     Allergies  Allergen Reactions  . Other Nausea And Vomiting    Sweet tea     Objective:  BP 109/80   Pulse 74   Temp 97.9 F (36.6 C) (Oral)   Resp 16   Ht 5\' 4"  (1.626 m)   Wt 199 lb 9.6 oz (90.5 kg)   LMP 02/20/2017   SpO2 97%   BMI 34.26 kg/m   Physical Exam  Constitutional: She is oriented to person, place, and time and well-developed, well-nourished, and in no distress.  No distress.  HENT:  Right Ear: Tympanic membrane normal.  Left Ear: Tympanic membrane normal.  Nose: Mucosal edema present. No rhinorrhea. Right sinus exhibits no maxillary sinus tenderness and no frontal sinus tenderness. Left sinus exhibits no maxillary sinus tenderness and no frontal sinus tenderness.  Mouth/Throat: Oropharynx is clear and moist and mucous membranes are normal.  Cardiovascular: Normal rate, regular rhythm and normal heart sounds.  Pulmonary/Chest: Effort normal and breath sounds normal. No respiratory distress. She has no wheezes. She has no rales.  Neurological: She is alert and oriented to person, place, and time. GCS score is 15.  Skin: Skin is warm and dry.  Psychiatric: Mood, memory, affect and judgment normal.  Vitals reviewed.   Results for orders placed or performed in visit on 03/04/17  POC Influenza A&B (Binax test)  Result Value Ref Range   Influenza A, POC Negative Negative   Influenza B, POC Negative Negative    Assessment and Plan :  1. Viral sinusitis - Pseudoephedrine-Guaifenesin (MUCINEX D MAX STRENGTH) [phone] MG TB12; Take 1 tablet by mouth 2 (two) times daily.  Dispense: 20 each; Refill: 0 - CMA did flu swab due to symptoms. Negative. Suspect viral sinusitis. Plan to treat supportively - advised nasal rinses, hydration and mucinex. RTC in 7-10 days if no improvement. Consider antibiotics.  2. Flu-like symptoms - POC Influenza A&B (Binax test)  Mercer Pod, PA-C  Primary Care at [facility] 03/04/2017 4:51 PM

## 2017-08-10 ENCOUNTER — Ambulatory Visit (INDEPENDENT_AMBULATORY_CARE_PROVIDER_SITE_OTHER): Payer: BLUE CROSS/BLUE SHIELD | Admitting: Physician Assistant

## 2017-08-10 ENCOUNTER — Other Ambulatory Visit: Payer: Self-pay

## 2017-08-10 ENCOUNTER — Encounter: Payer: Self-pay | Admitting: Physician Assistant

## 2017-08-10 VITALS — BP 104/68 | HR 86 | Temp 98.6°F | Ht 64.0 in | Wt 203.6 lb

## 2017-08-10 DIAGNOSIS — F4323 Adjustment disorder with mixed anxiety and depressed mood: Secondary | ICD-10-CM

## 2017-08-10 HISTORY — DX: Adjustment disorder with mixed anxiety and depressed mood: F43.23

## 2017-08-10 NOTE — Patient Instructions (Addendum)
Thank you for letting me be a part of your well being.   No psychological or psychiatric services take physician referrals - they always want the patient to call. Some excellent private psychiatrists for individual counseling are:  [facility] [address] [place], [place]  [place] - ([place] (female) [place] (female) [place], [place], [place] [address]  Phone: [phone]  [place]  [address], [place], [place] [address]  [phone]  [place] [address], [place], [place] [address]  [phone]) [place] [address] [place], [id number] Phone: [place]  [address] [place]ynne [place], [place] [address]  Phone:(336) [place], [place], [place], [place] , [place] [address] Phone: [phone]   You can always go to [facility] if suicidal or there are walk-in crisis services available at: [facility] [place], [place] [address] [phone]  Hour of operations: Monday-Friday, 8:30-5 p.m.  They take medicare/medicaid and the patient can contact [facility] referral department at 9407958845 or [email or web address] [email or web address] for more information.    Feel free to call PCP if you have any questions or further requests.  Please consider signing up for MyChart if you do not already have it, as this is a great way to communicate with me.  Best,  Whitney McVey, PA-C    IF you received an x-ray today, you will receive an invoice from [facility]. Please contact [facility] at [phone] with questions or concerns regarding your invoice.   IF you received labwork today, you will receive an invoice from [place]. Please contact [facility] at  [phone] with questions or concerns regarding your invoice.   Our billing staff will not be able to assist you with questions regarding bills from these companies.  You will be contacted with the lab results as soon as they are available. The fastest way to get your results is to activate your My Chart account. Instructions are located on the last page of this paperwork. If you have not heard from [place] regarding the results in 2 weeks, please contact this office.

## 2017-08-10 NOTE — Progress Notes (Signed)
Emma Robbins  MRN: [id number] DOB: 1990/11/05  PCP: Anastasio Auerbach, MD  Subjective:  Pt is a pleasant 27 year old female who presents to clinic for anxiety and depression. Anxiety and depression first started in 2014 after her divorse. She was not treated. She moved to [place] and started hiking - which helped a lot.   Anxiety recently started back up due to stress at work - works at a very busy bank. She moved back here and runs into her ex-husband often. She and her husband were well known in the [place] and she runs into people who bring up the past.  Anxiety is "extreme" to the point that I feel like I can't breathe anymore, I shake a lot. Sometimes she does not have period bc of stress. Hard to breath.  Lately she feels like she doesn't want to wake up to go to work.  Not sleeping well - she wakes up easily. She sleeps about 5 hours/night.  She has had SI, no plans. Denies HI.  She tries to stay busy and be around people.  She has been drinking a lot more recently- 3 drinks/week. Admits to cocaine use once at a party three months ago.  She is not interested in starting medication for anxiety. She would like to try therapy first.   Pt  has a past medical history of Anxiety, Depression, Gallstone (11/2011), Headache(784.0), Hemorrhoids, and Thyroid disease.  Review of Systems  Psychiatric/Behavioral: Positive for dysphoric mood and sleep disturbance. Negative for self-injury and suicidal ideas. The patient is nervous/anxious.     Patient Active Problem List   Diagnosis Date Noted  . Acute cystitis 12/31/2008  . OBESITY 09/04/2007  . BULIMIA NERVOSA 09/04/2007  . ALLERGIC CONJUNCTIVITIS 09/04/2007    Current Outpatient Medications on File Prior to Visit  Medication Sig Dispense Refill  . ibuprofen (ADVIL,MOTRIN) 800 MG tablet Take 1 tablet (800 mg total) by mouth every 8 (eight) hours as needed. (Patient not taking: Reported on  08/10/2017) 40 tablet 1  . levothyroxine (SYNTHROID, LEVOTHROID) 50 MCG tablet Take 50 mcg by mouth daily before breakfast.     No current facility-administered medications on file prior to visit.     Allergies  Allergen Reactions  . Other Nausea And Vomiting    Sweet tea     Objective:  BP 104/68 (BP Location: Left Arm, Patient Position: Sitting, Cuff Size: Normal)   Pulse 86   Temp 98.6 F (37 C) (Oral)   Ht 5\' 4"  (1.626 m)   Wt 203 lb 9.6 oz (92.4 kg)   LMP 07/11/2017   SpO2 99%   BMI 34.95 kg/m   Physical Exam  Constitutional: She is oriented to person, place, and time. No distress.  Cardiovascular: Normal rate, regular rhythm and normal heart sounds.  Neurological: She is alert and oriented to person, place, and time.  Skin: Skin is warm and dry.  Psychiatric: Judgment normal.  Vitals reviewed.   Assessment and Plan :  1. Adjustment disorder with mixed anxiety and depressed mood - Pt presents c/o worsening anxiety off and on for the past 4-5 years. Depression score 26.  Admits to SI, however does not take these thoughts seriously and does not have a plan. Denies HI. She is not interested in starting medication today. Information provided for local therapy clinics. RTC in 2 months for recheck. Spent 20 mins with the patient - greater than 50% of the visit was face to face counseling patient  regarding anxiety and depression.    Mercer Pod, PA-C  Primary Care at [place] 08/10/2017 3:14 PM

## 2017-10-11 ENCOUNTER — Ambulatory Visit: Payer: BLUE CROSS/BLUE SHIELD | Admitting: Physician Assistant

## 2018-12-18 ENCOUNTER — Encounter: Payer: Self-pay | Admitting: Gynecology

## 2019-05-14 ENCOUNTER — Encounter: Payer: PRIVATE HEALTH INSURANCE | Admitting: Women's Health

## 2020-01-02 ENCOUNTER — Ambulatory Visit: Payer: BLUE CROSS/BLUE SHIELD

## 2020-01-02 DIAGNOSIS — O0990 Supervision of high risk pregnancy, unspecified, unspecified trimester: Secondary | ICD-10-CM | POA: Insufficient documentation

## 2020-01-02 DIAGNOSIS — Z3400 Encounter for supervision of normal first pregnancy, unspecified trimester: Secondary | ICD-10-CM

## 2020-01-02 MED ORDER — BLOOD PRESSURE KIT DEVI: 1.0000 | 0 refills | Status: DC | PRN

## 2020-01-02 NOTE — Progress Notes (Signed)
..    Virtual Visit via Telephone Note  I connected with Emma Robbins on 01/02/20 at  9:00 AM EDT by telephone and verified that I am speaking with the correct person using two identifiers.  Location:[facility] Patient: Emma Robbins Provider: Nurse   I discussed the limitations, risks, security and privacy concerns of performing an evaluation and management service by telephone and the availability of in person appointments. I also discussed with the patient that there may be a patient responsible charge related to this service. The patient expressed understanding and agreed to proceed.   History of Present Illness: PRENATAL INTAKE SUMMARY  Ms. Derenda Mis presents today New OB Nurse Interview.  OB History    Gravida  1   Para      Term      Preterm      AB      Living  0     SAB      TAB      Ectopic      Multiple      Live Births             I have reviewed the patient's medical, obstetrical, social, and family histories, medications, and available lab results.  SUBJECTIVE She has no unusual complaints. Late to care. Pt states that she had an U/S at [facility] at 5 weeks. Hx of hypothyroidism, and anxiety. Pt reports used cocaine 2 years ago, denies any other drug use. Patient is married.   Observations/Objective: Initial nurse interview for history/labs (New OB)  EDD: 08-13-19 GA: [redacted]w[redacted]d GP: G1P0  GENERAL APPEARANCE: alert, well appearing  Assessment and Plan: Normal pregnancy PHQ-9= 0  Babyscripts, blood pressure cuff, and anatomy U/S ordered Follow Up Instructions:   I discussed the assessment and treatment plan with the patient. The patient was provided an opportunity to ask questions and all were answered. The patient agreed with the plan and demonstrated an understanding of the instructions.   The patient was advised to call back or seek an in-person evaluation if the symptoms worsen or if the  condition fails to improve as anticipated.  I provided 15 minutes of non-face-to-face time during this encounter.   Hinton Lovely, RN

## 2020-01-08 ENCOUNTER — Other Ambulatory Visit (HOSPITAL_COMMUNITY)
Admission: RE | Admit: 2020-01-08 | Discharge: 2020-01-08 | Disposition: A | Discharge status: Home / Self Care | Payer: Medicaid Other | Source: Ambulatory Visit | Attending: Obstetrics | Admitting: Obstetrics

## 2020-01-08 ENCOUNTER — Encounter: Payer: Self-pay | Admitting: Obstetrics

## 2020-01-08 ENCOUNTER — Ambulatory Visit (INDEPENDENT_AMBULATORY_CARE_PROVIDER_SITE_OTHER): Payer: Medicaid Other | Admitting: Obstetrics

## 2020-01-08 VITALS — BP 108/76 | HR 81 | Wt 199.8 lb

## 2020-01-08 DIAGNOSIS — Z3A21 21 weeks gestation of pregnancy: Secondary | ICD-10-CM | POA: Diagnosis not present

## 2020-01-08 DIAGNOSIS — O99282 Endocrine, nutritional and metabolic diseases complicating pregnancy, second trimester: Secondary | ICD-10-CM

## 2020-01-08 DIAGNOSIS — Z3400 Encounter for supervision of normal first pregnancy, unspecified trimester: Secondary | ICD-10-CM | POA: Diagnosis present

## 2020-01-08 DIAGNOSIS — E039 Hypothyroidism, unspecified: Secondary | ICD-10-CM

## 2020-01-08 DIAGNOSIS — M549 Dorsalgia, unspecified: Secondary | ICD-10-CM

## 2020-01-08 DIAGNOSIS — Z3482 Encounter for supervision of other normal pregnancy, second trimester: Secondary | ICD-10-CM

## 2020-01-08 DIAGNOSIS — O219 Vomiting of pregnancy, unspecified: Secondary | ICD-10-CM

## 2020-01-08 DIAGNOSIS — O99280 Endocrine, nutritional and metabolic diseases complicating pregnancy, unspecified trimester: Secondary | ICD-10-CM

## 2020-01-08 MED ORDER — LEVOTHYROXINE SODIUM 25 MCG PO TABS: 25.0000 ug | ORAL_TABLET | Freq: Every day | ORAL | 11 refills | Status: DC

## 2020-01-08 MED ORDER — COMFORT FIT MATERNITY SUPP SM MISC: 0 refills | Status: DC

## 2020-01-08 MED ORDER — DOXYLAMINE-PYRIDOXINE 10-10 MG PO TBEC: DELAYED_RELEASE_TABLET | ORAL | 5 refills | Status: DC

## 2020-01-08 NOTE — Patient Instructions (Signed)
Dolor de Doctor, general practice Back Pain in Pregnancy El dolor de espalda es habitual durante el [place]. Puede deberse a varios factores relacionados con los cambios durante esta etapa. Siga estas indicaciones en su casa: Control del dolor, el entumecimiento y la hinchazn      Si se lo indican, para el dolor de espalda repentino (agudo), aplique hielo en la zona dolorida. ? Ponga el hielo en una bolsa plstica. ? Coloque una [employer] piel y Therapist, nutritional. ? Coloque el hielo durante 6minutos, 2a3veces al da.  Si se lo indican, aplique calor en la zona afectada antes de realizar ejercicios. Use la fuente de calor que el mdico le recomiende, como una compresa de calor hmedo o una almohadilla trmica. ? Coloque una [employer] piel y la fuente de Freight forwarder. ? Aplique calor durante 20 a 45minutos. ? Retire la fuente de calor si la piel se pone de color rojo brillante. Esto es especialmente importante si no puede sentir dolor, calor o fro. Puede correr un riesgo mayor de sufrir quemaduras.  Si se lo indican, aplique un masaje en la zona afectada. Actividad  Haga ejercicio como se lo haya indicado el mdico. Hacer actividad fsica suave es la mejor forma de evitar o controlar el dolor de espalda.  Prstele atencin a su cuerpo cuando se levante. Si siente dolor al levantarse, pida ayuda o flexione las rodillas. eBay, se usan los msculos de las piernas en lugar de los de la espalda.  Pngase en cuclillas al levantar algo del suelo. No se agache.  Haga reposo en cama nicamente por perodos breves como se lo haya indicado el mdico. El reposo en cama solo debe hacerse cuando los episodios de dolor de espalda son ms intensos. Pararse, sentarse y acostarse  No permanezca sentada o de pie en el mismo lugar durante largos perodos.  Cuando est sentada, adopte una Patent examiner. Asegrese de que su cabeza descanse sobre sus hombros y no est colgando hacia  delante. Use una almohada en la parte inferior de la espalda si es necesario.  Trate de dormir de lado, de preferencia del lado izquierdo, con una almohada de sostn para embarazadas o 1 o 2 almohadas comunes entre las piernas. ? Si tiene Social research officer, government de espalda despus de una noche de descanso, la cama puede ser [employer]. ? Un colchn duro puede brindarle ms apoyo para la Doctor, general practice. Indicaciones generales  No use zapatos con tacones altos.  Siga una dieta saludable. Trate de aumentar de peso dentro de las recomendaciones del mdico.  Use una faja de maternidad, un arns elstico o un cors para la espalda como se lo haya indicado el mdico.  Tome los medicamentos de venta libre y los recetados solamente como se lo haya indicado el mdico.  Animal nutritionist con un fisioterapeuta o un masajista para Licensed conveyancer de Financial controller de espalda. La acupuntura o la terapia de masajes pueden ser tiles.  Concurra a todas las visitas de control como se lo haya indicado el mdico. Esto es importante. Comunquese con un mdico si:  El dolor de Wellsite geologist impide [employer] cotidianas.  Aumenta el dolor en otras partes del cuerpo. Solicite ayuda inmediatamente si:  Siente entumecimiento, hormigueo, debilidad o problemas con el uso de los brazos o las piernas.  Siente un dolor de espalda intenso que no puede controlar con los medicamentos.  Presenta modificaciones en el control de la vejiga o el intestino.  Siente que le falta el aire, se marea o se desmaya.  Tiene nuseas, vmitos o sudoracin.  Siente un dolor de espalda que es rtmico y de tipo clico, similar a las contracciones del [place]. Las contracciones del parto suelen aparecer cada 1 a 22minutos, duran aproximadamente 18minuto y estn acompaadas de una sensacin de empujar o de presin en la pelvis.  Tiene dolor de espalda y rompe la bolsa de las aguas o tiene sangrado vaginal.  El dolor o el  adormecimiento se extienden hacia la pierna.  El dolor aparece despus de una cada.  Siente dolor de un solo lado.  Observa sangre en la orina.  Le aparecen ampollas en la piel en la zona del dolor de espalda. Resumen  Puede deberse a varios factores relacionados con los cambios durante esta etapa.  Siga las indicaciones del mdico para Financial controller, la rigidez y la hinchazn.  Haga ejercicio como se lo haya indicado el mdico. Hacer actividad fsica suave es la mejor forma de evitar o controlar el dolor de espalda.  Tome los medicamentos de venta libre y los recetados solamente como se lo haya indicado el mdico.  Consulting civil engineer a todas las visitas de control como se lo haya indicado el mdico. Esto es importante. Esta informacin no tiene Marine scientist el consejo del mdico. Asegrese de hacerle al mdico cualquier pregunta que tenga. Document Revised: 10/24/2017 Document Reviewed: 10/24/2017 Elsevier Patient Education  2020 [place] estomacal durante el embarazo Heartburn During Pregnancy  La acidez estomacal es el dolor o el malestar que se siente en la garganta o en el pecho. Puede causar una sensacin de ardor. Se produce cuando el cido estomacal sube por el tubo que lleva los alimentos desde la boca hasta el estmago (esfago). [place] estomacal es frecuente durante el [place]. Por lo general, desaparece o disminuye luego de parir. Siga estas indicaciones en su casa: Comida y bebida  Si est embarazada, no beba alcohol.  Identifique los alimentos y las bebidas que hacen que se Environmental manager, y evtelos.  Las bebidas que debe evitar incluyen las siguientes: ? Caf y t (con o sin cafena). ? Bebidas energticas y deportivas. ? Bebidas gaseosas y refrescos. ? Jugos de frutas ctricas.  Los alimentos que debe evitar incluyen los siguientes: ? Chocolate y cacao. ? Menta y [place]. ? Ajo, cebolla y rbano picante. ? Alimentos cidos y  condimentados. Estos incluyen todos los tipos de pimientas, [place] en polvo, curry en polvo, vinagre, salsas picantes y [employer]. ? Ctricos, como naranjas, limones o limas. ? Alimentos a base de tomate, como salsa roja, [place] y [employer]. ? Alimentos fritos y [place], [place] donas, papas fritas y aderezos ricos en grasas. ? Carnes con alto contenido de grasa, como perros calientes, fiambres, salchicha, jamn y tocino. ? Productos lcteos con alto contenido de grasa, como [place], [place] y [place].  Haga comidas pequeas y frecuentes en lugar de comidas abundantes.  Evite beber [place] comidas.  Evite comer 2 o 3horas antes de acostarse.  Evite recostarse inmediatamente despus de comer.  No haga ejercicios enseguida despus de comer. Medicamentos  [employer] de venta libre y los recetados solamente como se lo haya indicado el mdico.  No tome aspirina, ibuprofeno ni otros antiinflamatorios no esteroides (AINE) a menos que el mdico se lo indique.  El mdico puede indicarle que evite los medicamentos que contienen bicarbonato de [place]. Instrucciones generales   Si se  lo indicaron, levante la cabecera de la cama aproximadamente 6pulgadas (15cm). Para hacerlo, puede colocar tacos debajo de las patas de la cama. Dormir con ms almohadas no ayuda con la Merchant navy officer.  No consuma ningn producto que contenga nicotina o tabaco, como cigarrillos y Psychologist, sport and exercise. Si necesita ayuda para dejar de fumar, consulte al mdico.  Use ropa holgada.  Trate de reducir el nivel de estrs practicando actividades como yoga o meditacin. Si necesita ayuda, consulte al mdico.  Mantenga un peso saludable. Si tiene sobrepeso, trabaje con el mdico para adelgazar sin riegos.  Concurra a todas las visitas de control como se lo haya indicado el mdico. Esto es importante. Comunquese con un mdico si:  Tiene sntomas nuevos.  Los sntomas no mejoran  con Dispensing optician.  Jaconita y no sabe por qu.  Tiene dificultad para tragar.  Emitir sonidos fuertes al respirar (sibilancias).  Tiene tos que no desaparece.  Tiene acidez estomacal con frecuencia durante ms de 2semanas.  Siente Higher education careers adviser (nuseas), y esto no mejora con Dispensing optician.  Tiene vmitos que no mejoran con tratamiento.  Siente dolor en el vientre (abdomen). Solicite ayuda de inmediato si:  Siente un dolor muy intenso en el pecho que se extiende al brazo, el cuello o la [place].  Se siente transpirado, mareado o tiene una sensacin de desvanecimiento.  Tiene dificultad para respirar.  Siente dolor al tragar.  Vomita y el vmito tiene un aspecto similar a la sangre o a la borra del caf.  Las heces son sanguinolentas o negras. Esta informacin no tiene Marine scientist el consejo del mdico. Asegrese de hacerle al mdico cualquier pregunta que tenga. Document Revised: 11/08/2016 Document Reviewed: 08/28/2015 Elsevier Patient Education  2020 [place] de alimentacin para Games developer Eating Plan for Pregnant Women Durante el [place], [place] cuerpo requiere nutricin adicional para brindar sustento al beb que est creciendo. Adems, usted tiene ms necesidad de recibir algunas vitaminas y Rea, como cido flico, calcio, hierro y vitamina D. Una dieta saludable y bien equilibrada es muy importante para su salud y la de su beb. Su necesidad de recibir caloras extra vara para los tres segmentos de 3 meses de su embarazo (trimestres). Para la mayora de las mujeres se recomienda consumir lo siguiente:  150caloras extra por Consulting civil engineer trimestre.  300caloras extra por [employer] trimestre.  300caloras extra por Engineer, materials trimestre. Cules son algunos consejos para seguir este plan?   No trate de Horticulturist, commercial o hacer una dieta durante el embarazo.  Limite el consumo general de  alimentos con "caloras vacas". Estos son alimentos con Estate manager/land agent nutritivo como los [place], postres, caramelos, [place] endulzadas con azcar.  Consuma una variedad de alimentos (en especial frutas y verduras) para obtener una gama completa de vitaminas y minerales.  Tome una vitamina prenatal para ayudar a Engineer, water las necesidades adicionales de vitaminas y Health and safety inspector, especficamente de cido flico, hierro, calcio y vitamina D.  Haga actividad fsica. Consulte al mdico qu tipos de ejercicios y actividades son seguros para usted.  Cuide la salubridad y la higiene en relacin con los alimentos. Lvese las manos antes de comer y despus de preparar carne cruda. Lave bien todas las frutas y verduras antes de pelarlas o comerlas. Tomar estas medidas puede ayudar a evitar las enfermedades de origen alimentario que pueden ser muy peligrosas para el beb, como la listeriosis. Pdale al mdico ms informacin sobre esta  enfermedad. Opciones con 150 caloras extra Las opciones saludables con 150caloras extra diarias podran ser cualquiera de las siguientes:  6 a 8onzas (170 a 230g) de yogur natural con bajo contenido de grasa con taza de frutos rojos.  57manzana con 2cucharaditas (11 g) de mantequilla de man.  Verduras cortadas en trozos con de taza (60 g) de hummus.  8onzas (259ml) o 1taza de [place] chocolatada con bajo contenido de [place].  1tira de queso en hebras con 1naranja mediana.  1emparedado de [place] de man y [place] con [place] de pan integral y Insurance underwriter (5 g) de [place] de man. Para obtener 300caloras extra, podra comer dos de esas opciones saludables por da. Cul es la cantidad saludable de peso que se debe aumentar? La cantidad [place] para aumentar de peso se basa en su ndice de masa corporal ([place]) antes del embarazo. Si su IMC:  Era menos de 18 (baja de peso), debe aumentar de 28 a 40 libras (13 a  18kg).  Era de 44 a 24,9 (normal), debe aumentar de 25 a 35libras (11 a 16kg).  Era de 25 a 29,9 (sobrepeso), debe aumentar de 15 a 25libras (7 a 11kg).  Era de 30 o ms (obesa), debe aumentar de 11 a 20libras (5 a 9kg). Qu sucede si estoy embarazada de gemelos o ms bebs? Por lo general, si est embarazada de gemelos o ms bebs:  Es posible que deba ingerir entre 300 y 53 caloras extra por Training and development officer.  El rango de aumento de peso total recomendado es de 25 a 54libras (11 a 25kg), en funcin del [place] antes del embarazo.  Hable con el mdico para averiguar [employer] necesidades nutricionales, aumento de [place] y ejercicios adecuados para usted. Qu alimentos puedo comer?  Frutas Todas las frutas. Consuma frutas de colores y tipos variados. Recuerde lavar bien las frutas antes de pelarlas o comerlas. [place] verduras. Consuma verduras de colores y tipos variados. Recuerde lavar bien las verduras antes de pelarlas o comerlas. Granos United Stationers. Elija cereales integrales, como el pan integral, la avena o el arroz integral. [place] y otros alimentos ricos en protenas Carnes magras, entre ellas, pollo, [place], pescado y cortes magros de carne de [place], [place] o cerdo. Si come pescado o mariscos, elija opciones que tengan un alto contenido de cidos grasos omega-3 y bajo contenido de mercurio, como el salmn, el arenque, los [place], la [place], las sardinas, el abadejo, el [place], [place] cangrejo y Holiday representative. Tofu. [place]. Frijoles. Huevos. Interior de man y Programmer, multimedia de frutos secos. Asegrese de que todas las carnes, aves y huevos estn cocidas a temperaturas seguras para los alimentos o "bien cocidas". Se recomiendan dos o ms porciones de pescado por semana para obtener la mayor cantidad de beneficios de los cidos grasos omega-3 que se Medical sales representative. Elija pescados con bajo contenido de mercurio. Puede obtener ms informacin en  lnea:  [email or web address] Lcteos Leche y [place] (como la [place] de [place]). Yogur y queso pasteurizados. Requesn. Phoebe Sharps. Refrescos [place]. Jugos que contengan jugo de frutas o de verduras al 100%. Ts sin cafena y caf descafeinado. Se pueden tomar bebidas que contengan cafena, pero es mejor evitar esta sustancia. Mantenga la ingesta total de cafena en menos de 200mg  diarios (que equivale a 12onzas o 347ml de caf, t o refresco) o al [employer] Corporation le haya indicado el mdico. Grasas y aceites Las grasas y Microbiologist se pueden incluir de forma moderada. [place] y  postres Los dulces y postres se pueden incluir de forma moderada. Condimentos y otros [employer] condimentos pasteurizados. Es posible que los productos que se enumeran ms [place] no constituyan una lista completa de los alimentos y las bebidas que puede tomar. Consulte a un nutricionista para obtener ms informacin. Qu alimentos no se recomiendan? Frutas Jugos de frutas no pasteurizados. Verduras Jugos de verduras crudas (no pasteurizadas). Carnes y otros alimentos ricos en protenas Embutidos, salchichn, hot dogs u otros fiambres. (Si debe consumir esas carnes, vuelva a calentarlas hasta que emanen humo caliente). Pats refrigerados, pats de carne de una carnicera, mariscos ahumados que se encuentran en la seccin de productos refrigerados de [place] tienda. Carnes, aves y huevos crudos o mal cocidos. Pescado crudo, como sushi o sashimi. Pescados que tengan alto contenido de mercurio, como [place], tiburn, pez espada y caballa. Para obtener ms informacin sobre la presencia del mercurio en el pescado, consulte al mdico o busque recursos en lnea, como:  [email or web address] Lcteos Leche cruda (no pasteurizada) y cualquier alimento que contenga [place] cruda. Quesos blandos, como feta, queso [place], [place] fresco, [place], quesos [place], quesos azules y Stage manager panela (salvo que estn elaborados con  leche pasteurizada, lo cual debe constar en la etiqueta). Refrescos Alcohol. Bebidas endulzadas con azcar, como refrescos, ts o bebidas energizantes. Condimentos y otros [place] y bebidas caseros, como pickles, chucrut o bebidas con kombucha. (Se pueden consumir las versiones que estn pasteurizadas y se vendan en las tiendas). Ensaladas preparadas en una tienda o charcutera, como ensalada de [place], de [place], de [place], de atn y de Occupational hygienist. Es posible que los productos que se enumeran ms [place] no constituyan una lista completa de los alimentos y las bebidas que Nurse, adult. Consulte a un nutricionista para obtener ms informacin. Dnde buscar ms informacin Para calcular la cantidad de caloras que necesita segn su altura, peso y [place] de [place], [place] utilizar una calculadora en lnea, como:  [email or web address] Para calcular cunto debe aumentar de peso durante el [place], puede utilizar una calculadora de aumento de peso durante el embarazo en lnea, como:  [email or web address] Resumen  Durante el [place], [place] cuerpo requiere nutricin adicional para brindar sustento al beb que est creciendo.  Consuma una variedad de alimentos, en especial frutas y verduras, para obtener una gama completa de vitaminas y minerales.  Cuide la salubridad y la higiene en relacin con los alimentos. Lvese las manos antes de comer y despus de preparar carne cruda. Lave bien todas las frutas y verduras antes de pelarlas o comerlas. Tomar estas medidas puede ayudar a [employer] de origen alimentario, como la listeriosis, que pueden ser muy peligrosas para el beb.  No consuma carne ni pescado crudos. No consuma pescados que tengan alto contenido de mercurio, como [place], tiburn, pez espada y caballa. No consuma productos lcteos no pasteurizados (crudos).  Tome una vitamina prenatal para ayudar a Engineer, water las necesidades  adicionales de vitaminas y Health and safety inspector, especficamente de cido flico, hierro, calcio y vitamina D. Esta informacin no tiene Marine scientist el consejo del mdico. Asegrese de hacerle al mdico cualquier pregunta que tenga. Document Revised: 09/20/2018 Document Reviewed: 09/20/2018 Elsevier Patient Education  [place].  Hyperemesis Gravidarum Hyperemesis gravidarum is a severe form of nausea and vomiting that happens during pregnancy. Hyperemesis is worse than morning sickness. It may cause you to have nausea or vomiting all day for many days. It may keep you from eating and drinking enough food and liquids, which can  lead to dehydration, malnutrition, and weight loss. Hyperemesis usually occurs during the first half (the first 20 weeks) of pregnancy. It often goes away once a woman is in her second half of pregnancy. However, sometimes hyperemesis continues through an entire pregnancy. What are the causes? The cause of this condition is not known. It may be related to changes in chemicals (hormones) in the body during pregnancy, such as the high level of pregnancy hormone (human chorionic gonadotropin) or the increase in the female sex hormone (estrogen). What are the signs or symptoms? Symptoms of this condition include:  Nausea that does not go away.  Vomiting that does not allow you to keep any food down.  Weight loss.  Body fluid loss (dehydration).  Having no desire to eat, or not liking food that you have previously enjoyed. How is this diagnosed? This condition may be diagnosed based on:  A physical exam.  Your medical history.  Your symptoms.  Blood tests.  Urine tests. How is this treated? This condition is managed by controlling symptoms. This may include:  Following an eating plan. This can help lessen nausea and vomiting.  Taking prescription medicines. An eating plan and medicines are often used together to help control  symptoms. If medicines do not help relieve nausea and vomiting, you may need to receive fluids through an IV at the hospital. Follow these instructions at home: Eating and drinking   Avoid the following: ? Drinking fluids with meals. Try not to drink anything during the 30 minutes before and after your meals. ? Drinking more than 1 cup of fluid at a time. ? Eating foods that trigger your symptoms. These may include spicy foods, coffee, high-fat foods, very sweet foods, and acidic foods. ? Skipping meals. Nausea can be more intense on an empty stomach. If you cannot tolerate food, do not force it. Try sucking on ice chips or other frozen items and make up for missed calories later. ? Lying down within 2 hours after eating. ? Being exposed to environmental triggers. These may include food smells, smoky rooms, closed spaces, rooms with strong smells, warm or humid places, overly loud and noisy rooms, and rooms with motion or flickering lights. Try eating meals in a well-ventilated area that is free of strong smells. ? Quick and sudden changes in your movement. ? Taking iron pills and multivitamins that contain iron. If you take prescription iron pills, do not stop taking them unless your health care provider approves. ? Preparing food. The smell of food can spoil your appetite or trigger nausea.  To help relieve your symptoms: ? Listen to your body. Everyone is different and has different preferences. Find what works best for you. ? Eat and drink slowly. ? Eat 5-6 small meals daily instead of 3 large meals. Eating small meals and snacks can help you avoid an empty stomach. ? In the morning, before getting out of bed, eat a couple of crackers to avoid moving around on an empty stomach. ? Try eating starchy foods as these are usually tolerated well. Examples include cereal, toast, bread, potatoes, pasta, rice, and pretzels. ? Include at least 1 serving of protein with your meals and snacks. Protein  options include lean meats, poultry, seafood, beans, nuts, nut butters, eggs, cheese, and yogurt. ? Try eating a protein-rich snack before bed. Examples of a protein-rick snack include cheese and crackers or a peanut butter sandwich made with 1 slice of whole-wheat bread and 1 tsp (5 g) of peanut butter. ?  Eat or suck on things that have ginger in them. It may help relieve nausea. Add  tsp ground ginger to hot tea or choose ginger tea. ? Try drinking 100% fruit juice or an electrolyte drink. An electrolyte drink contains sodium, potassium, and chloride. ? Drink fluids that are cold, clear, and carbonated or sour. Examples include lemonade, ginger ale, lemon-lime soda, ice water, and sparkling water. ? Brush your teeth or use a mouth rinse after meals. ? Talk with your health care provider about starting a supplement of vitamin B6. General instructions  Take over-the-counter and prescription medicines only as told by your health care provider.  Follow instructions from your health care provider about eating or drinking restrictions.  Continue to take your prenatal vitamins as told by your health care provider. If you are having trouble taking your prenatal vitamins, talk with your health care provider about different options.  Keep all follow-up and pre-birth (prenatal) visits as told by your health care provider. This is important. Contact a health care provider if:  You have pain in your abdomen.  You have a severe headache.  You have vision problems.  You are losing weight.  You feel weak or dizzy. Get help right away if:  You cannot drink fluids without vomiting.  You vomit blood.  You have constant nausea and vomiting.  You are very weak.  You faint.  You have a fever and your symptoms suddenly get worse. Summary  Hyperemesis gravidarum is a severe form of nausea and vomiting that happens during pregnancy.  Making some changes to your eating habits may help relieve  nausea and vomiting.  This condition may be managed with medicine.  If medicines do not help relieve nausea and vomiting, you may need to receive fluids through an IV at the hospital. This information is not intended to replace advice given to you by your health care provider. Make sure you discuss any questions you have with your health care provider. Document Revised: 04/04/2017 Document Reviewed: 11/12/2015 Elsevier Patient Education  [place].  Back Pain in Pregnancy Back pain during pregnancy is common. Back pain may be caused by several factors that are related to changes during your pregnancy. Follow these instructions at home: Managing pain, stiffness, and swelling      If directed, for sudden (acute) back pain, put ice on the painful area. ? Put ice in a plastic bag. ? Place a towel between your skin and the bag. ? Leave the ice on for 20 minutes, 2-3 times per day.  If directed, apply heat to the affected area before you exercise. Use the heat source that your health care provider recommends, such as a moist heat pack or a heating pad. ? Place a towel between your skin and the heat source. ? Leave the heat on for 20-30 minutes. ? Remove the heat if your skin turns bright red. This is especially important if you are unable to feel pain, heat, or cold. You may have a greater risk of getting burned.  If directed, massage the affected area. Activity  Exercise as told by your health care provider. Gentle exercise is the best way to prevent or manage back pain.  Listen to your body when lifting. If lifting hurts, ask for help or bend your knees. This uses your leg muscles instead of your back muscles.  Squat down when picking up something from the floor. Do not bend over.  Only use bed rest for short periods as told  by your health care provider. Bed rest should only be used for the most severe episodes of back pain. Standing, sitting, and lying down  Do not stand  in one place for long periods of time.  Use good posture when sitting. Make sure your head rests over your shoulders and is not hanging forward. Use a pillow on your lower back if necessary.  Try sleeping on your side, preferably the left side, with a pregnancy support pillow or 1-2 regular pillows between your legs. ? If you have back pain after a night's rest, your bed may be too soft. ? A firm mattress may provide more support for your back during pregnancy. General instructions  Do not wear high heels.  Eat a healthy diet. Try to gain weight within your health care provider's recommendations.  Use a maternity girdle, elastic sling, or back brace as told by your health care provider.  Take over-the-counter and prescription medicines only as told by your health care provider.  Work with a physical therapist or massage therapist to find ways to manage back pain. Acupuncture or massage therapy may be helpful.  Keep all follow-up visits as told by your health care provider. This is important. Contact a health care provider if:  Your back pain interferes with your daily activities.  You have increasing pain in other parts of your body. Get help right away if:  You develop numbness, tingling, weakness, or problems with the use of your arms or legs.  You develop severe back pain that is not controlled with medicine.  You have a change in bowel or bladder control.  You develop shortness of breath, dizziness, or you faint.  You develop nausea, vomiting, or sweating.  You have back pain that is a rhythmic, cramping pain similar to labor pains. Labor pain is usually 1-2 minutes apart, lasts for about 1 minute, and involves a bearing down feeling or pressure in your pelvis.  You have back pain and your water breaks or you have vaginal bleeding.  You have back pain or numbness that travels down your leg.  Your back pain developed after you fell.  You develop pain on one side of  your back.  You see blood in your urine.  You develop skin blisters in the area of your back pain. Summary  Back pain may be caused by several factors that are related to changes during your pregnancy.  Follow instructions as told by your health care provider for managing pain, stiffness, and swelling.  Exercise as told by your health care provider. Gentle exercise is the best way to prevent or manage back pain.  Take over-the-counter and prescription medicines only as told by your health care provider.  Keep all follow-up visits as told by your health care provider. This is important. This information is not intended to replace advice given to you by your health care provider. Make sure you discuss any questions you have with your health care provider. Document Revised: 07/04/2018 Document Reviewed: 08/31/2017 Elsevier Patient Education  [place].  Tests and Screening During Pregnancy Having certain tests and screenings during pregnancy is an important part of your prenatal care. These tests help your health care provider find problems that might affect your pregnancy. Some tests are done for all pregnant women, and some are optional. Most of the tests and screenings do not pose any risks for you or your baby. You may need additional testing if any routine tests indicate a problem. Tests and screenings  done in early pregnancy Some tests and screenings you can expect to have in early pregnancy include:  Blood tests, such as: ? Complete blood count (CBC). This test is done to check your red and white blood cells. It can help identify a risk for anemia, infection, or bleeding. ? Blood typing. This test determines your blood type as well as whether you have a certain protein in your red blood cells (Rh factor). If you do not have this protein (Rh negative) and your baby does have it (Rh positive), your body could make antibodies to the Rh factor. This could be dangerous to your  baby's health. ? Tests to check for diseases that can cause birth defects or can be passed to your baby, such as:  [place] measles (rubella). The test indicates whether you are immune to rubella.  Hepatitis B and C. All women are tested for hepatitis B. You may also be tested for hepatitis C if you have risk factors for the condition.  Zika virus infection. You may have a blood or urine test to check for this infection if you or your partner has traveled to an area where the virus occurs.  Urine testing. A urine sample can be tested for diabetes, protein in your urine, and signs of infection.  Testing for sexually transmitted infections (STIs), such as HIV, syphilis, and chlamydia.  Testing for tuberculosis. You may have this skin test if you are at risk for tuberculosis.  Fetal ultrasound. This is an imaging study of your developing baby. It is done using sound waves and a computer. This test may be done at 11-14 weeks to confirm your pregnancy and help determine your due date. Tests and screenings done later in pregnancy Certain tests are done for the first time during later pregnancy. In addition, some of the tests that were done in early pregnancy are repeated at this time. Some common tests you can expect to have later in pregnancy include:  Rh antibody testing. If you are Rh negative, you will have a blood test at about 28 weeks of pregnancy to see if you are producing Rh antibodies. If you have not started to make antibodies, you will be given an injection to prevent you from making antibodies for the rest of your pregnancy.  Glucose screening. This tests your blood sugar to find out whether you are developing the type of diabetes that occurs during pregnancy (gestational diabetes). You may have this screening earlier if you have risk factors for diabetes.  Screening for group B streptococcus (GBS). GBS is a type of bacteria that may live in your rectum or vagina. You may have GBS  without any symptoms. GBS can spread to your baby during birth. This test involves doing a rectal and vaginal swab at 35-37 weeks of pregnancy. If testing is positive for GBS, you may be treated with antibiotic medicine.  CBC to check for anemia and blood-clotting ability.  Urine tests to check for protein, which can be a sign of a condition called preeclampsia.  Fetal ultrasound. This may be repeated at 16-20 weeks to check how your baby is growing and developing. Screening for birth defects Some birth defects are caused by abnormal genes passed down through families. Early in your pregnancy, tests can be done to find out if your baby is at risk for a genetic disorder. This testing is optional. The type of testing recommended for you will depend on your family and medical history, your ethnicity, and your age.  Testing may include:  Screening tests. These tests may include an ultrasound, blood tests, or a combination of both. The blood tests are used to check for abnormal genes, and the ultrasound is done to look for early birth defects.  Carrier screening. This test involves checking the blood or saliva of both parents to see if they carry abnormal genes that could be passed down to a baby. If genetic screening shows that your baby is at risk for a genetic defect, additional diagnostic testing may be recommended, such as:  Amniocentesis. This involves testing a sample of fluid from your womb (amniotic fluid).  Chorionic villus sampling. In this test, a sample of cells from your placenta is checked for abnormal cells. Unlike other tests done during pregnancy, diagnostic testing does have some risk for your pregnancy. Talk to your health care provider about the risks and benefits of genetic testing. Where to find more information  American Pregnancy Association: americanpregnancy.org/prenatal-testing  Office on Women's Health: KeywordPortfolios.com.br  March of Dimes:  marchofdimes.org/pregnancy Questions to ask your health care provider  What routine tests are recommended for me?  When and how will these tests be done?  When will I get the results of routine tests?  What do the results of these tests mean for me or my baby?  Do you recommend any genetic screening tests? Which ones?  Should I see a genetic counselor before having genetic screening? Summary  Having tests and screenings during pregnancy is an important part of your prenatal care.  In early pregnancy, testing may be done to check blood type, Rh status, and risks for various conditions that can affect your baby.  Fetal ultrasound may be done in early pregnancy to confirm a pregnancy and later to look for any birth defects.  Later in pregnancy, tests may include screening for GBS and gestational diabetes.  Genetic testing is optional. Consider talking to a genetic counselor about this testing. This information is not intended to replace advice given to you by your health care provider. Make sure you discuss any questions you have with your health care provider. Document Revised: 07/05/2018 Document Reviewed: 05/30/2017 Elsevier Patient Education  [place].  Activity Restriction During Pregnancy Your health care provider may recommend specific activity restrictions during pregnancy for a variety of reasons. Activity restriction may require that you limit activities that require great effort, such as exercise, lifting, or sex. The type of activity restriction will vary for each person, depending on your risk or the problems you are having. Activity restriction may be recommended for a period of time until your baby is delivered. Why are activity restrictions recommended? Activity restriction may be recommended if:  Your placenta is partially or completely covering the opening of your cervix (placenta previa).  There is bleeding between the wall of the uterus and the amniotic  sac in the first trimester of pregnancy (subchorionic hemorrhage).  You went into labor too early (preterm labor).  You have a history of miscarriage.  You have a condition that causes high blood pressure during pregnancy (preeclampsia or eclampsia).  You are pregnant with more than one baby.  Your baby is not growing well. What are the risks? The risks depend on your specific restriction. Strict bed rest has the most physical and emotional risks and is no longer routinely recommended. Risks of strict bed rest include:  Loss of muscle conditioning from not moving.  Blood clots.  Social isolation.  Depression.  Loss of income. Talk with your  health care team about activity restriction to decide if it is best for you and your baby. Even if you are having problems during your pregnancy, you may be able to continue with normal levels of activity with careful monitoring by your health care team. Follow these instructions at home: If needed, based on your overall health and the health of your baby, your health care provider will decide which type of activity restriction is right for you. Activity restrictions may include:  Not lifting anything heavier than 10 pounds (4.5 kg).  Avoiding activities that take a lot of physical effort.  No lifting or straining.  Resting in a sitting position or lying down for periods of time during the day. Pelvic rest may be recommended along with activity restrictions. If pelvic rest is recommended, then:  Do not have sex, an orgasm, or use sexual stimulation.  Do not use tampons. Do not douche. Do not put anything into your vagina.  Do not lift anything that is heavier than 10 lb (4.5 kg).  Avoid activities that require a lot of effort.  Avoid any activity in which your pelvic muscles could become strained, such as squatting. Questions to ask your health care provider  Why is my activity being limited?  How will activity restrictions affect  my body?  Why is rest helpful for me and my baby?  What activities can I do?  When can I return to normal activities? When should I seek immediate medical care? Seek immediate medical care if you have:  Vaginal bleeding.  Vaginal discharge.  Cramping pain in your lower abdomen.  Regular contractions.  A low, dull backache. Summary  Your health care provider may recommend specific activity restrictions during pregnancy for a variety of reasons.  Activity restriction may require that you limit activities such as exercise, lifting, sex, or any other activity that requires great effort.  Discuss the risks and benefits of activity restriction with your health care team to decide if it is best for you and your baby.  Contact your health care provider right away if you think you are having contractions, or if you notice vaginal bleeding, discharge, or cramping. This information is not intended to replace advice given to you by your health care provider. Make sure you discuss any questions you have with your health care provider. Document Revised: 12/06/2018 Document Reviewed: 07/05/2017 Elsevier Patient Education  [place].  Heartburn During Pregnancy Heartburn is a type of pain or discomfort that can happen in the throat or chest. It is often described as a burning sensation. Heartburn is common during pregnancy because:  A hormone (progesterone) that is released during pregnancy may relax the valve (lower esophageal sphincter, or LES) that separates the esophagus from the stomach. This allows stomach acid to move up into the esophagus, causing heartburn.  The uterus gets larger and pushes up on the stomach, which pushes more acid into the esophagus. This is especially true in the later stages of pregnancy. Heartburn usually goes away or gets better after giving birth. What are the causes? Heartburn is caused by stomach acid backing up into the esophagus (reflux). Reflux  can be triggered by:  Changing hormone levels.  Large meals.  Certain foods and beverages, such as coffee, chocolate, onions, and peppermint.  Exercise.  Increased stomach acid production. What increases the risk? You are more likely to experience heartburn during pregnancy if you:  Had heartburn prior to becoming pregnant.  Have been pregnant more than once  before.  Are overweight or obese. The likelihood that you will get heartburn also increases as you get farther along in your pregnancy, especially during the last trimester. What are the signs or symptoms? Symptoms of this condition include:  Burning pain in the chest or lower throat.  Bitter taste in the mouth.  Coughing.  Problems swallowing.  Vomiting.  Hoarse voice.  Asthma. Symptoms may get worse when you lie down or bend over. Symptoms are often worse at night. How is this diagnosed? This condition is diagnosed based on:  Your medical history.  Your symptoms.  Blood tests to check for a certain type of bacteria associated with heartburn.  Whether taking heartburn medicine relieves your symptoms.  Examination of the stomach and esophagus using a tube with a light and camera on the end (endoscopy). How is this treated? Treatment varies depending on how severe your symptoms are. Your health care provider may recommend:  Over-the-counter medicines (antacids or acid reducers) for mild heartburn.  Prescription medicines to decrease stomach acid or to protect your stomach lining.  Certain changes in your diet.  Raising the head of your bed so it is higher than the foot of the bed. This helps prevent stomach acid from backing up into the esophagus when you are lying down. Follow these instructions at home: Eating and drinking  Do not drink alcohol during your pregnancy.  Identify foods and beverages that make your symptoms worse, and avoid them.  Beverages that you may want to avoid  include: ? Coffee and tea (with or without caffeine). ? Energy drinks and sports drinks. ? Carbonated drinks or sodas. ? Citrus fruit juices.  Foods that you may want to avoid include: ? Chocolate and cocoa. ? Peppermint and mint flavorings. ? Garlic, onions, and horseradish. ? Spicy and acidic foods, including peppers, chili powder, curry powder, vinegar, hot sauces, and barbecue sauce. ? Citrus fruits, such as oranges, lemons, and limes. ? Tomato-based foods, such as red sauce, chili, and salsa. ? Fried and fatty foods, such as donuts, french fries, potato chips, and high-fat dressings. ? High-fat meats, such as hot dogs, cold cuts, sausage, ham, and bacon. ? High-fat dairy items, such as whole milk, butter, and cheese.  Eat small, frequent meals instead of large meals.  Avoid drinking large amounts of liquid with your meals.  Avoid eating meals during the 2-3 hours before bedtime.  Avoid lying down right after you eat.  Do not exercise right after you eat. Medicines  Take over-the-counter and prescription medicines only as told by your health care provider.  Do not take aspirin, ibuprofen, or other NSAIDs unless your health care provider tells you to do that.  You may be instructed to avoid medicines that contain sodium bicarbonate. General instructions   If directed, raise the head of your bed about 6 inches (15 cm) by putting blocks under the legs. Sleeping with more pillows does not effectively relieve heartburn because it only changes the position of your head.  Do not use any products that contain nicotine or tobacco, such as cigarettes and e-cigarettes. If you need help quitting, ask your health care provider.  Wear loose-fitting clothing.  Try to reduce your stress, such as with yoga or meditation. If you need help managing stress, ask your health care provider.  Maintain a healthy weight. If you are overweight, work with your health care provider to safely lose  weight.  Keep all follow-up visits as told by your health care provider. This  is important. Contact a health care provider if:  You develop new symptoms.  Your symptoms do not improve with treatment.  You have unexplained weight loss.  You have difficulty swallowing.  You make loud sounds when you breathe (wheeze).  You have a cough that does not go away.  You have frequent heartburn for more than 2 weeks.  You have nausea or vomiting that does not get better with treatment.  You have pain in your abdomen. Get help right away if:  You have severe chest pain that spreads to your arm, neck, or jaw.  You feel sweaty, dizzy, or light-headed.  You have shortness of breath.  You have pain when swallowing.  You vomit, and your vomit looks like blood or coffee grounds.  Your stool is bloody or black. This information is not intended to replace advice given to you by your health care provider. Make sure you discuss any questions you have with your health care provider. Document Revised: 06/13/2017 Document Reviewed: 12/01/2015 Elsevier Patient Education  [employer].

## 2020-01-08 NOTE — Progress Notes (Signed)
Subjective:    Emma Robbins is being seen today for her first obstetrical visit.  This is not a planned pregnancy. She is at [redacted]w[redacted]d gestation. Her obstetrical history is significant for none. Relationship with FOB: spouse, living together. Patient does intend to breast feed. Pregnancy history fully reviewed.  The information documented in the HPI was reviewed and verified.  Menstrual History: OB History    Gravida  1   Para      Term      Preterm      AB      Living  0     SAB      TAB      Ectopic      Multiple      Live Births               Patient's last menstrual period was 08/13/2019.    Past Medical History:  Diagnosis Date  . Anxiety   . Depression   . Gallstone 11/2011  . Headache(784.0)    unspecified - 2-3 x/week  . Hemorrhoids   . Thyroid disease     Past Surgical History:  Procedure Laterality Date  . BREAST SURGERY  2012   Fibroadenoma  . CHOLECYSTECTOMY  12/09/2011   Procedure: LAPAROSCOPIC CHOLECYSTECTOMY;  Surgeon: Haywood Lasso, MD;  Location: [facility];  Service: General;  Laterality: N/A;  . LAPAROSCOPIC OVARIAN CYSTECTOMY  12/16/10   Right cystadenofibroma    (Not in a hospital admission)  No Known Allergies  Social History   Tobacco Use  . Smoking status: Never Smoker  . Smokeless tobacco: Never Used  Substance Use Topics  . Alcohol use: Not Currently    Alcohol/week: 3.0 standard drinks    Types: 3 Standard drinks or equivalent per week    Comment: Rare    Family History  Problem Relation Age of Onset  . Hyperlipidemia Father   . Thyroid disease Father   . Diabetes Father   . Thyroid disease Brother   . Thyroid disease Sister   . Hypertension Maternal Grandmother   . Diabetes Paternal Grandmother   . Hyperlipidemia Paternal Grandmother   . Cancer Paternal Grandfather        Prostate     Review of Systems Constitutional: negative for weight loss Gastrointestinal: negative for  vomiting Genitourinary:negative for genital lesions and vaginal discharge and dysuria Musculoskeletal:negative for back pain Behavioral/Psych: negative for abusive relationship, depression, illegal drug usage and tobacco use    Objective:    BP 108/76   Pulse 81   Wt 199 lb 12.8 oz (90.6 kg)   LMP 08/13/2019   BMI 34.30 kg/m  General Appearance:    Alert, cooperative, no distress, appears stated age  Head:    Normocephalic, without obvious abnormality, atraumatic  Eyes:    PERRL, conjunctiva/corneas clear, EOM's intact, fundi    benign, both eyes  Ears:    Normal TM's and external ear canals, both ears  Nose:   Nares normal, septum midline, mucosa normal, no drainage    or sinus tenderness  Throat:   Lips, mucosa, and tongue normal; teeth and gums normal  Neck:   Supple, symmetrical, trachea midline, no adenopathy;    thyroid:  no enlargement/tenderness/nodules; no carotid   bruit or JVD  Back:     Symmetric, no curvature, ROM normal, no CVA tenderness  Lungs:     Clear to auscultation bilaterally, respirations unlabored  Chest Wall:    No tenderness or deformity  Heart:    Regular rate and rhythm, S1 and S2 normal, no murmur, rub   or gallop  Breast Exam:    No tenderness, masses, or nipple abnormality  Abdomen:     Soft, non-tender, bowel sounds active all four quadrants,    no masses, no organomegaly  Genitalia:    Normal female without lesion, discharge or tenderness  Extremities:   Extremities normal, atraumatic, no cyanosis or edema  Pulses:   2+ and symmetric all extremities  Skin:   Skin color, texture, turgor normal, no rashes or lesions  Lymph nodes:   Cervical, supraclavicular, and axillary nodes normal  Neurologic:   CNII-XII intact, normal strength, sensation and reflexes    throughout      Lab Review Urine pregnancy test Labs reviewed yes Radiologic studies reviewed no Assessment:    Pregnancy at [redacted]w[redacted]d weeks    Plan:     1. Supervision of normal first  pregnancy, antepartum Rx: - Cytology - PAP( [facility]) - Cervicovaginal ancillary only( [facility]) - Culture, OB Urine - CBC/D/Plt+RPR+Rh+ABO+Rub Ab... - Genetic Screening - AFP, Serum, Open Spina Bifida  2. Nausea and vomiting during pregnancy prior to [redacted] weeks gestation Rx: - Doxylamine-Pyridoxine (DICLEGIS) 10-10 MG TBEC; 1 tab in AM, 1 tab mid afternoon 2 tabs at bedtime. Max dose 4 tabs daily.  Dispense: 100 tablet; Refill: 5  3. Hypothyroidism affecting pregnancy, antepartum Rx: - levothyroxine (EUTHYROX) 25 MCG tablet; Take 1 tablet (25 mcg total) by mouth daily before breakfast.  Dispense: 30 tablet; Refill: 11  4. Backache symptom Rx: - Elastic Bandages & Supports (COMFORT FIT MATERNITY SUPP SM) MISC; Wear as directed.  Dispense: 1 each; Refill: 0   Prenatal vitamins.  Counseling provided regarding continued use of seat belts, cessation of alcohol consumption, smoking or use of illicit drugs; infection precautions i.e., influenza/TDAP immunizations, toxoplasmosis,CMV, parvovirus, listeria and varicella; workplace safety, exercise during pregnancy; routine dental care, safe medications, sexual activity, hot tubs, saunas, pools, travel, caffeine use, fish and methlymercury, potential toxins, hair treatments, varicose veins Weight gain recommendations per IOM guidelines reviewed: underweight/BMI< 18.5--> gain 28 - 40 lbs; normal weight/BMI 18.5 - 24.9--> gain 25 - 35 lbs; overweight/BMI 25 - 29.9--> gain 15 - 25 lbs; obese/BMI >30->gain  11 - 20 lbs Problem list reviewed and updated. FIRST/CF mutation testing/NIPT/QUAD SCREEN/fragile X/Ashkenazi Jewish population testing/Spinal muscular atrophy discussed: requested. Role of ultrasound in pregnancy discussed; fetal survey: requested. Amniocentesis discussed: not indicated.  Meds ordered this encounter  Medications  . Elastic Bandages & Supports (COMFORT FIT MATERNITY SUPP SM) MISC    Sig: Wear as directed.    Dispense:  1  each    Refill:  0  . Doxylamine-Pyridoxine (DICLEGIS) 10-10 MG TBEC    Sig: 1 tab in AM, 1 tab mid afternoon 2 tabs at bedtime. Max dose 4 tabs daily.    Dispense:  100 tablet    Refill:  5  . levothyroxine (EUTHYROX) 25 MCG tablet    Sig: Take 1 tablet (25 mcg total) by mouth daily before breakfast.    Dispense:  30 tablet    Refill:  11   Orders Placed This Encounter  Procedures  . Culture, OB Urine  . CBC/D/Plt+RPR+Rh+ABO+Rub Ab...  . Genetic Screening  . AFP, Serum, Open Spina Bifida    Order Specific Question:   Is patient insulin dependent?    Answer:   No    Order Specific Question:   Weight (lbs)    Answer:  199    Order Specific Question:   Gestational Age (GA), weeks    Answer:   58    Order Specific Question:   Date on which patient was at this [facility]    Answer:   01/08/2020    Order Specific Question:   GA Calculation Method    Answer:   LMP    Order Specific Question:   Number of fetuses    Answer:   1    Order Specific Question:   Donor egg?    Answer:   N    Follow up in 2 weeks. 50% of 20 min visit spent on counseling and coordination of care.   Shelly Bombard, MD 01/08/2020 10:48 PM

## 2020-01-08 NOTE — Progress Notes (Signed)
NOB in office, pt is married, unplanned pregnancy. Pt reports fetal movement with some pressure.

## 2020-01-09 ENCOUNTER — Encounter: Payer: BLUE CROSS/BLUE SHIELD | Admitting: Obstetrics and Gynecology

## 2020-01-09 ENCOUNTER — Encounter: Payer: BLUE CROSS/BLUE SHIELD | Admitting: Obstetrics

## 2020-01-09 ENCOUNTER — Other Ambulatory Visit: Payer: Self-pay | Admitting: Obstetrics

## 2020-01-09 DIAGNOSIS — N760 Acute vaginitis: Secondary | ICD-10-CM

## 2020-01-09 DIAGNOSIS — B373 Candidiasis of vulva and vagina: Secondary | ICD-10-CM

## 2020-01-09 DIAGNOSIS — B3731 Acute candidiasis of vulva and vagina: Secondary | ICD-10-CM

## 2020-01-09 LAB — CERVICOVAGINAL ANCILLARY ONLY
Bacterial Vaginitis (gardnerella): POSITIVE — AB
Candida Glabrata: NEGATIVE
Candida Vaginitis: POSITIVE — AB
Chlamydia: NEGATIVE
Comment: NEGATIVE
Comment: NEGATIVE
Comment: NEGATIVE
Comment: NEGATIVE
Comment: NEGATIVE
Comment: NORMAL
Neisseria Gonorrhea: NEGATIVE
Trichomonas: NEGATIVE

## 2020-01-09 LAB — CYTOLOGY - PAP
Comment: NEGATIVE
Diagnosis: NEGATIVE
High risk HPV: NEGATIVE

## 2020-01-09 MED ORDER — TERCONAZOLE 0.4 % VA CREA: 1.0000 | TOPICAL_CREAM | Freq: Every day | VAGINAL | 0 refills | Status: DC

## 2020-01-09 MED ORDER — METRONIDAZOLE 500 MG PO TABS: 500.0000 mg | ORAL_TABLET | Freq: Two times a day (BID) | ORAL | 2 refills | Status: DC

## 2020-01-10 LAB — CBC/D/PLT+RPR+RH+ABO+RUB AB...
Antibody Screen: NEGATIVE
Basophils Absolute: 0 10*3/uL (ref 0.0–0.2)
Basos: 0 %
EOS (ABSOLUTE): 0.1 10*3/uL (ref 0.0–0.4)
Eos: 1 %
HCV Ab: <0.1 s/co ratio (ref 0.0–0.9)
HIV Screen 4th Generation wRfx: NONREACTIVE
Hematocrit: 35.7 % (ref 34.0–46.6)
Hemoglobin: 12.3 g/dL (ref 11.1–15.9)
Hepatitis B Surface Ag: NEGATIVE
Immature Grans (Abs): 0 10*3/uL (ref 0.0–0.1)
Immature Granulocytes: 0 %
Lymphocytes Absolute: 2.3 10*3/uL (ref 0.7–3.1)
Lymphs: 21 %
MCH: 32.6 pg (ref 26.6–33.0)
MCHC: 34.5 g/dL (ref 31.5–35.7)
MCV: 95 fL (ref 79–97)
Monocytes Absolute: 0.6 10*3/uL (ref 0.1–0.9)
Monocytes: 5 %
Neutrophils Absolute: 7.9 10*3/uL — ABNORMAL HIGH (ref 1.4–7.0)
Neutrophils: 73 %
Platelets: 276 10*3/uL (ref 150–450)
RBC: 3.77 x10E6/uL (ref 3.77–5.28)
RDW: 12.8 % (ref 11.7–15.4)
RPR Ser Ql: NONREACTIVE
Rh Factor: POSITIVE
Rubella Antibodies, IGG: 1.11 index (ref >0.99)
WBC: 10.9 10*3/uL — ABNORMAL HIGH (ref 3.4–10.8)

## 2020-01-10 LAB — AFP, SERUM, OPEN SPINA BIFIDA
AFP MoM: 1.47
AFP Value: 80.3 ng/mL
Gest. Age on Collection Date: 21 weeks
Maternal Age At EDD: 29.3 yr
OSBR Risk 1 IN: 2965
Test Results:: NEGATIVE
Weight: 199 [lb_av]

## 2020-01-10 LAB — HCV INTERPRETATION

## 2020-01-12 LAB — CULTURE, OB URINE

## 2020-01-12 LAB — URINE CULTURE, OB REFLEX

## 2020-01-16 ENCOUNTER — Encounter: Payer: Self-pay | Admitting: Obstetrics

## 2020-01-18 ENCOUNTER — Encounter: Payer: Self-pay | Admitting: *Deleted

## 2020-01-18 ENCOUNTER — Ambulatory Visit: Payer: Medicaid Other | Attending: Obstetrics & Gynecology

## 2020-01-18 ENCOUNTER — Other Ambulatory Visit: Payer: Self-pay

## 2020-01-18 ENCOUNTER — Other Ambulatory Visit: Payer: Self-pay | Admitting: *Deleted

## 2020-01-18 ENCOUNTER — Encounter: Payer: Self-pay | Admitting: Obstetrics

## 2020-01-18 ENCOUNTER — Ambulatory Visit: Payer: Medicaid Other | Admitting: *Deleted

## 2020-01-18 VITALS — BP 102/59 | HR 89

## 2020-01-18 DIAGNOSIS — O0930 Supervision of pregnancy with insufficient antenatal care, unspecified trimester: Secondary | ICD-10-CM

## 2020-01-18 DIAGNOSIS — E039 Hypothyroidism, unspecified: Secondary | ICD-10-CM | POA: Diagnosis not present

## 2020-01-18 DIAGNOSIS — Z3A22 22 weeks gestation of pregnancy: Secondary | ICD-10-CM

## 2020-01-18 DIAGNOSIS — O99282 Endocrine, nutritional and metabolic diseases complicating pregnancy, second trimester: Secondary | ICD-10-CM | POA: Diagnosis not present

## 2020-01-18 DIAGNOSIS — O0932 Supervision of pregnancy with insufficient antenatal care, second trimester: Secondary | ICD-10-CM | POA: Diagnosis not present

## 2020-01-18 DIAGNOSIS — Z3400 Encounter for supervision of normal first pregnancy, unspecified trimester: Secondary | ICD-10-CM | POA: Diagnosis present

## 2020-01-18 DIAGNOSIS — Z363 Encounter for antenatal screening for malformations: Secondary | ICD-10-CM | POA: Diagnosis not present

## 2020-01-18 DIAGNOSIS — Z362 Encounter for other antenatal screening follow-up: Secondary | ICD-10-CM

## 2020-01-21 ENCOUNTER — Encounter: Payer: Medicaid Other | Admitting: Advanced Practice Midwife

## 2020-01-28 ENCOUNTER — Encounter: Payer: Medicaid Other | Admitting: Advanced Practice Midwife

## 2020-01-30 ENCOUNTER — Other Ambulatory Visit: Payer: Self-pay

## 2020-01-30 ENCOUNTER — Other Ambulatory Visit: Payer: Medicaid Other

## 2020-01-30 ENCOUNTER — Ambulatory Visit (INDEPENDENT_AMBULATORY_CARE_PROVIDER_SITE_OTHER): Payer: Medicaid Other | Admitting: Advanced Practice Midwife

## 2020-01-30 VITALS — BP 110/73 | HR 85 | Wt 204.0 lb

## 2020-01-30 DIAGNOSIS — E039 Hypothyroidism, unspecified: Secondary | ICD-10-CM

## 2020-01-30 DIAGNOSIS — Z3A25 25 weeks gestation of pregnancy: Secondary | ICD-10-CM

## 2020-01-30 DIAGNOSIS — O0990 Supervision of high risk pregnancy, unspecified, unspecified trimester: Secondary | ICD-10-CM

## 2020-01-30 NOTE — Progress Notes (Signed)
   PRENATAL VISIT NOTE  Subjective:  Emma Robbins is a 29 y.o. G1P0 at [redacted]w[redacted]d being seen today for ongoing prenatal care.  She is currently monitored for the following issues for this low-risk pregnancy and has OBESITY; BULIMIA NERVOSA; ALLERGIC CONJUNCTIVITIS; Acquired hypothyroidism; Adjustment disorder with mixed anxiety and depressed mood; and Supervision of high risk pregnancy, antepartum on their problem list.  Patient reports no complaints.  Contractions: Not present. Vag. Bleeding: None.  Movement: Present. Denies leaking of fluid.   The following portions of the patient's history were reviewed and updated as appropriate: allergies, current medications, past family history, past medical history, past social history, past surgical history and problem list.   Objective:   Vitals:   01/30/20 0832  BP: 110/73  Pulse: 85  Weight: 204 lb (92.5 kg)    Fetal Status: Fetal Heart Rate (bpm): 150 Fundal Height: 25 cm Movement: Present     General:  Alert, oriented and cooperative. Patient is in no acute distress.  Skin: Skin is warm and dry. No rash noted.   Cardiovascular: Normal heart rate noted  Respiratory: Normal respiratory effort, no problems with respiration noted  Abdomen: Soft, gravid, appropriate for gestational age.  Pain/Pressure: Present     Pelvic: Cervical exam deferred        Extremities: Normal range of motion.     Mental Status: Normal mood and affect. Normal behavior. Normal judgment and thought content.   Assessment and Plan:  Pregnancy: G1P0 at [redacted]w[redacted]d 1. Acquired hypothyroidism --On Synthroid, check TSH with 28 week labs  2. Supervision of high risk pregnancy, antepartum --Anticipatory guidance about next visits/weeks of pregnancy given. --Next visit in 4 weeks in office for GTT  3. [redacted] weeks gestation of pregnancy   Preterm labor symptoms and general obstetric precautions including but not limited to vaginal bleeding, contractions, leaking of  fluid and fetal movement were reviewed in detail with the patient. Please refer to After Visit Summary for other counseling recommendations.   Return in about 4 weeks (around 02/27/2020).  Future Appointments  Date Time Provider [place]  02/18/2020  7:15 AM [facility] NURSE [facility]  02/18/2020  7:30 AM [facility] US3 [facility]  02/27/2020  8:30 AM Griffin Basil, MD [place] None    [name], CNM

## 2020-02-18 ENCOUNTER — Other Ambulatory Visit: Payer: Self-pay | Admitting: *Deleted

## 2020-02-18 ENCOUNTER — Ambulatory Visit: Payer: Medicaid Other | Admitting: *Deleted

## 2020-02-18 ENCOUNTER — Other Ambulatory Visit: Payer: Self-pay

## 2020-02-18 ENCOUNTER — Encounter: Payer: Self-pay | Admitting: *Deleted

## 2020-02-18 ENCOUNTER — Ambulatory Visit: Payer: Medicaid Other | Attending: Obstetrics and Gynecology

## 2020-02-18 DIAGNOSIS — O99212 Obesity complicating pregnancy, second trimester: Secondary | ICD-10-CM | POA: Diagnosis not present

## 2020-02-18 DIAGNOSIS — O0990 Supervision of high risk pregnancy, unspecified, unspecified trimester: Secondary | ICD-10-CM | POA: Diagnosis present

## 2020-02-18 DIAGNOSIS — Z3A27 27 weeks gestation of pregnancy: Secondary | ICD-10-CM

## 2020-02-18 DIAGNOSIS — O0932 Supervision of pregnancy with insufficient antenatal care, second trimester: Secondary | ICD-10-CM | POA: Diagnosis not present

## 2020-02-18 DIAGNOSIS — O99282 Endocrine, nutritional and metabolic diseases complicating pregnancy, second trimester: Secondary | ICD-10-CM | POA: Diagnosis not present

## 2020-02-18 DIAGNOSIS — Z362 Encounter for other antenatal screening follow-up: Secondary | ICD-10-CM | POA: Diagnosis present

## 2020-02-18 DIAGNOSIS — O43192 Other malformation of placenta, second trimester: Secondary | ICD-10-CM | POA: Diagnosis not present

## 2020-02-18 DIAGNOSIS — Z363 Encounter for antenatal screening for malformations: Secondary | ICD-10-CM

## 2020-02-18 DIAGNOSIS — E039 Hypothyroidism, unspecified: Secondary | ICD-10-CM

## 2020-02-18 DIAGNOSIS — O43199 Other malformation of placenta, unspecified trimester: Secondary | ICD-10-CM

## 2020-02-24 ENCOUNTER — Encounter (HOSPITAL_COMMUNITY): Payer: Self-pay | Admitting: Family Medicine

## 2020-02-24 ENCOUNTER — Inpatient Hospital Stay (HOSPITAL_COMMUNITY)
Admission: AD | Admit: 2020-02-24 | Discharge: 2020-02-24 | Disposition: A | Discharge status: Home / Self Care | Payer: Medicaid Other | Attending: Family Medicine | Admitting: Family Medicine

## 2020-02-24 DIAGNOSIS — O23593 Infection of other part of genital tract in pregnancy, third trimester: Secondary | ICD-10-CM | POA: Insufficient documentation

## 2020-02-24 DIAGNOSIS — R109 Unspecified abdominal pain: Secondary | ICD-10-CM | POA: Diagnosis present

## 2020-02-24 DIAGNOSIS — B9689 Other specified bacterial agents as the cause of diseases classified elsewhere: Secondary | ICD-10-CM | POA: Insufficient documentation

## 2020-02-24 DIAGNOSIS — N760 Acute vaginitis: Secondary | ICD-10-CM

## 2020-02-24 DIAGNOSIS — R102 Pelvic and perineal pain: Secondary | ICD-10-CM | POA: Insufficient documentation

## 2020-02-24 DIAGNOSIS — Z79890 Hormone replacement therapy (postmenopausal): Secondary | ICD-10-CM | POA: Diagnosis not present

## 2020-02-24 DIAGNOSIS — O26899 Other specified pregnancy related conditions, unspecified trimester: Secondary | ICD-10-CM

## 2020-02-24 DIAGNOSIS — O26893 Other specified pregnancy related conditions, third trimester: Secondary | ICD-10-CM | POA: Diagnosis not present

## 2020-02-24 DIAGNOSIS — Z3A28 28 weeks gestation of pregnancy: Secondary | ICD-10-CM | POA: Diagnosis not present

## 2020-02-24 LAB — WET PREP, GENITAL
Sperm: NONE SEEN
Trich, Wet Prep: NONE SEEN
Yeast Wet Prep HPF POC: NONE SEEN

## 2020-02-24 LAB — URINALYSIS, ROUTINE W REFLEX MICROSCOPIC
Bilirubin Urine: NEGATIVE
Glucose, UA: NEGATIVE mg/dL
Hgb urine dipstick: NEGATIVE
Ketones, ur: NEGATIVE mg/dL
Nitrite: NEGATIVE
Protein, ur: 30 mg/dL — AB
Specific Gravity, Urine: 1.024 (ref 1.005–1.030)
pH: 8 (ref 5.0–8.0)

## 2020-02-24 MED ORDER — METRONIDAZOLE 500 MG PO TABS: 500.0000 mg | ORAL_TABLET | Freq: Two times a day (BID) | ORAL | 0 refills | Status: DC

## 2020-02-24 MED ORDER — ACETAMINOPHEN 500 MG PO TABS
1000.0000 mg | ORAL_TABLET | Freq: Once | ORAL | Status: AC
  Administered 2020-02-24: 1000 mg via ORAL
  Filled 2020-02-24: qty 2

## 2020-02-24 NOTE — Discharge Instructions (Signed)
Bacterial Vaginosis  Bacterial vaginosis is a vaginal infection that occurs when the normal balance of bacteria in the vagina is disrupted. It results from an overgrowth of certain bacteria. This is the most common vaginal infection among women ages 20-44. Because bacterial vaginosis increases your risk for STIs (sexually transmitted infections), getting treated can help reduce your risk for chlamydia, gonorrhea, herpes, and HIV (human immunodeficiency virus). Treatment is also important for preventing complications in pregnant women, because this condition can cause an early (premature) delivery. What are the causes? This condition is caused by an increase in harmful bacteria that are normally present in small amounts in the vagina. However, the reason that the condition develops is not fully understood. What increases the risk? The following factors may make you more likely to develop this condition:  Having a new sexual partner or multiple sexual partners.  Having unprotected sex.  Douching.  Having an intrauterine device (IUD).  Smoking.  Drug and alcohol abuse.  Taking certain antibiotic medicines.  Being pregnant. You cannot get bacterial vaginosis from toilet seats, bedding, swimming pools, or contact with objects around you. What are the signs or symptoms? Symptoms of this condition include:  Grey or white vaginal discharge. The discharge can also be watery or foamy.  A fish-like odor with discharge, especially after sexual intercourse or during menstruation.  Itching in and around the vagina.  Burning or pain with urination. Some women with bacterial vaginosis have no signs or symptoms. How is this diagnosed? This condition is diagnosed based on:  Your medical history.  A physical exam of the vagina.  Testing a sample of vaginal fluid under a microscope to look for a large amount of bad bacteria or abnormal cells. Your health care provider may use a cotton swab  or a small wooden spatula to collect the sample. How is this treated? This condition is treated with antibiotics. These may be given as a pill, a vaginal cream, or a medicine that is put into the vagina (suppository). If the condition comes back after treatment, a second round of antibiotics may be needed. Follow these instructions at home: Medicines  Take over-the-counter and prescription medicines only as told by your health care provider.  Take or use your antibiotic as told by your health care provider. Do not stop taking or using the antibiotic even if you start to feel better. General instructions  If you have a female sexual partner, tell her that you have a vaginal infection. She should see her health care provider and be treated if she has symptoms. If you have a female sexual partner, he does not need treatment.  During treatment: ? Avoid sexual activity until you finish treatment. ? Do not douche. ? Avoid alcohol as directed by your health care provider. ? Avoid breastfeeding as directed by your health care provider.  Drink enough water and fluids to keep your urine clear or pale yellow.  Keep the area around your vagina and rectum clean. ? Wash the area daily with warm water. ? Wipe yourself from front to back after using the toilet.  Keep all follow-up visits as told by your health care provider. This is important. How is this prevented?  Do not douche.  Wash the outside of your vagina with warm water only.  Use protection when having sex. This includes latex condoms and dental dams.  Limit how many sexual partners you have. To help prevent bacterial vaginosis, it is best to have sex with just one  partner (monogamous).  Make sure you and your sexual partner are tested for STIs.  Wear cotton or cotton-lined underwear.  Avoid wearing tight pants and pantyhose, especially during summer.  Limit the amount of alcohol that you drink.  Do not use any products that  contain nicotine or tobacco, such as cigarettes and e-cigarettes. If you need help quitting, ask your health care provider.  Do not use illegal drugs. Where to find more information  Centers for Disease Control and Prevention: AppraiserFraud.fi  American Sexual Health Association (ASHA): www.ashastd.org  U.S. Department of Health and Financial controller, Office on Women's Health: DustingSprays.pl or SecuritiesCard.it Contact a health care provider if:  Your symptoms do not improve, even after treatment.  You have more discharge or pain when urinating.  You have a fever.  You have pain in your abdomen.  You have pain during sex.  You have vaginal bleeding between periods. Summary  Bacterial vaginosis is a vaginal infection that occurs when the normal balance of bacteria in the vagina is disrupted.  Because bacterial vaginosis increases your risk for STIs (sexually transmitted infections), getting treated can help reduce your risk for chlamydia, gonorrhea, herpes, and HIV (human immunodeficiency virus). Treatment is also important for preventing complications in pregnant women, because the condition can cause an early (premature) delivery.  This condition is treated with antibiotic medicines. These may be given as a pill, a vaginal cream, or a medicine that is put into the vagina (suppository). This information is not intended to replace advice given to you by your health care provider. Make sure you discuss any questions you have with your health care provider. Document Revised: 02/25/2017 Document Reviewed: 11/29/2015 Elsevier Patient Education  [place]. Round Ligament Pain  The round ligament is a cord of muscle and tissue that helps support the uterus. It can become a source of pain during pregnancy if it becomes stretched or twisted as the baby grows. The pain usually begins in the second trimester (13-28 weeks) of pregnancy,  and it can come and go until the baby is delivered. It is not a serious problem, and it does not cause harm to the baby. Round ligament pain is usually a short, sharp, and pinching pain, but it can also be a dull, lingering, and aching pain. The pain is felt in the lower side of the abdomen or in the groin. It usually starts deep in the groin and moves up to the outside of the hip area. The pain may occur when you:  Suddenly change position, such as quickly going from a sitting to standing position.  Roll over in bed.  Cough or sneeze.  Do physical activity. Follow these instructions at home:   Watch your condition for any changes.  When the pain starts, relax. Then try any of these methods to help with the pain: ? Sitting down. ? Flexing your knees up to your abdomen. ? Lying on your side with one pillow under your abdomen and another pillow between your legs. ? Sitting in a warm bath for 15-20 minutes or until the pain goes away.  Take over-the-counter and prescription medicines only as told by your health care provider.  Move slowly when you sit down or stand up.  Avoid long walks if they cause pain.  Stop or reduce your physical activities if they cause pain.  Keep all follow-up visits as told by your health care provider. This is important. Contact a health care provider if:  Your pain does not  go away with treatment.  You feel pain in your back that you did not have before.  Your medicine is not helping. Get help right away if:  You have a fever or chills.  You develop uterine contractions.  You have vaginal bleeding.  You have nausea or vomiting.  You have diarrhea.  You have pain when you urinate. Summary  Round ligament pain is felt in the lower abdomen or groin. It is usually a short, sharp, and pinching pain. It can also be a dull, lingering, and aching pain.  This pain usually begins in the second trimester (13-28 weeks). It occurs because the uterus  is stretching with the growing baby, and it is not harmful to the baby.  You may notice the pain when you suddenly change position, when you cough or sneeze, or during physical activity.  Relaxing, flexing your knees to your abdomen, lying on one side, or taking a warm bath may help to get rid of the pain.  Get help from your health care provider if the pain does not go away or if you have vaginal bleeding, nausea, vomiting, diarrhea, or painful urination. This information is not intended to replace advice given to you by your health care provider. Make sure you discuss any questions you have with your health care provider. Document Revised: 08/31/2017 Document Reviewed: 08/31/2017 Elsevier Patient Education  [place].

## 2020-02-24 NOTE — MAU Note (Signed)
.   Emma Robbins is a 29 y.o. at [redacted]w[redacted]d here in MAU reporting: she has lower back pain 3/10 when lying and a 10/10 when she is up walking. Lower abdominal pain when she walks 10/10 while lying 1/10. Reports yellow discharge and she has been treated for that. +FM  Onset of complaint: Friday 11/26  Vitals:   02/24/20 1212  BP: 122/64  Pulse: 82  Resp: 18  Temp: 98.2 F (36.8 C)  SpO2: 98%     FHT:145 Lab orders placed from triage: UA

## 2020-02-24 NOTE — MAU Provider Note (Signed)
History     CSN: [id number]  Arrival date and time: 02/24/20 1152   None     Chief Complaint  Patient presents with   Back Pain   Abdominal Pain   Emma Robbins is a 29 y.o. G1P0 at 22w6dhere with reports of lower abdominal and back pain since Friday. She reports that pain is a constant, sharp shooting pain, worse with walking, and changing positions in bed. She took Benadryl Friday evening to help her rest, which helped some, but pain has continued. She has not taken Tylenol or tried any other relief measures. Was recently fitted for maternity support belt, however has not used it. She is also reporting a thick, yellow discharge, without itching, irritation, or odor. Was recently treated for yeast, however the discharge has come back. She denies contractions, vaginal bleeding, or LOF. Endorses good fetal movement.   Back Pain Associated symptoms include abdominal pain.  Abdominal Pain    OB History    Gravida  1   Para      Term      Preterm      AB      Living  0     SAB      TAB      Ectopic      Multiple      Live Births              Past Medical History:  Diagnosis Date   Anxiety    Depression    Gallstone 11/2011   Headache(784.0)    unspecified - 2-3 x/week   Hemorrhoids    Thyroid disease     Past Surgical History:  Procedure Laterality Date   BREAST SURGERY  2012   Fibroadenoma   CHOLECYSTECTOMY  12/09/2011   Procedure: LAPAROSCOPIC CHOLECYSTECTOMY;  Surgeon: CHaywood Lasso MD;  [facility]  Service: General;  Laterality: N/A;   LAPAROSCOPIC OVARIAN CYSTECTOMY  12/16/10   Right cystadenofibroma    Family History  Problem Relation Age of Onset   Hyperlipidemia Father    Thyroid disease Father    Diabetes Father    Thyroid disease Brother    Thyroid disease Sister    Hypertension Maternal Grandmother    Diabetes Paternal Grandmother    Hyperlipidemia Paternal  Grandmother    Cancer Paternal Grandfather        Prostate    Social History   Tobacco Use   Smoking status: Never Smoker   Smokeless tobacco: Never Used  Vaping Use   Vaping Use: Never used  Substance Use Topics   Alcohol use: Not Currently    Alcohol/week: 3.0 standard drinks    Types: 3 Standard drinks or equivalent per week    Comment: Rare   Drug use: Not Currently    Types: Cocaine    Comment: per pt did Cocaine 2 yrs ago.    Allergies: No Known Allergies  Medications Prior to Admission  Medication Sig Dispense Refill Last Dose   Blood Pressure Monitoring (BLOOD PRESSURE KIT) DEVI 1 kit by Does not apply route as needed. 1 each 0    Doxylamine-Pyridoxine (DICLEGIS) 10-10 MG TBEC 1 tab in AM, 1 tab mid afternoon 2 tabs at bedtime. Max dose 4 tabs daily. (Patient not taking: Reported on 01/18/2020) 100 tablet 5    Elastic Bandages & Supports (COMFORT FIT MATERNITY SUPP SM) MISC Wear as directed. 1 each 0    levothyroxine (EUTHYROX) 25 MCG tablet Take 1 tablet (25  mcg total) by mouth daily before breakfast. 30 tablet 11    Prenatal Vit-Fe Fumarate-FA (MULTIVITAMIN-PRENATAL) 27-0.8 MG TABS tablet Take 1 tablet by mouth daily at 12 noon. (Patient not taking: Reported on 01/18/2020)      terconazole (TERAZOL 7) 0.4 % vaginal cream Place 1 applicator vaginally at bedtime. (Patient not taking: Reported on 01/18/2020) 45 g 0    Review of Systems  Constitutional: Negative.   Respiratory: Negative.   Cardiovascular: Negative.   Gastrointestinal: Positive for abdominal pain.  Genitourinary: Positive for vaginal discharge.  Musculoskeletal: Positive for back pain.  Neurological: Negative.   Psychiatric/Behavioral: Negative.    Physical Exam   Last menstrual period 08/06/2019.  Physical Exam Cardiovascular:     Rate and Rhythm: Normal rate.  Pulmonary:     Effort: Pulmonary effort is normal.  Abdominal:     Palpations: Abdomen is soft.     Comments: Gravid    Genitourinary:    Vagina: Vaginal discharge (copious amount of thin, yellow discharge) present.     Comments: Cervix closed/thick Skin:    General: Skin is warm and dry.  Neurological:     General: No focal deficit present.     Mental Status: She is alert.  Psychiatric:        Mood and Affect: Mood normal.        Behavior: Behavior normal.   Fetal Heart Rate: 145, moderate variability, + accels, no decels  Toco: no contractions   MAU Course  Procedures  MDM UA- negative Wet Prep- clue cells present GC/CT- pending Cervix closed/thick Tylenol 104m PO, pain improved after Tylenol PO hydration NST- reactive for gestational age, no contractions  Assessment and Plan  1. Pain of round ligament during pregnancy - Pain improved after 10069mTylenol - Encouraged Tylenol prn at home, heat/ice - Encouraged pt to use maternity support belt that she was recently fitted for  2. Bacterial vaginosis - Flagyl 5009mID x7 days sent to pharmacy  3. [redacted] weeks gestation of pregnancy - Preterm labor precautions reviewed - Follow up as scheduled for OB appointment   DanRenee HarderNM 02/24/2020, 12:01 PM

## 2020-02-25 LAB — GC/CHLAMYDIA PROBE AMP (~~LOC~~) NOT AT ARMC
Chlamydia: NEGATIVE
Comment: NEGATIVE
Comment: NORMAL
Neisseria Gonorrhea: NEGATIVE

## 2020-02-27 ENCOUNTER — Other Ambulatory Visit: Payer: Medicaid Other

## 2020-02-27 ENCOUNTER — Encounter: Payer: Self-pay | Admitting: Obstetrics and Gynecology

## 2020-02-27 ENCOUNTER — Other Ambulatory Visit: Payer: Self-pay

## 2020-02-27 ENCOUNTER — Ambulatory Visit (INDEPENDENT_AMBULATORY_CARE_PROVIDER_SITE_OTHER): Payer: Medicaid Other | Admitting: Obstetrics and Gynecology

## 2020-02-27 VITALS — BP 118/78 | HR 99 | Wt 205.7 lb

## 2020-02-27 DIAGNOSIS — Z3A29 29 weeks gestation of pregnancy: Secondary | ICD-10-CM

## 2020-02-27 DIAGNOSIS — F4323 Adjustment disorder with mixed anxiety and depressed mood: Secondary | ICD-10-CM

## 2020-02-27 DIAGNOSIS — O99343 Other mental disorders complicating pregnancy, third trimester: Secondary | ICD-10-CM

## 2020-02-27 DIAGNOSIS — E039 Hypothyroidism, unspecified: Secondary | ICD-10-CM

## 2020-02-27 DIAGNOSIS — O0990 Supervision of high risk pregnancy, unspecified, unspecified trimester: Secondary | ICD-10-CM

## 2020-02-27 DIAGNOSIS — Z3A28 28 weeks gestation of pregnancy: Secondary | ICD-10-CM

## 2020-02-27 DIAGNOSIS — O99283 Endocrine, nutritional and metabolic diseases complicating pregnancy, third trimester: Secondary | ICD-10-CM

## 2020-02-27 NOTE — Patient Instructions (Signed)
Glucose Tolerance Test During Pregnancy Why am I having this test? The glucose tolerance test (GTT) is done to check how your body processes sugar (glucose). This is one of several tests used to diagnose diabetes that develops during pregnancy (gestational diabetes mellitus). Gestational diabetes is a temporary form of diabetes that some women develop during pregnancy. It usually occurs during the second trimester of pregnancy and goes away after delivery. Testing (screening) for gestational diabetes usually occurs between 24 and 28 weeks of pregnancy. You may have the GTT test after having a 1-hour glucose screening test if the results from that test indicate that you may have gestational diabetes. You may also have this test if:  You have a history of gestational diabetes.  You have a history of giving birth to very large babies or have experienced repeated fetal loss (stillbirth).  You have signs and symptoms of diabetes, such as: ? Changes in your vision. ? Tingling or numbness in your hands or feet. ? Changes in hunger, thirst, and urination that are not otherwise explained by your pregnancy. What is being tested? This test measures the amount of glucose in your blood at different times during a period of 3 hours. This indicates how well your body is able to process glucose. What kind of sample is taken?  Blood samples are required for this test. They are usually collected by inserting a needle into a blood vessel. How do I prepare for this test?  For 3 days before your test, eat normally. Have plenty of carbohydrate-rich foods.  Follow instructions from your health care provider about: ? Eating or drinking restrictions on the day of the test. You may be asked to not eat or drink anything other than water (fast) starting 8-10 hours before the test. ? Changing or stopping your regular medicines. Some medicines may interfere with this test. Tell a health care provider about:  All  medicines you are taking, including vitamins, herbs, eye drops, creams, and over-the-counter medicines.  Any blood disorders you have.  Any surgeries you have had.  Any medical conditions you have. What happens during the test? First, your blood glucose will be measured. This is referred to as your fasting blood glucose, since you fasted before the test. Then, you will drink a glucose solution that contains a certain amount of glucose. Your blood glucose will be measured again 1, 2, and 3 hours after drinking the solution. This test takes about 3 hours to complete. You will need to stay at the testing location during this time. During the testing period:  Do not eat or drink anything other than the glucose solution.  Do not exercise.  Do not use any products that contain nicotine or tobacco, such as cigarettes and e-cigarettes. If you need help stopping, ask your health care provider. The testing procedure may vary among health care providers and hospitals. How are the results reported? Your results will be reported as milligrams of glucose per deciliter of blood (mg/dL) or millimoles per liter (mmol/L). Your health care provider will compare your results to normal ranges that were established after testing a large group of people (reference ranges). Reference ranges may vary among labs and hospitals. For this test, common reference ranges are:  Fasting: less than 95-105 mg/dL (5.3-5.8 mmol/L).  1 hour after drinking glucose: less than 180-190 mg/dL (10.0-10.5 mmol/L).  2 hours after drinking glucose: less than 155-165 mg/dL (8.6-9.2 mmol/L).  3 hours after drinking glucose: 140-145 mg/dL (7.8-8.1 mmol/L). What do the   results mean? Results within reference ranges are considered normal, meaning that your glucose levels are well-controlled. If two or more of your blood glucose levels are high, you may be diagnosed with gestational diabetes. If only one level is high, your health care  provider may suggest repeat testing or other tests to confirm a diagnosis. Talk with your health care provider about what your results mean. Questions to ask your health care provider Ask your health care provider, or the department that is doing the test:  When will my results be ready?  How will I get my results?  What are my treatment options?  What other tests do I need?  What are my next steps? Summary  The glucose tolerance test (GTT) is one of several tests used to diagnose diabetes that develops during pregnancy (gestational diabetes mellitus). Gestational diabetes is a temporary form of diabetes that some women develop during pregnancy.  You may have the GTT test after having a 1-hour glucose screening test if the results from that test indicate that you may have gestational diabetes. You may also have this test if you have any symptoms or risk factors for gestational diabetes.  Talk with your health care provider about what your results mean. This information is not intended to replace advice given to you by your health care provider. Make sure you discuss any questions you have with your health care provider. Document Revised: 07/06/2018 Document Reviewed: 10/25/2016 Elsevier Patient Education  [place]. Round Ligament Pain  The round ligament is a cord of muscle and tissue that helps support the uterus. It can become a source of pain during pregnancy if it becomes stretched or twisted as the baby grows. The pain usually begins in the second trimester (13-28 weeks) of pregnancy, and it can come and go until the baby is delivered. It is not a serious problem, and it does not cause harm to the baby. Round ligament pain is usually a short, sharp, and pinching pain, but it can also be a dull, lingering, and aching pain. The pain is felt in the lower side of the abdomen or in the groin. It usually starts deep in the groin and moves up to the outside of the hip area. The  pain may occur when you:  Suddenly change position, such as quickly going from a sitting to standing position.  Roll over in bed.  Cough or sneeze.  Do physical activity. Follow these instructions at home:   Watch your condition for any changes.  When the pain starts, relax. Then try any of these methods to help with the pain: ? Sitting down. ? Flexing your knees up to your abdomen. ? Lying on your side with one pillow under your abdomen and another pillow between your legs. ? Sitting in a warm bath for 15-20 minutes or until the pain goes away.  Take over-the-counter and prescription medicines only as told by your health care provider.  Move slowly when you sit down or stand up.  Avoid long walks if they cause pain.  Stop or reduce your physical activities if they cause pain.  Keep all follow-up visits as told by your health care provider. This is important. Contact a health care provider if:  Your pain does not go away with treatment.  You feel pain in your back that you did not have before.  Your medicine is not helping. Get help right away if:  You have a fever or chills.  You develop uterine  contractions.  You have vaginal bleeding.  You have nausea or vomiting.  You have diarrhea.  You have pain when you urinate. Summary  Round ligament pain is felt in the lower abdomen or groin. It is usually a short, sharp, and pinching pain. It can also be a dull, lingering, and aching pain.  This pain usually begins in the second trimester (13-28 weeks). It occurs because the uterus is stretching with the growing baby, and it is not harmful to the baby.  You may notice the pain when you suddenly change position, when you cough or sneeze, or during physical activity.  Relaxing, flexing your knees to your abdomen, lying on one side, or taking a warm bath may help to get rid of the pain.  Get help from your health care provider if the pain does not go away or if you  have vaginal bleeding, nausea, vomiting, diarrhea, or painful urination. This information is not intended to replace advice given to you by your health care provider. Make sure you discuss any questions you have with your health care provider. Document Revised: 08/31/2017 Document Reviewed: 08/31/2017 Elsevier Patient Education  [place] of Pregnancy  The third trimester is from week 28 through week 40 (months 7 through 9). This trimester is when your unborn baby (fetus) is growing very fast. At the end of the ninth month, the unborn baby is about 20 inches in length. It weighs about 6-10 pounds. Follow these instructions at home: Medicines  Take over-the-counter and prescription medicines only as told by your doctor. Some medicines are safe and some medicines are not safe during pregnancy.  Take a prenatal vitamin that contains at least 600 micrograms (mcg) of folic acid.  If you have trouble pooping (constipation), take medicine that will make your stool soft (stool softener) if your doctor approves. Eating and drinking   Eat regular, healthy meals.  Avoid raw meat and uncooked cheese.  If you get low calcium from the food you eat, talk to your doctor about taking a daily calcium supplement.  Eat four or five small meals rather than three large meals a day.  Avoid foods that are high in fat and sugars, such as fried and sweet foods.  To prevent constipation: ? Eat foods that are high in fiber, like fresh fruits and vegetables, whole grains, and beans. ? Drink enough fluids to keep your pee (urine) clear or pale yellow. Activity  Exercise only as told by your doctor. Stop exercising if you start to have cramps.  Avoid heavy lifting, wear low heels, and sit up straight.  Do not exercise if it is too hot, too humid, or if you are in a place of great height (high altitude).  You may continue to have sex unless your doctor tells you not to. Relieving  pain and discomfort  Wear a good support bra if your breasts are tender.  Take frequent breaks and rest with your legs raised if you have leg cramps or low back pain.  Take warm water baths (sitz baths) to soothe pain or discomfort caused by hemorrhoids. Use hemorrhoid cream if your doctor approves.  If you develop puffy, bulging veins (varicose veins) in your legs: ? Wear support hose or compression stockings as told by your doctor. ? Raise (elevate) your feet for 15 minutes, 3-4 times a day. ? Limit salt in your food. Safety  Wear your seat belt when driving.  Make a list of emergency phone numbers, including  numbers for family, friends, the hospital, and police and fire departments. Preparing for your baby's arrival To prepare for the arrival of your baby:  Take prenatal classes.  Practice driving to the hospital.  Visit the hospital and tour the maternity area.  Talk to your work about taking leave once the baby comes.  Pack your hospital bag.  Prepare the baby's room.  Go to your doctor visits.  Buy a rear-facing car seat. Learn how to install it in your car. General instructions  Do not use hot tubs, steam rooms, or saunas.  Do not use any products that contain nicotine or tobacco, such as cigarettes and e-cigarettes. If you need help quitting, ask your doctor.  Do not drink alcohol.  Do not douche or use tampons or scented sanitary pads.  Do not cross your legs for long periods of time.  Do not travel for long distances unless you must. Only do so if your doctor says it is okay.  Visit your dentist if you have not gone during your pregnancy. Use a soft toothbrush to brush your teeth. Be gentle when you floss.  Avoid cat litter boxes and soil used by cats. These carry germs that can cause birth defects in the baby and can cause a loss of your baby (miscarriage) or stillbirth.  Keep all your prenatal visits as told by your doctor. This is important. Contact  a doctor if:  You are not sure if you are in labor or if your water has broken.  You are dizzy.  You have mild cramps or pressure in your lower belly.  You have a nagging pain in your belly area.  You continue to feel sick to your stomach, you throw up, or you have watery poop.  You have bad smelling fluid coming from your vagina.  You have pain when you pee. Get help right away if:  You have a fever.  You are leaking fluid from your vagina.  You are spotting or bleeding from your vagina.  You have severe belly cramps or pain.  You lose or gain weight quickly.  You have trouble catching your breath and have chest pain.  You notice sudden or extreme puffiness (swelling) of your face, hands, ankles, feet, or legs.  You have not felt the baby move in over an hour.  You have severe headaches that do not go away with medicine.  You have trouble seeing.  You are leaking, or you are having a gush of fluid, from your vagina before you are 37 weeks.  You have regular belly spasms (contractions) before you are 37 weeks. Summary  The third trimester is from week 28 through week 40 (months 7 through 9). This time is when your unborn baby is growing very fast.  Follow your doctor's advice about medicine, food, and activity.  Get ready for the arrival of your baby by taking prenatal classes, getting all the baby items ready, preparing the baby's room, and visiting your doctor to be checked.  Get help right away if you are bleeding from your vagina, or you have chest pain and trouble catching your breath, or if you have not felt your baby move in over an hour. This information is not intended to replace advice given to you by your health care provider. Make sure you discuss any questions you have with your health care provider. Document Revised: 07/06/2018 Document Reviewed: 04/20/2016 Elsevier Patient Education  [place].

## 2020-02-27 NOTE — Progress Notes (Signed)
Pt presents for ROB and 2 gtt labs Pt declined flu and tdap vaccines

## 2020-02-27 NOTE — Progress Notes (Signed)
   PRENATAL VISIT NOTE  Subjective:  Emma Robbins is a 29 y.o. G1P0 at [redacted]w[redacted]d being seen today for ongoing prenatal care.  She is currently monitored for the following issues for this high-risk pregnancy and has OBESITY; BULIMIA NERVOSA; ALLERGIC CONJUNCTIVITIS; Acquired hypothyroidism; Adjustment disorder with mixed anxiety and depressed mood; and Supervision of high risk pregnancy, antepartum on their problem list.  Patient doing well with no acute concerns today. She reports lower abdominal pain with walking or position change. Consistent with round ligament pain.  Contractions: Not present. Vag. Bleeding: None.  Movement: Present. Denies leaking of fluid.   Reviewed chart, LMP incorrectly listed as 08/06/19, actually it was 08/13/19.  This is the LMP used in the growth ultrasounds and is more consistent with current EDC.  Current EDC is 05/19/20.  It has been changed in the record.  The following portions of the patient's history were reviewed and updated as appropriate: allergies, current medications, past family history, past medical history, past social history, past surgical history and problem list. Problem list updated.  Objective:   Vitals:   02/27/20 0832  BP: 118/78  Pulse: 99  Weight: 205 lb 11.2 oz (93.3 kg)    Fetal Status: Fetal Heart Rate (bpm): 150   Movement: Present     General:  Alert, oriented and cooperative. Patient is in no acute distress.  Skin: Skin is warm and dry. No rash noted.   Cardiovascular: Normal heart rate noted  Respiratory: Normal respiratory effort, no problems with respiration noted  Abdomen: Soft, gravid, appropriate for gestational age.  Pain/Pressure: Present     Pelvic: Cervical exam deferred        Extremities: Normal range of motion.  Edema: None  Mental Status:  Normal mood and affect. Normal behavior. Normal judgment and thought content.   Assessment and Plan:  Pregnancy: G1P0 at [redacted]w[redacted]d  1. Supervision of high risk  pregnancy, antepartum 28 week labs today - Glucose Tolerance, 2 Hours w/1 Hour - HIV Antibody (routine testing w rflx) - RPR - CBC  2. Acquired hypothyroidism Recheck TSH today, pt compliant with levothyroxine - TSH  3. Adjustment disorder with mixed anxiety and depressed mood   4. [redacted] weeks gestation of pregnancy   Preterm labor symptoms and general obstetric precautions including but not limited to vaginal bleeding, contractions, leaking of fluid and fetal movement were reviewed in detail with the patient.  Please refer to After Visit Summary for other counseling recommendations.   Return in about 2 weeks (around 03/12/2020) for [facility], in person.   Lynnda Shields, MD

## 2020-02-28 LAB — HIV ANTIBODY (ROUTINE TESTING W REFLEX): HIV Screen 4th Generation wRfx: NONREACTIVE

## 2020-02-28 LAB — GLUCOSE TOLERANCE, 2 HOURS W/ 1HR
Glucose, 1 hour: 136 mg/dL (ref 65–179)
Glucose, 2 hour: 116 mg/dL (ref 65–152)
Glucose, Fasting: 82 mg/dL (ref 65–91)

## 2020-02-28 LAB — CBC
Hematocrit: 36.8 % (ref 34.0–46.6)
Hemoglobin: 12.5 g/dL (ref 11.1–15.9)
MCH: 33.3 pg — ABNORMAL HIGH (ref 26.6–33.0)
MCHC: 34 g/dL (ref 31.5–35.7)
MCV: 98 fL — ABNORMAL HIGH (ref 79–97)
Platelets: 256 10*3/uL (ref 150–450)
RBC: 3.75 x10E6/uL — ABNORMAL LOW (ref 3.77–5.28)
RDW: 12.3 % (ref 11.7–15.4)
WBC: 13.3 10*3/uL — ABNORMAL HIGH (ref 3.4–10.8)

## 2020-02-28 LAB — RPR: RPR Ser Ql: NONREACTIVE

## 2020-02-28 LAB — TSH: TSH: 3.58 u[IU]/mL (ref 0.450–4.500)

## 2020-03-12 ENCOUNTER — Other Ambulatory Visit: Payer: Self-pay

## 2020-03-12 ENCOUNTER — Ambulatory Visit (INDEPENDENT_AMBULATORY_CARE_PROVIDER_SITE_OTHER): Payer: Medicaid Other | Admitting: Obstetrics & Gynecology

## 2020-03-12 VITALS — BP 95/67 | HR 78 | Wt 208.0 lb

## 2020-03-12 DIAGNOSIS — O0993 Supervision of high risk pregnancy, unspecified, third trimester: Secondary | ICD-10-CM

## 2020-03-12 DIAGNOSIS — Z3A30 30 weeks gestation of pregnancy: Secondary | ICD-10-CM

## 2020-03-12 DIAGNOSIS — E039 Hypothyroidism, unspecified: Secondary | ICD-10-CM

## 2020-03-12 DIAGNOSIS — O0990 Supervision of high risk pregnancy, unspecified, unspecified trimester: Secondary | ICD-10-CM

## 2020-03-12 NOTE — Progress Notes (Signed)
   PRENATAL VISIT NOTE  Subjective:  Emma Robbins is a 29 y.o. G1P0 at [redacted]w[redacted]d being seen today for ongoing prenatal care.  She is currently monitored for the following issues for this high-risk pregnancy and has OBESITY; BULIMIA NERVOSA; ALLERGIC CONJUNCTIVITIS; Acquired hypothyroidism; Adjustment disorder with mixed anxiety and depressed mood; Supervision of high risk pregnancy, antepartum; and [redacted] weeks gestation of pregnancy on their problem list.  Patient reports no complaints.  Contractions: Not present. Vag. Bleeding: None.  Movement: Present. Denies leaking of fluid.   The following portions of the patient's history were reviewed and updated as appropriate: allergies, current medications, past family history, past medical history, past social history, past surgical history and problem list.   Objective:   Vitals:   03/12/20 1557  BP: 95/67  Pulse: 78  Weight: 208 lb (94.3 kg)    Fetal Status: Fetal Heart Rate (bpm): 150 Fundal Height: 30 cm Movement: Present     General:  Alert, oriented and cooperative. Patient is in no acute distress.  Skin: Skin is warm and dry. No rash noted.   Cardiovascular: Normal heart rate noted  Respiratory: Normal respiratory effort, no problems with respiration noted  Abdomen: Soft, gravid, appropriate for gestational age.  Pain/Pressure: Present     Pelvic: Cervical exam deferred        Extremities: Normal range of motion.     Mental Status: Normal mood and affect. Normal behavior. Normal judgment and thought content.   Assessment and Plan:  Pregnancy: G1P0 at [redacted]w[redacted]d 1. Acquired hypothyroidism Normal TSH  2. Supervision of high risk pregnancy, antepartum Normal growth  Preterm labor symptoms and general obstetric precautions including but not limited to vaginal bleeding, contractions, leaking of fluid and fetal movement were reviewed in detail with the patient. Please refer to After Visit Summary for other counseling  recommendations.   Return in about 2 weeks (around 03/26/2020).  Future Appointments  Date Time Provider [place]  03/17/2020  7:30 AM [facility]UR[facility]  03/17/2020  7:45 AM [facility]    Emeterio Reeve, MD

## 2020-03-12 NOTE — Patient Instructions (Signed)

## 2020-03-12 NOTE — Progress Notes (Signed)
Pt states she is having some yellowish d/c. Pt is also having some lower pelvic pain with driving.  Pt states there was discussion of possibly changing her Thyroid medication, please discuss.

## 2020-03-17 ENCOUNTER — Ambulatory Visit: Payer: Medicaid Other | Attending: Obstetrics and Gynecology

## 2020-03-17 ENCOUNTER — Other Ambulatory Visit: Payer: Self-pay

## 2020-03-17 ENCOUNTER — Other Ambulatory Visit: Payer: Self-pay | Admitting: *Deleted

## 2020-03-17 ENCOUNTER — Encounter: Payer: Self-pay | Admitting: *Deleted

## 2020-03-17 ENCOUNTER — Ambulatory Visit: Payer: Medicaid Other | Admitting: *Deleted

## 2020-03-17 DIAGNOSIS — Z3A31 31 weeks gestation of pregnancy: Secondary | ICD-10-CM | POA: Diagnosis not present

## 2020-03-17 DIAGNOSIS — O43199 Other malformation of placenta, unspecified trimester: Secondary | ICD-10-CM | POA: Diagnosis present

## 2020-03-17 DIAGNOSIS — O0990 Supervision of high risk pregnancy, unspecified, unspecified trimester: Secondary | ICD-10-CM | POA: Diagnosis present

## 2020-03-17 DIAGNOSIS — E039 Hypothyroidism, unspecified: Secondary | ICD-10-CM

## 2020-03-17 DIAGNOSIS — Z362 Encounter for other antenatal screening follow-up: Secondary | ICD-10-CM | POA: Diagnosis not present

## 2020-03-27 ENCOUNTER — Other Ambulatory Visit (HOSPITAL_COMMUNITY)
Admission: RE | Admit: 2020-03-27 | Discharge: 2020-03-27 | Disposition: A | Discharge status: Home / Self Care | Payer: Medicaid Other | Source: Ambulatory Visit | Attending: Obstetrics & Gynecology | Admitting: Obstetrics & Gynecology

## 2020-03-27 ENCOUNTER — Ambulatory Visit (INDEPENDENT_AMBULATORY_CARE_PROVIDER_SITE_OTHER): Payer: Medicaid Other | Admitting: Obstetrics & Gynecology

## 2020-03-27 ENCOUNTER — Other Ambulatory Visit: Payer: Self-pay

## 2020-03-27 VITALS — BP 107/70 | HR 87 | Wt 211.0 lb

## 2020-03-27 DIAGNOSIS — O0990 Supervision of high risk pregnancy, unspecified, unspecified trimester: Secondary | ICD-10-CM | POA: Diagnosis present

## 2020-03-27 DIAGNOSIS — Z3A21 21 weeks gestation of pregnancy: Secondary | ICD-10-CM | POA: Insufficient documentation

## 2020-03-27 DIAGNOSIS — Z3402 Encounter for supervision of normal first pregnancy, second trimester: Secondary | ICD-10-CM | POA: Insufficient documentation

## 2020-03-27 DIAGNOSIS — E039 Hypothyroidism, unspecified: Secondary | ICD-10-CM

## 2020-03-27 MED ORDER — PANTOPRAZOLE SODIUM 20 MG PO TBEC: 20.0000 mg | DELAYED_RELEASE_TABLET | Freq: Every day | ORAL | 1 refills | Status: DC

## 2020-03-27 NOTE — Progress Notes (Signed)
° °  PRENATAL VISIT NOTE  Subjective:  Emma Robbins is a 29 y.o. G1P0 at [redacted]w[redacted]d being seen today for ongoing prenatal care.  She is currently monitored for the following issues for this high-risk pregnancy and has OBESITY; BULIMIA NERVOSA; ALLERGIC CONJUNCTIVITIS; Acquired hypothyroidism; Adjustment disorder with mixed anxiety and depressed mood; Supervision of high risk pregnancy, antepartum; and [redacted] weeks gestation of pregnancy on their problem list.  Patient reports vaginal irritation.  Contractions: Not present. Vag. Bleeding: None.  Movement: Present. Denies leaking of fluid.   The following portions of the patient's history were reviewed and updated as appropriate: allergies, current medications, past family history, past medical history, past social history, past surgical history and problem list.   Objective:   Vitals:   03/27/20 1548  BP: 107/70  Pulse: 87  Weight: 211 lb (95.7 kg)    Fetal Status: Fetal Heart Rate (bpm): 160   Movement: Present     General:  Alert, oriented and cooperative. Patient is in no acute distress.  Skin: Skin is warm and dry. No rash noted.   Cardiovascular: Normal heart rate noted  Respiratory: Normal respiratory effort, no problems with respiration noted  Abdomen: Soft, gravid, appropriate for gestational age.  Pain/Pressure: Present     Pelvic: Cervical exam deferred        Extremities: Normal range of motion.     Mental Status: Normal mood and affect. Normal behavior. Normal judgment and thought content.   Assessment and Plan:  Pregnancy: G1P0 at [redacted]w[redacted]d 1. Supervision of high risk pregnancy, antepartum Yellow discharge seen - Cervicovaginal ancillary only( [facility]) - pantoprazole (PROTONIX) 20 MG tablet; Take 1 tablet (20 mg total) by mouth daily.  Dispense: 30 tablet; Refill: 1 Nausea 2. Acquired hypothyroidism Results for ALLECIA, BELLS (MRN [id number]) as of 03/27/2020 16:24  Ref. Range 02/27/2020 09:12  TSH  Latest Ref Range: 0.450 - 4.500 uIU/mL 3.580    Preterm labor symptoms and general obstetric precautions including but not limited to vaginal bleeding, contractions, leaking of fluid and fetal movement were reviewed in detail with the patient. Please refer to After Visit Summary for other counseling recommendations.   Return in about 2 weeks (around 04/10/2020).  Future Appointments  Date Time Provider Department Center  04/17/2020  7:15 AM [facility] NURSE [facility]  04/17/2020  7:30 AM [facility] US3 [facility]    Scheryl Darter, MD

## 2020-03-27 NOTE — Patient Instructions (Signed)

## 2020-03-27 NOTE — Progress Notes (Signed)
Pt is having lower back pain and yellowish discharge. Pt is having episodes of vomiting daily- does not have medication.

## 2020-03-29 NOTE — L&D Delivery Note (Signed)
Delivery Note   First Stage: Labor onset: 05/21/2020 at 0130 Augmentation : n/a Analgesia Lorriane Shire intrapartum: n/a ROM:At delivery at 0847   Time in tub: 0730 Water temperature: 100.1 Time out of tub:0855   Second Stage: Complete dilation at 0805 Onset of pushing at 0807, involuntary pushing with urge to push FHR second stage: intermittent auscultation 130 at 0845    At 8:47 AM a viable and healthy female was delivered via Vaginal, Spontaneous (Presentation:  LOA ).    Third Stage: Pt. Assisted to bed after delivery and cord clamped for delivery of placenta.  Placenta delivered spontaneous with light cord traction only, intact. Placenta disposition: schultz Fundal massage, Pitocin via IV for prevention of postpartum hemorrhage   Lacerations and repair 1st degree right labial, 3.0 rapide Est. Blood Loss (mL): 50   Complications: none   Mom to postpartum.  Baby to Couplet care / Skin to Skin.   Newborn: Birth Weight: pending Apgar Scores: 8-9 Feeding planned: Breast  [name], CNM 9:32 AM

## 2020-03-31 LAB — CERVICOVAGINAL ANCILLARY ONLY
Bacterial Vaginitis (gardnerella): NEGATIVE
Candida Glabrata: NEGATIVE
Candida Vaginitis: NEGATIVE
Chlamydia: NEGATIVE
Comment: NEGATIVE
Comment: NEGATIVE
Comment: NEGATIVE
Comment: NEGATIVE
Comment: NEGATIVE
Comment: NORMAL
Neisseria Gonorrhea: NEGATIVE
Trichomonas: NEGATIVE

## 2020-04-04 ENCOUNTER — Telehealth: Payer: Self-pay

## 2020-04-04 NOTE — Telephone Encounter (Signed)
Call patient inform her that tyenol is safe to take. She should monitor her system and drink lots of fluid. Patient should inform anyone she has come in contact with. Patient voice understanding at this time.

## 2020-04-11 ENCOUNTER — Telehealth: Payer: Medicaid Other | Admitting: Obstetrics and Gynecology

## 2020-04-17 ENCOUNTER — Ambulatory Visit: Payer: Medicaid Other

## 2020-04-18 ENCOUNTER — Other Ambulatory Visit: Payer: Self-pay

## 2020-04-18 ENCOUNTER — Ambulatory Visit (HOSPITAL_BASED_OUTPATIENT_CLINIC_OR_DEPARTMENT_OTHER): Payer: Medicaid Other

## 2020-04-18 ENCOUNTER — Encounter: Payer: Self-pay | Admitting: Obstetrics & Gynecology

## 2020-04-18 ENCOUNTER — Ambulatory Visit (INDEPENDENT_AMBULATORY_CARE_PROVIDER_SITE_OTHER): Payer: Medicaid Other | Admitting: Obstetrics & Gynecology

## 2020-04-18 ENCOUNTER — Ambulatory Visit: Payer: Medicaid Other | Attending: Obstetrics and Gynecology | Admitting: *Deleted

## 2020-04-18 ENCOUNTER — Encounter: Payer: Self-pay | Admitting: *Deleted

## 2020-04-18 ENCOUNTER — Other Ambulatory Visit (HOSPITAL_COMMUNITY)
Admission: RE | Admit: 2020-04-18 | Discharge: 2020-04-18 | Disposition: A | Discharge status: Home / Self Care | Payer: Medicaid Other | Source: Ambulatory Visit | Attending: Obstetrics and Gynecology | Admitting: Obstetrics and Gynecology

## 2020-04-18 VITALS — BP 97/68 | HR 76 | Wt 208.8 lb

## 2020-04-18 DIAGNOSIS — O283 Abnormal ultrasonic finding on antenatal screening of mother: Secondary | ICD-10-CM | POA: Diagnosis not present

## 2020-04-18 DIAGNOSIS — E669 Obesity, unspecified: Secondary | ICD-10-CM

## 2020-04-18 DIAGNOSIS — O99213 Obesity complicating pregnancy, third trimester: Secondary | ICD-10-CM

## 2020-04-18 DIAGNOSIS — E039 Hypothyroidism, unspecified: Secondary | ICD-10-CM | POA: Diagnosis not present

## 2020-04-18 DIAGNOSIS — Z3A35 35 weeks gestation of pregnancy: Secondary | ICD-10-CM

## 2020-04-18 DIAGNOSIS — O0990 Supervision of high risk pregnancy, unspecified, unspecified trimester: Secondary | ICD-10-CM

## 2020-04-18 DIAGNOSIS — O0993 Supervision of high risk pregnancy, unspecified, third trimester: Secondary | ICD-10-CM

## 2020-04-18 DIAGNOSIS — O99282 Endocrine, nutritional and metabolic diseases complicating pregnancy, second trimester: Secondary | ICD-10-CM | POA: Diagnosis not present

## 2020-04-18 DIAGNOSIS — O99283 Endocrine, nutritional and metabolic diseases complicating pregnancy, third trimester: Secondary | ICD-10-CM | POA: Diagnosis not present

## 2020-04-18 MED ORDER — HYDROCORTISONE ACETATE 25 MG RE SUPP: 25.0000 mg | Freq: Two times a day (BID) | RECTAL | 0 refills | Status: DC

## 2020-04-18 NOTE — Progress Notes (Signed)
Pt presents for ROB c/o rectal bleeding d/t hemorrhoids.

## 2020-04-18 NOTE — Patient Instructions (Signed)

## 2020-04-18 NOTE — Progress Notes (Signed)
PRENATAL VISIT NOTE  Subjective:  Emma Robbins is a 30 y.o. G1P0 at 44w4dbeing seen today for ongoing prenatal care.  She is currently monitored for the following issues for this low-risk pregnancy and has OBESITY; BULIMIA NERVOSA; ALLERGIC CONJUNCTIVITIS; Acquired hypothyroidism; Adjustment disorder with mixed anxiety and depressed mood; Supervision of high risk pregnancy, antepartum; and [redacted] weeks gestation of pregnancy on their problem list.  Patient reports occasional contractions and bleeding hemorrhoid.  Contractions: Irritability. Vag. Bleeding: None.  Movement: Present. Denies leaking of fluid.   The following portions of the patient's history were reviewed and updated as appropriate: allergies, current medications, past family history, past medical history, past social history, past surgical history and problem list.   Objective:   Vitals:   04/18/20 0913  BP: 97/68  Pulse: 76  Weight: 208 lb 12.8 oz (94.7 kg)    Fetal Status: Fetal Heart Rate (bpm): 135   Movement: Present  Presentation: Vertex  General:  Alert, oriented and cooperative. Patient is in no acute distress.  Skin: Skin is warm and dry. No rash noted.   Cardiovascular: Normal heart rate noted  Respiratory: Normal respiratory effort, no problems with respiration noted  Abdomen: Soft, gravid, appropriate for gestational age.  Pain/Pressure: Present     Pelvic: Cervical exam performed in the presence of a chaperone Dilation: 2 Effacement (%): 50 Station: -3  Extremities: Normal range of motion.  Edema: None  Mental Status: Normal mood and affect. Normal behavior. Normal judgment and thought content.   Assessment and Plan:  Pregnancy: G1P0 at 330w4d. Supervision of high risk pregnancy, antepartum Routine test, external hemorrhoid seen - Strep Gp B NAA - Cervicovaginal ancillary only - hydrocortisone (ANUSOL-HC) 25 MG suppository; Place 1 suppository (25 mg total) rectally 2 (two) times daily.   Dispense: 12 suppository; Refill: 0  2. Acquired hypothyroidism Current Outpatient Medications on File Prior to Visit  Medication Sig Dispense Refill  . Blood Pressure Monitoring (BLOOD PRESSURE KIT) DEVI 1 kit by Does not apply route as needed. 1 each 0  . Elastic Bandages & Supports (COMFORT FIT MATERNITY SUPP SM) MISC Wear as directed. 1 each 0  . levothyroxine (EUTHYROX) 25 MCG tablet Take 1 tablet (25 mcg total) by mouth daily before breakfast. 30 tablet 11  . Doxylamine-Pyridoxine (DICLEGIS) 10-10 MG TBEC 1 tab in AM, 1 tab mid afternoon 2 tabs at bedtime. Max dose 4 tabs daily. (Patient not taking: Reported on 01/18/2020) 100 tablet 5  . pantoprazole (PROTONIX) 20 MG tablet Take 1 tablet (20 mg total) by mouth daily. (Patient not taking: Reported on 04/18/2020) 30 tablet 1  . Prenatal Vit-Fe Fumarate-FA (MULTIVITAMIN-PRENATAL) 27-0.8 MG TABS tablet Take 1 tablet by mouth daily at 12 noon. (Patient not taking: Reported on 01/18/2020)    . terconazole (TERAZOL 7) 0.4 % vaginal cream Place 1 applicator vaginally at bedtime. (Patient not taking: Reported on 01/18/2020) 45 g 0   No current facility-administered medications on file prior to visit.     Preterm labor symptoms and general obstetric precautions including but not limited to vaginal bleeding, contractions, leaking of fluid and fetal movement were reviewed in detail with the patient. Please refer to After Visit Summary for other counseling recommendations.   Return in about 1 week (around 04/25/2020).  Future Appointments  Date Time Provider [place]1/21/2022  1:30 PM [facility]1/21/2022  1:45 PM [facility] US6 [facility]/28/2022 10:10 AM Nugent, NiGerrie NordmannNP [facility] None  Emeterio Reeve, MD

## 2020-04-20 LAB — STREP GP B NAA: Strep Gp B NAA: POSITIVE — AB

## 2020-04-21 LAB — CERVICOVAGINAL ANCILLARY ONLY
Chlamydia: NEGATIVE
Comment: NEGATIVE
Comment: NEGATIVE
Comment: NORMAL
Neisseria Gonorrhea: NEGATIVE
Trichomonas: NEGATIVE

## 2020-04-25 ENCOUNTER — Ambulatory Visit (INDEPENDENT_AMBULATORY_CARE_PROVIDER_SITE_OTHER): Payer: Medicaid Other | Admitting: Obstetrics and Gynecology

## 2020-04-25 ENCOUNTER — Other Ambulatory Visit: Payer: Self-pay

## 2020-04-25 ENCOUNTER — Encounter: Payer: Self-pay | Admitting: Obstetrics and Gynecology

## 2020-04-25 VITALS — BP 110/75 | HR 109 | Wt 211.0 lb

## 2020-04-25 DIAGNOSIS — O0990 Supervision of high risk pregnancy, unspecified, unspecified trimester: Secondary | ICD-10-CM

## 2020-04-25 DIAGNOSIS — O99210 Obesity complicating pregnancy, unspecified trimester: Secondary | ICD-10-CM

## 2020-04-25 DIAGNOSIS — O99820 Streptococcus B carrier state complicating pregnancy: Secondary | ICD-10-CM

## 2020-04-25 DIAGNOSIS — E039 Hypothyroidism, unspecified: Secondary | ICD-10-CM

## 2020-04-25 NOTE — Patient Instructions (Signed)
Considering Waterbirth? Guide for patients at [facility] Why consider waterbirth? . Gentle birth for babies  . Less pain medicine used in labor  . May allow for passive descent/less pushing  . May reduce perineal tears  . More mobility and instinctive maternal position changes  . Increased maternal relaxation   Is waterbirth safe? What are the risks of infection, drowning or other complications? . Infection:  . Very low risk (3.7 % for tub vs 4.8% for bed)  . 7 in 8000 waterbirths with documented infection  . Poorly cleaned equipment most common cause  . Slightly lower group B strep transmission rate  . Drowning  . Maternal:  . Very low risk  . Related to seizures or fainting  . Newborn:  . Very low risk. No evidence of increased risk of respiratory problems in multiple large studies  . Physiological protection from breathing under water  . Avoid underwater birth if there are any fetal complications  . Once baby's head is out of the water, keep it out.  . Birth complication  . Some reports of cord trauma, but risk decreased by bringing baby to surface gradually  . No evidence of increased risk of shoulder dystocia. Mothers can usually change positions faster in water than in a bed, possibly aiding the maneuvers to free the shoulder.   There are 2 things you MUST do to have a waterbirth with [facility]: 1. Attend a waterbirth class at [facility]   a. 3rd Wednesday of every month from 7-9 pm (virtual during COVID) b. Free c. Register by calling [phone] or register online at [email or web address] d. Bring us the certificate from the class to your prenatal appointment or send via MyChart 2. Meet with a midwife at 36 weeks* to see if you can still plan a waterbirth and to sign the consent.   *We also recommend that you schedule as many of your prenatal visits with a midwife as possible.    Helpful information: . You may  want to bring a bathing suit top to the hospital to wear during labor but this is optional.  All other supplies are provided by the hospital. . Please arrive at the hospital with signs of active labor, and do not wait at home until late in labor. It takes 45 min- 2 hours for COVID testing, fetal monitoring, and check in to your room to take place, plus transport and filling of the waterbirth tub.    Things that would prevent you from having a waterbirth: . Unknown or Positive COVID-19 diagnosis upon admission to hospital* . Premature, <37wks  . Previous cesarean birth  . Presence of thick meconium-stained fluid  . Multiple gestation (Twins, triplets, etc.)  . Uncontrolled diabetes or gestational diabetes requiring medication  . Hypertension diagnosed in pregnancy or preexisting hypertension (gestational hypertension, preeclampsia, or chronic hypertension) . Heavy vaginal bleeding  . Non-reassuring fetal heart rate  . Active infection (MRSA, etc.). Group B Strep is NOT a contraindication for waterbirth.  . If your labor has to be induced and induction method requires continuous monitoring of the baby's heart rate  . Other risks/issues identified by your obstetrical provider   Please remember that birth is unpredictable. Under certain unforeseeable circumstances your provider may advise against giving birth in the tub. These decisions will be made on a case-by-case basis and with the safety of you and your baby as our highest priority.   *Please remember that in order   to have a waterbirth, you must test Negative to COVID-19 upon admission to the hospital.  Updated 02/12/2020  AREA PEDIATRIC/FAMILY [place] [phone]) . [facility] Davy Pique, MD; Gwendlyn Deutscher, MD; Walker Kehr, MD; Andria Frames, MD; McDiarmid, MD; Dutch Quint, MD; Nori Riis, MD; Mingo Amber, [address] [place], [place] [address] o [phone] o Mon-Fri 8:30-12:30,  1:30-5:00 o Providers come to see babies at [facility] o Accepting Medicaid . [place] [facility] [place] providers who accept newborns: Dorthy Cooler, MD; Orland Mustard, MD; Stephanie Acre, MD o [place], [place], [place] [address] o [phone] o Mon-Fri 8:00-5:30 o Babies seen by providers at [facility] o Does NOT accept Medicaid o Please call early in hospitalization for appointment (limited availability)  . Mustard [place], MD o [address] [place], [place] [address] o [phone] o Mon, Tue, Thur, Fri 8:30-5:00, Wed 10:00-7:00 (closed 1-2pm) o Babies seen by [facility] providers o Accepting Medicaid . Walls, MD o [place], [place], [place] [address] o [phone] o Mon-Fri 8:30-5:00, Sat 8:30-12:00 o Provider comes to see babies at [place] Medicaid o Must have been referred from current patients or contacted office prior to delivery . [place] [facility] ([place] [facility] [place]) Franne Forts, MD; Tamera Punt, MD; Doneen Poisson, MD; Fatima Sanger, MD; Wynetta Emery, MD; Jess Barters, MD; Tami Ribas, MD; Herbert Moors, MD; Derrell Lolling, MD; Dorothyann Peng, MD; Lucious Groves, NP; Baldo Ash, NP o [address], [place], [place] [address] o [phone] o Mon, Tue, Thur, Fri 8:30-5:30, Wed 9:30-5:30, Sat 8:30-12:30 o Babies seen by [facility] providers o Accepting Medicaid o Only accepting infants of first-time parents or siblings of current patients [facility] discharge coordinator will make follow-up appointment . Baltazar Najjar o [place] [address], [place], [place]  [address] o [phone]   Fax - [phone] . [facility] o [address], [place], [place]  [address] o Phone - [phone]   Fax [phone] . [place], [place], [place], [place]  [address] o [phone]  [place]  [phone]) . [facility] Reginal Lutes, MD; Jacklynn Ganong, MD; Torrie Mayers, MD; MD; Rosana Hoes, MD; Servando Salina, MD; Rose Fillers, MD; Rex Kras, MD; Corinna Capra, MD; Volney American, MD; Trilby Drummer, MD; Janann Colonel, MD; Jimmye Norman, [place], [place], [place] [address] o [phone] o Mon-Fri 8:30-5:00 (extended evenings Mon-Thur as needed), Sat-Sun 10:00-1:00 o Providers come to see babies at [place] Medicaid for families of first-time babies and families with all children in the household age 82 and under. Must register with office prior to making appointment (M-F only). . [place], NP; Tomi Bamberger, MD; Redmond School, MD; [place], [place] [address]., [place], [place] [address] o [phone] o Mon-Fri 8:00-5:00 o Babies seen by providers at [facility] o Does NOT accept Medicaid/Commercial Insurance Only . [facility] [place] ([facility])  Marnee Guarneri, MD; Drema Dallas, MD; Montine Circle, MD; Vilma Prader, MD; Vanita Panda, MD; Alfonso Ramus, MD; Ruthann Cancer, MD; Roxanne Mins, MD; Rosalva Ferron, MD; Polly Cobia, MD o [address] [place], [place] [address] o [phone] o Mon-Fri 8:30-5:30, Sat (Oct.-Mar.) 9:00-1:00 o Babies seen by providers at [place] [phone]) . [facility] Peers, MD; Suzan Slick, MD o [place] [address], [place], [place] [address] o [phone] o Mon-Fri 8:30-5:00, Sat  8:30-12:00 o Providers come to see babies at [facility] o Does NOT accept Medicaid . [place] at [place], [place]; [place], MD; [place], [place]; Nancy Fetter, MD; Moreen Fowler, [place], [place], [place] [address] o [phone] o Mon-Fri 8:00-5:00 o Babies seen by providers at [facility] o Does NOT accept Medicaid o Only accepting babies of parents who are patients o Please call early in hospitalization for appointment (limited availability) . [facility]  Pediatricians Blanca Friend, MD; Sharlene Motts, MD; Rod Can, MD; Warner Mccreedy, NP; Sabra Heck, MD; Ermalinda Memos, MD; Sharlett Iles, NP; Aurther Loft, MD; Jerrye Beavers, MD; Marcello Moores, MD; Berline Lopes, MD; Charolette Forward, MD o [place]. [place], [place], [place] [address] o [phone] o Mon-Fri 8:00-5:00, Sat 9:00-12:00 o Providers come to see babies at [facility] o Does NOT accept [facility] [phone]) . [place] [facility] [place] providers accepting new patients: Dayna Ramus, NP; [place], [place], [place], [place] [address] o [phone] o Mon-Fri 8:00-5:00 o Babies seen by providers at [facility] o Does NOT accept Medicaid o Only accepting babies of parents who are patients o Please call early in hospitalization for appointment (limited availability) . [facility] Oswaldo Conroy, MD; Sheran Lawless, MD o [place]., [place], [place] [address] o [phone] (press 1 to schedule appointment) o Mon-Fri 8:00-5:00 o Providers come to see babies at [place] [facility] o Does NOT accept Medicaid . [facility] Jodi Mourning, MD o [address] [place], [place] [address] o [phone] o Mon-Fri 8:30-5:00 (lunch 12:30-1:00), extended hours by appointment only Wed 5:00-6:30 o Babies seen by [facility] providers o Accepting Medicaid . [place] at Evalyn Casco, MD; [place], MD; Ethlyn Gallery, MD o [place], [place], [place] [address] o [phone] o Mon-Fri 8:00-5:00 o Babies seen by [facility] providers o Does NOT accept Medicaid . Therapist, music at [place], MD; Yong Channel, MD; [place], [address] [place], [place] [address] o [phone] o Mon-Fri 8:00-5:00 o Babies seen by [facility] providers o Does NOT accept Medicaid . [place], [place]; [place], [place]; [place], NP; Albertina Parr, MD; Frederic Jericho, MD; Ronney Lion, MD; Carlos Levering, NP; Jerelene Redden, NP; Tomasita Crumble, NP; Ronelle Nigh, NP; Corinna Lines, MD; [place], MD o [address] [place], [place] [address] o [phone] o Mon-Fri 8:30-5:00, Sat 10:00-1:00 o Providers come to see babies at [facility] o Does NOT accept Medicaid o Free prenatal information session Tuesdays at 4:45pm . [facility] Porfirio Oar, MD; [place], [place]; [place], [place]; [place], [place]., [place] [address] o [phone] o Mon-Fri 7:30-5:30 o Babies seen by [facility] providers . [facility] o [address], [place], [place], [place]  [address] o [phone]   Fax - [phone]  [place] [phone] & [phone]) . [place] [address] [place], [place] [address] o [phone] o Mon-Thur 8:00-6:00 o Providers come to see babies at [place] Medicaid . [place], NP; [place]ord Aase, MD; [place], [place]; [place], [place]., [place], [place] [address] o [phone] o Mon-Thur 7:30-7:30, Fri 7:30-4:30 o Babies seen by [facility] providers o Accepting Medicaid . [facility] Nyra Jabs, MD; Cristino Martes, NP; Gertie Baron, MD o [place] [address], [place],  [address] o [phone] o Mon-Fri 8:30-5:00, Sat 8:30-12:00 o Providers come to see babies at [facility] o Accepting Medicaid o Must have "Meet &  Greet" appointment at office prior to delivery . [place] ([place]) Jodene Nam, MD; Juleen China, MD; Clydene Laming, [place] [address], [place], [place] [address] o [phone] o Mon-Wed 8:00-6:00, Thur-Fri 8:00-5:00, Sat 9:00-12:00 o Providers come to see babies at [facility] o Does NOT accept Medicaid o Only accepting siblings of current patients . [facility] [place], [place], [place], [place]  [address] o [phone]   Fax [phone] . [place] at [place] [address] [place], [place]  [address] o [phone]   Fax - [place] [phone] & [phone]) . Therapist, music at [place], DO; [place], [place]., [place], [place] [address] o [phone] o Mon-Fri 7:00-5:00 o Babies seen by [facility] providers o Does NOT accept Medicaid . [place], MD; [place], [place]; [place], [place], [place], [place] [address] o [phone] o Mon-Fri 8:00-5:00 o Babies seen by [facility] providers o Accepting Medicaid . [place], MD; [place], [place]; [place], NP; [place], [place], [place], [place] [address] o [phone] o Mon-Fri 8:00-5:00 o Babies seen by providers at [place] [phone]) . [facility] [place], [place] o [place]., [place], [place] [address] o [phone] o Mon-Fri 8:00-5:00 o Babies seen by [facility] providers o Does NOT accept Medicaid o Limited availability, please call early in hospitalization to schedule follow-up . [facility] Leilani Merl, PA; Maisie Fus, MD; [place], MD; [place], [place]; Jeannine Kitten, MD; [place], [place] [facility] [address], [place], [place] [address] o [phone] o Mon-Fri 8:30-5:00, Sat 9:00-12:00 o Babies seen by providers at [facility] o Accepting Medicaid o Please register online then schedule online or call office o [email or web address] . [place] ([place] [facility] [place]) Kristian Covey, NP; Dwyane Dee, MD; Leonidas Romberg, PA o [address] [place], [place], [place] [address] o [phone] o Mon-Fri 8:00-5:00 o Babies seen by providers at [facility] o Accepting Medicaid . [place] ([facility] [employer]) Dairl Ponder, MD;  Rayvon Char, NP; Melina Modena, MD o [address] [place], [place], [place] [address] o [phone] o Mon-Fri 8:00-5:30, Sat&Sun by appointment (phones open at 8:30) o Babies seen by [facility] providers o Accepting Medicaid o Must be a first-time baby or sibling of current patient . [place], [id number], [place], [place]  [address] o [phone]   Fax - [phone]  [place] [phone] & [phone]) . [place], [place]; [place], [place]; Benjamine Mola, MD; [place], [place]; Harrell Lark, MD o [address], [place], [place] [address] o [phone] o Mon-Thur 8:00-7:00, Fri 8:00-5:00, Sat 8:00-12:00, Sun 9:00-12:00 o Babies seen by [facility] providers o Accepting Medicaid . [facility] at [facility], MD; Ruthann Cancer, MD; [facility], MD o [address], [place], [place] [address] o [phone] o Mon-Thur 8:00-5:00 o Babies seen by providers at [facility] o Accepting Medicaid . [facility] at [facility], MD; Coe-Goins, MD; Amedeo Plenty, MD; Bobby Rumpf, MD; List, MD; [place] Drafts, MD; Ruthann Cancer, MD; Selinda Eon,  MD; Audie Box, MD; Jim Like, MD; Christie Nottingham, MD; Hubbard Hartshorn, MD; [place] Nunnery, MD o [address] [place], [place] [address] o [phone] o Mon-Fri 8:00-5:30, Sat (Oct.-Mar.) 9:00-1:00 o Babies seen by providers at [facility] o Accepting Medicaid o Must fill out new patient packet, available online at [email or web address] . [place] ([facility]) Barnabas Lister, NP; Kenton Kingfisher, NP; Claiborne Billings, NP; Rolla Plate, MD; [place], [place]; Carola Rhine, MD; Tyron Russell, MD; Delia Chimes, NP o [address], [place], [place] [address] o [phone] o Mon-Thur 8:00-5:30, Fri 8:00-5:00 o Babies seen by providers at [place] [phone]) . [place], [place];  [place], MD; Dennard Schaumann, MD; [place], [place] o [address] [place], [place] [address] o [phone] o Mon-Fri 8:00-5:00 o Babies seen by providers at [place] [phone]) . [place] at [place], [place]; Olen Pel, MD; [place], [place], [place], [place] [address] o [phone] o Mon-Fri 8:00-5:00 o Babies seen by providers at [facility] o Does NOT accept Medicaid o Limited appointment availability, please call early in hospitalization  . Therapist, music at [place], [place]; [place], [address] [place], [place] [address] o [phone] o Mon-Fri 8:00-5:00 o Babies seen by [facility] providers o Does NOT accept Medicaid . [facility] Su Grand, MD; Guy Sandifer, MD; [place], [place]; [place], [place] [address], [place], [place] [address] o [phone] o Mon-Fri 8:00-5:00 o After hours clinic [facility] [place], [place] [address]) [phone] Mon-Fri 5:00-8:00, Sat 12:00-6:00, Sun 10:00-4:00 o Babies seen by [facility] providers o Accepting Medicaid . [place] at [facility] o [address] [place], [place]  [address] o [phone]   Fax - [phone]  [place] [phone]) . Therapist, music at [facility], MD o [address] [place], [place], [place] [address] o [phone] o Mon-Fri 8:00-5:00 o Babies seen by [facility] providers o Does NOT accept Medicaid . [place] ([place] at [place]) Bing Neighbors, MD o [address] [place], [employer], [place] [address] o [phone] o Mon-Thur 8:00-7:00, Fri 8:00-5:00, Sat 8:00-12:00 o Babies seen by providers at [facility] o Accepting Medicaid - but does not have vaccinations in office (must be received elsewhere) o Limited availability, please call early in hospitalization  [place]  ([id number]) . [place], [place] [address] [place] [address] o [phone]  Fax [phone]

## 2020-04-25 NOTE — Progress Notes (Signed)
ROB 36 w GBS + Pt wants to discuss + GBS Results  Pt wants discuss delivery at [facility]. Denies having a bad experience just was  discussed with "friends". Pt has a few questions and concerns regarding delivery process.   Cervix checked declined.

## 2020-04-25 NOTE — Progress Notes (Signed)
   PRENATAL VISIT NOTE  Subjective:  Emma Robbins is a 30 y.o. G1P0 at [redacted]w[redacted]d being seen today for ongoing prenatal care.  She is currently monitored for the following issues for this low-risk pregnancy and has Obesity affecting pregnancy, antepartum; BULIMIA NERVOSA; ALLERGIC CONJUNCTIVITIS; Acquired hypothyroidism; Adjustment disorder with mixed anxiety and depressed mood; Supervision of high risk pregnancy, antepartum; and GBS (group B Streptococcus carrier), +RV culture, currently pregnant on their problem list.  Patient reports no complaints.  Contractions: Irritability. Vag. Bleeding: None.  Movement: Present. Denies leaking of fluid.   The following portions of the patient's history were reviewed and updated as appropriate: allergies, current medications, past family history, past medical history, past social history, past surgical history and problem list.   Objective:   Vitals:   04/25/20 1007  BP: 110/75  Pulse: (!) 109  Weight: 211 lb (95.7 kg)    Fetal Status: Fetal Heart Rate (bpm): 144 Fundal Height: 36 cm Movement: Present     General:  Alert, oriented and cooperative. Patient is in no acute distress.  Skin: Skin is warm and dry. No rash noted.   Cardiovascular: Normal heart rate noted  Respiratory: Normal respiratory effort, no problems with respiration noted  Abdomen: Soft, gravid, appropriate for gestational age.  Pain/Pressure: Absent     Pelvic: Cervical exam deferred        Extremities: Normal range of motion.  Edema: None  Mental Status: Normal mood and affect. Normal behavior. Normal judgment and thought content.   Assessment and Plan:  Pregnancy: G1P0 at [redacted]w[redacted]d 1. Supervision of high risk pregnancy, antepartum Patient is doing well without complaints Patient is planning condoms for contraception Patient is researching pediatrician Patient is interested in waterbirth. Information provided and will schedule patient to be seen by a midwife Patient  was initially considering a [facility] delivery but now plans to stay with [facility]  2. Acquired hypothyroidism Continue synthroid  3. Obesity affecting pregnancy, antepartum   4. GBS (group B Streptococcus carrier), +RV culture, currently pregnant Prophylaxis in labor  Preterm labor symptoms and general obstetric precautions including but not limited to vaginal bleeding, contractions, leaking of fluid and fetal movement were reviewed in detail with the patient. Please refer to After Visit Summary for other counseling recommendations.   Return in about 1 week (around 05/02/2020) for in person, ROB, Low risk.  No future appointments.  Mora Bellman, MD

## 2020-04-30 ENCOUNTER — Telehealth: Payer: Self-pay

## 2020-04-30 NOTE — Telephone Encounter (Signed)
Return call to pt regarding discomfort and concerned if she should go to MAU. C/O: Back pain , pelvic pain/pressure, stomach getting hard, lost mucous plug, vaginal discharge. Denies any leaking of fluid, no bleeding, +FM  Had contractions Tuesday night that stopped ctx's were not timed . No ctx's today Pt notes drinking 1 bottle of water a day Has not tried any comfort measures Discussed comfort measures/ labor precautions when to report to MAU. advised pt to increase water intake and eat small frequent meals and may take Tylenol Reg or extra strength. Pt advised to monitor and to feel free to contact the office with any questions or concerns.  Has upcoming appt on Monday. Pt agreeable and voiced understanding.

## 2020-05-05 ENCOUNTER — Other Ambulatory Visit: Payer: Self-pay

## 2020-05-05 ENCOUNTER — Ambulatory Visit (INDEPENDENT_AMBULATORY_CARE_PROVIDER_SITE_OTHER): Payer: Medicaid Other | Admitting: Obstetrics and Gynecology

## 2020-05-05 ENCOUNTER — Encounter: Payer: Self-pay | Admitting: Obstetrics and Gynecology

## 2020-05-05 VITALS — BP 101/63 | HR 83 | Wt 212.9 lb

## 2020-05-05 DIAGNOSIS — O99210 Obesity complicating pregnancy, unspecified trimester: Secondary | ICD-10-CM

## 2020-05-05 DIAGNOSIS — E039 Hypothyroidism, unspecified: Secondary | ICD-10-CM

## 2020-05-05 DIAGNOSIS — O99820 Streptococcus B carrier state complicating pregnancy: Secondary | ICD-10-CM

## 2020-05-05 DIAGNOSIS — O0990 Supervision of high risk pregnancy, unspecified, unspecified trimester: Secondary | ICD-10-CM

## 2020-05-05 NOTE — Progress Notes (Signed)
   PRENATAL VISIT NOTE  Subjective:  Emma Robbins is a 30 y.o. G1P0 at [redacted]w[redacted]d being seen today for ongoing prenatal care.  She is currently monitored for the following issues for this low-risk pregnancy and has Obesity affecting pregnancy, antepartum; BULIMIA NERVOSA; ALLERGIC CONJUNCTIVITIS; Acquired hypothyroidism; Adjustment disorder with mixed anxiety and depressed mood; Supervision of high risk pregnancy, antepartum; and GBS (group B Streptococcus carrier), +RV culture, currently pregnant on their problem list.  Patient reports no complaints.  Contractions: Irritability. Vag. Bleeding: None.  Movement: Present. Denies leaking of fluid.   The following portions of the patient's history were reviewed and updated as appropriate: allergies, current medications, past family history, past medical history, past social history, past surgical history and problem list.   Objective:   Vitals:   05/05/20 0905  BP: 101/63  Pulse: 83  Weight: 212 lb 14.4 oz (96.6 kg)    Fetal Status: Fetal Heart Rate (bpm): 146  Fundal Height: 37 cm Movement: Present  Presentation: Vertex  General:  Alert, oriented and cooperative. Patient is in no acute distress.  Skin: Skin is warm and dry. No rash noted.   Cardiovascular: Normal heart rate noted  Respiratory: Normal respiratory effort, no problems with respiration noted  Abdomen: Soft, gravid, appropriate for gestational age.  Pain/Pressure: Present     Pelvic: Cervical exam performed in the presence of a chaperone Dilation: 3 Effacement (%): 60 Station: -3  Extremities: Normal range of motion.  Edema: Trace  Mental Status: Normal mood and affect. Normal behavior. Normal judgment and thought content.   Assessment and Plan:  Pregnancy: G1P0 at [redacted]w[redacted]d 1. Supervision of high risk pregnancy, antepartum Patient is doing well without complaints Patient completed waterbirth class- will have her meet with midwife for consent  2. GBS (group B  Streptococcus carrier), +RV culture, currently pregnant Prophylaxis in labor  3. Obesity affecting pregnancy, antepartum   4. Acquired hypothyroidism Continue synthroid  Term labor symptoms and general obstetric precautions including but not limited to vaginal bleeding, contractions, leaking of fluid and fetal movement were reviewed in detail with the patient. Please refer to After Visit Summary for other counseling recommendations.   No follow-ups on file.  No future appointments.  Mora Bellman, MD

## 2020-05-05 NOTE — Progress Notes (Signed)
Pt presents for ROB c/o didn't sleep well last night d/t ctx's 10-15 mins apart.  Requests cx check

## 2020-05-12 ENCOUNTER — Ambulatory Visit (INDEPENDENT_AMBULATORY_CARE_PROVIDER_SITE_OTHER): Payer: Medicaid Other | Admitting: Advanced Practice Midwife

## 2020-05-12 ENCOUNTER — Other Ambulatory Visit: Payer: Self-pay

## 2020-05-12 VITALS — BP 101/70 | HR 88 | Wt 209.0 lb

## 2020-05-12 DIAGNOSIS — E039 Hypothyroidism, unspecified: Secondary | ICD-10-CM

## 2020-05-12 DIAGNOSIS — M549 Dorsalgia, unspecified: Secondary | ICD-10-CM

## 2020-05-12 DIAGNOSIS — O99820 Streptococcus B carrier state complicating pregnancy: Secondary | ICD-10-CM

## 2020-05-12 DIAGNOSIS — O99891 Other specified diseases and conditions complicating pregnancy: Secondary | ICD-10-CM

## 2020-05-12 DIAGNOSIS — O0990 Supervision of high risk pregnancy, unspecified, unspecified trimester: Secondary | ICD-10-CM

## 2020-05-12 NOTE — Patient Instructions (Signed)
Things to Try After 37 weeks to Encourage Labor/Get Ready for Labor:   1.  Try the [employer] at [email or web address] daily to improve baby's position and encourage the onset of labor.  2. Walk a little and rest a little every day.  Change positions often.  3. Cervical Ripening: May try one or both a. Red Raspberry Leaf capsules or tea:  two 300mg  or 400mg  tablets with each meal, 2-3 times a day, or 1-3 cups of tea daily  Potential Side Effects Of Raspberry Leaf:  Most women do not experience any side effects from drinking raspberry leaf tea. However, nausea and loose stools are possible   b. Evening Primrose Oil capsules: take 1 capsule by mouth and place one capsule in the vagina every night.    Some of the potential side effects:  Upset stomach  Loose stools or diarrhea  Headaches  Nausea  4. Sex can also help the cervix ripen and encourage labor onset.    Labor Precautions Reasons to come to [facility]U at [facility] [place]:  1.  Contractions are  5 minutes apart or less, each last 1 minute, these have been going on for 1-2 hours, and you cannot walk or talk during them 2.  You have a large gush of fluid, or a trickle of fluid that will not stop and you have to wear a pad 3.  You have bleeding that is bright red, heavier than spotting--like menstrual bleeding (spotting can be normal in early labor or after a check of your cervix) 4.  You do not feel the baby moving like he/she normally does

## 2020-05-12 NOTE — Progress Notes (Signed)
   PRENATAL VISIT NOTE  Subjective:  Emma Robbins is a 30 y.o. G1P0 at [redacted]w[redacted]d being seen today for ongoing prenatal care.  She is currently monitored for the following issues for this low-risk pregnancy and has Obesity affecting pregnancy, antepartum; BULIMIA NERVOSA; ALLERGIC CONJUNCTIVITIS; Acquired hypothyroidism; Adjustment disorder with mixed anxiety and depressed mood; Supervision of high risk pregnancy, antepartum; and GBS (group B Streptococcus carrier), +RV culture, currently pregnant on their problem list.  Patient reports occasional contractions.  Contractions: Irritability. Vag. Bleeding: None.  Movement: Present. Denies leaking of fluid.   The following portions of the patient's history were reviewed and updated as appropriate: allergies, current medications, past family history, past medical history, past social history, past surgical history and problem list.   Objective:   Vitals:   05/12/20 0814  BP: 101/70  Pulse: 88  Weight: 209 lb (94.8 kg)    Fetal Status: Fetal Heart Rate (bpm): 134   Movement: Present     General:  Alert, oriented and cooperative. Patient is in no acute distress.  Skin: Skin is warm and dry. No rash noted.   Cardiovascular: Normal heart rate noted  Respiratory: Normal respiratory effort, no problems with respiration noted  Abdomen: Soft, gravid, appropriate for gestational age.  Pain/Pressure: Present     Pelvic: Cervical exam deferred        Extremities: Normal range of motion.  Edema: Trace  Mental Status: Normal mood and affect. Normal behavior. Normal judgment and thought content.   Assessment and Plan:  Pregnancy: G1P0 at [redacted]w[redacted]d 1. Supervision of high risk pregnancy, antepartum --Anticipatory guidance about next visits/weeks of pregnancy given.   2. Acquired hypothyroidism --On Synthroid.  TSH 3.580 on 02/27/20.  3. GBS (group B Streptococcus carrier), +RV culture, currently pregnant --Needs prophylaxis in labor  4.  Back pain affecting pregnancy in third trimester --Pt describes back pain as intermittent, occurs more at night when she first sits down to rest for the day.  It is 8/10 and can make her cry when it occurs but is intermittent. --Rest/ice/heat/warm bath/Tylenol/pregnancy support belt --Pt has support belt but has not been wearing it. Encouraged to wear it during the day to prevent some of the evening soreness/cramping/back pain. --Urine culture today  Term labor symptoms and general obstetric precautions including but not limited to vaginal bleeding, contractions, leaking of fluid and fetal movement were reviewed in detail with the patient. Please refer to After Visit Summary for other counseling recommendations.   Return in about 1 week (around 05/19/2020).  Future Appointments  Date Time Provider [place]  05/19/2020  9:55 AM [place], Kathie Dike, CNM [facility] None  05/26/2020  6:30 AM [facility] [place] None    [name], CNM

## 2020-05-12 NOTE — Progress Notes (Signed)
ROB c/o back pain 8/10 x 8 days, hemorrhoids, needs Rx.

## 2020-05-15 ENCOUNTER — Telehealth (HOSPITAL_COMMUNITY): Payer: Self-pay | Admitting: *Deleted

## 2020-05-15 LAB — CULTURE, OB URINE

## 2020-05-15 LAB — URINE CULTURE, OB REFLEX

## 2020-05-15 NOTE — Telephone Encounter (Signed)
Preadmission screen  

## 2020-05-18 ENCOUNTER — Other Ambulatory Visit: Payer: Self-pay | Admitting: Advanced Practice Midwife

## 2020-05-19 ENCOUNTER — Telehealth (HOSPITAL_COMMUNITY): Payer: Self-pay | Admitting: *Deleted

## 2020-05-19 ENCOUNTER — Encounter (HOSPITAL_COMMUNITY): Payer: Self-pay | Admitting: *Deleted

## 2020-05-19 ENCOUNTER — Other Ambulatory Visit: Payer: Self-pay | Admitting: Advanced Practice Midwife

## 2020-05-19 ENCOUNTER — Encounter: Payer: Medicaid Other | Admitting: Advanced Practice Midwife

## 2020-05-19 MED ORDER — FLUCONAZOLE 150 MG PO TABS: 150.0000 mg | ORAL_TABLET | Freq: Once | ORAL | 0 refills | Status: AC

## 2020-05-19 NOTE — Telephone Encounter (Signed)
Preadmission screen  

## 2020-05-21 ENCOUNTER — Other Ambulatory Visit: Payer: Self-pay

## 2020-05-21 ENCOUNTER — Inpatient Hospital Stay (HOSPITAL_COMMUNITY)
Admission: AD | Admit: 2020-05-21 | Discharge: 2020-05-23 | DRG: 807 | Disposition: A | Discharge status: Home / Self Care | Payer: Medicaid Other | Attending: Obstetrics and Gynecology | Admitting: Obstetrics and Gynecology

## 2020-05-21 ENCOUNTER — Encounter (HOSPITAL_COMMUNITY): Payer: Self-pay | Admitting: Obstetrics and Gynecology

## 2020-05-21 ENCOUNTER — Encounter: Payer: Medicaid Other | Admitting: Women's Health

## 2020-05-21 DIAGNOSIS — O0990 Supervision of high risk pregnancy, unspecified, unspecified trimester: Secondary | ICD-10-CM

## 2020-05-21 DIAGNOSIS — O480 Post-term pregnancy: Secondary | ICD-10-CM | POA: Diagnosis not present

## 2020-05-21 DIAGNOSIS — Z3A40 40 weeks gestation of pregnancy: Secondary | ICD-10-CM

## 2020-05-21 DIAGNOSIS — O26893 Other specified pregnancy related conditions, third trimester: Secondary | ICD-10-CM | POA: Diagnosis present

## 2020-05-21 DIAGNOSIS — E039 Hypothyroidism, unspecified: Secondary | ICD-10-CM | POA: Diagnosis present

## 2020-05-21 DIAGNOSIS — O99284 Endocrine, nutritional and metabolic diseases complicating childbirth: Principal | ICD-10-CM | POA: Diagnosis present

## 2020-05-21 DIAGNOSIS — O99820 Streptococcus B carrier state complicating pregnancy: Secondary | ICD-10-CM

## 2020-05-21 DIAGNOSIS — O99824 Streptococcus B carrier state complicating childbirth: Secondary | ICD-10-CM | POA: Diagnosis present

## 2020-05-21 LAB — TYPE AND SCREEN
ABO/RH(D): O POS
Antibody Screen: NEGATIVE

## 2020-05-21 LAB — CBC
HCT: 37.9 % (ref 36.0–46.0)
Hemoglobin: 12.9 g/dL (ref 12.0–15.0)
MCH: 32.3 pg (ref 26.0–34.0)
MCHC: 34 g/dL (ref 30.0–36.0)
MCV: 94.8 fL (ref 80.0–100.0)
Platelets: 277 10*3/uL (ref 150–400)
RBC: 4 MIL/uL (ref 3.87–5.11)
RDW: 13.2 % (ref 11.5–15.5)
WBC: 12.9 10*3/uL — ABNORMAL HIGH (ref 4.0–10.5)
nRBC: 0 % (ref 0.0–0.2)

## 2020-05-21 LAB — RPR: RPR Ser Ql: NONREACTIVE

## 2020-05-21 MED ORDER — LACTATED RINGERS IV SOLN: 500.0000 mL | INTRAVENOUS | Status: DC | PRN

## 2020-05-21 MED ORDER — ZOLPIDEM TARTRATE 5 MG PO TABS: 5.0000 mg | ORAL_TABLET | Freq: Every evening | ORAL | Status: DC | PRN

## 2020-05-21 MED ORDER — LIDOCAINE HCL (PF) 1 % IJ SOLN
30.0000 mL | INTRAMUSCULAR | Status: AC | PRN
  Administered 2020-05-21: 30 mL via SUBCUTANEOUS
  Filled 2020-05-21: qty 30

## 2020-05-21 MED ORDER — OXYCODONE-ACETAMINOPHEN 5-325 MG PO TABS: 2.0000 | ORAL_TABLET | ORAL | Status: DC | PRN

## 2020-05-21 MED ORDER — FLEET ENEMA 7-19 GM/118ML RE ENEM: 1.0000 | ENEMA | RECTAL | Status: DC | PRN

## 2020-05-21 MED ORDER — SODIUM CHLORIDE 0.9 % IV SOLN: 1.0000 g | INTRAVENOUS | Status: DC

## 2020-05-21 MED ORDER — COCONUT OIL OIL: 1.0000 "application " | TOPICAL_OIL | Status: DC | PRN

## 2020-05-21 MED ORDER — SODIUM CHLORIDE 0.9 % IV SOLN
2.0000 g | Freq: Once | INTRAVENOUS | Status: AC
  Administered 2020-05-21: 2 g via INTRAVENOUS
  Filled 2020-05-21: qty 2000

## 2020-05-21 MED ORDER — TETANUS-DIPHTH-ACELL PERTUSSIS 5-2.5-18.5 LF-MCG/0.5 IM SUSY: 0.5000 mL | PREFILLED_SYRINGE | Freq: Once | INTRAMUSCULAR | Status: DC

## 2020-05-21 MED ORDER — OXYTOCIN-SODIUM CHLORIDE 30-0.9 UT/500ML-% IV SOLN
2.5000 [IU]/h | INTRAVENOUS | Status: DC
  Administered 2020-05-21: 2.5 [IU]/h via INTRAVENOUS
  Filled 2020-05-21: qty 500

## 2020-05-21 MED ORDER — PRENATAL MULTIVITAMIN CH
1.0000 | ORAL_TABLET | Freq: Every day | ORAL | Status: DC
Start: 2020-05-21 — End: 2020-05-23
  Administered 2020-05-23: 1 via ORAL
  Filled 2020-05-21 (×2): qty 1

## 2020-05-21 MED ORDER — ACETAMINOPHEN 325 MG PO TABS: 650.0000 mg | ORAL_TABLET | ORAL | Status: DC | PRN

## 2020-05-21 MED ORDER — FENTANYL CITRATE (PF) 100 MCG/2ML IJ SOLN: 100.0000 ug | INTRAMUSCULAR | Status: DC | PRN

## 2020-05-21 MED ORDER — HYDROXYZINE HCL 50 MG PO TABS
50.0000 mg | ORAL_TABLET | Freq: Four times a day (QID) | ORAL | Status: DC | PRN
  Filled 2020-05-21: qty 1

## 2020-05-21 MED ORDER — SOD CITRATE-CITRIC ACID 500-334 MG/5ML PO SOLN: 30.0000 mL | ORAL | Status: DC | PRN

## 2020-05-21 MED ORDER — IBUPROFEN 600 MG PO TABS
600.0000 mg | ORAL_TABLET | Freq: Four times a day (QID) | ORAL | Status: DC
  Administered 2020-05-21 – 2020-05-23 (×3): 600 mg via ORAL
  Filled 2020-05-21 (×4): qty 1

## 2020-05-21 MED ORDER — OXYCODONE-ACETAMINOPHEN 5-325 MG PO TABS: 1.0000 | ORAL_TABLET | ORAL | Status: DC | PRN

## 2020-05-21 MED ORDER — ONDANSETRON HCL 4 MG/2ML IJ SOLN: 4.0000 mg | Freq: Four times a day (QID) | INTRAMUSCULAR | Status: DC | PRN

## 2020-05-21 MED ORDER — LACTATED RINGERS IV SOLN: INTRAVENOUS | Status: DC

## 2020-05-21 MED ORDER — ONDANSETRON HCL 4 MG/2ML IJ SOLN: 4.0000 mg | INTRAMUSCULAR | Status: DC | PRN

## 2020-05-21 MED ORDER — WITCH HAZEL-GLYCERIN EX PADS: 1.0000 "application " | MEDICATED_PAD | CUTANEOUS | Status: DC | PRN

## 2020-05-21 MED ORDER — OXYTOCIN BOLUS FROM INFUSION
333.0000 mL | Freq: Once | INTRAVENOUS | Status: AC
  Administered 2020-05-21: 333 mL via INTRAVENOUS

## 2020-05-21 MED ORDER — DIBUCAINE (PERIANAL) 1 % EX OINT: 1.0000 "application " | TOPICAL_OINTMENT | CUTANEOUS | Status: DC | PRN

## 2020-05-21 MED ORDER — DIPHENHYDRAMINE HCL 25 MG PO CAPS: 25.0000 mg | ORAL_CAPSULE | Freq: Four times a day (QID) | ORAL | Status: DC | PRN

## 2020-05-21 MED ORDER — BENZOCAINE-MENTHOL 20-0.5 % EX AERO
1.0000 "application " | INHALATION_SPRAY | CUTANEOUS | Status: DC | PRN
  Administered 2020-05-21: 1 via TOPICAL
  Filled 2020-05-21: qty 56

## 2020-05-21 MED ORDER — SIMETHICONE 80 MG PO CHEW: 80.0000 mg | CHEWABLE_TABLET | ORAL | Status: DC | PRN

## 2020-05-21 MED ORDER — SENNOSIDES-DOCUSATE SODIUM 8.6-50 MG PO TABS
2.0000 | ORAL_TABLET | Freq: Every day | ORAL | Status: DC
  Administered 2020-05-23: 2 via ORAL
  Filled 2020-05-21: qty 2

## 2020-05-21 MED ORDER — ONDANSETRON HCL 4 MG PO TABS: 4.0000 mg | ORAL_TABLET | ORAL | Status: DC | PRN

## 2020-05-21 NOTE — H&P (Signed)
Emma Robbins is a 30 y.o. G1P0 female at 2w2dby 22wk u/s, presenting in active labor.  Regular contractions since 0230. Wants waterbirth, attended class and has signed consent. Reports active fetal movement, contractions: regular, vaginal bleeding: none, membranes: intact.  [facility]   Most recent u/s 1/21, EFW 22%, 5lb6oz @ 35wks.   This pregnancy complicated by: Hypothyroidism on synthroid  Prenatal History/Complications:  Primigravida  Past Medical History: Past Medical History:  Diagnosis Date  . Anxiety   . Depression   . Gallstone 11/2011  . Headache(784.0)    unspecified - 2-3 x/week  . Hemorrhoids   . Thyroid disease     Past Surgical History: Past Surgical History:  Procedure Laterality Date  . BREAST SURGERY  2012   Fibroadenoma  . CHOLECYSTECTOMY  12/09/2011   Procedure: LAPAROSCOPIC CHOLECYSTECTOMY;  Surgeon: CHaywood Lasso MD;  [place] [facility]  Service: General;  Laterality: N/A;  . LAPAROSCOPIC OVARIAN CYSTECTOMY  12/16/10   Right cystadenofibroma    Obstetrical History: OB History    Gravida  1   Para      Term      Preterm      AB      Living  0     SAB      IAB      Ectopic      Multiple      Live Births              Social History: Social History   Socioeconomic History  . Marital status: Married    Spouse name: Not on file  . Number of children: Not on file  . Years of education: Not on file  . Highest education level: Not on file  Occupational History  . Not on file  Tobacco Use  . Smoking status: Never Smoker  . Smokeless tobacco: Never Used  Vaping Use  . Vaping Use: Never used  Substance and Sexual Activity  . Alcohol use: Not Currently    Alcohol/week: 3.0 standard drinks    Types: 3 Standard drinks or equivalent per week    Comment: Rare  . Drug use: Not Currently    Types: Cocaine    Comment: per pt did Cocaine 2 yrs ago.05-2017  .  Sexual activity: Yes    Birth control/protection: Condom    Comment: 1st intercourse 89yo-2 partners  Other Topics Concern  . Not on file  Social History Narrative  . Not on file   Social Determinants of Health   Financial Resource Strain: Not on file  Food Insecurity: Not on file  Transportation Needs: Not on file  Physical Activity: Not on file  Stress: Not on file  Social Connections: Not on file    Family History: Family History  Problem Relation Age of Onset  . Hyperlipidemia Father   . Thyroid disease Father   . Diabetes Father   . Thyroid disease Brother   . Thyroid disease Sister   . Hypertension Maternal Grandmother   . Diabetes Paternal Grandmother   . Hyperlipidemia Paternal Grandmother   . Cancer Paternal Grandfather        Prostate    Allergies: No Known Allergies  Medications Prior to Admission  Medication Sig Dispense Refill Last Dose  . levothyroxine (EUTHYROX) 25 MCG tablet Take 1 tablet (25 mcg total) by mouth daily before breakfast. 30 tablet 11 05/20/2020 at Unknown time  . Blood Pressure Monitoring (BLOOD PRESSURE  KIT) DEVI 1 kit by Does not apply route as needed. 1 each 0   . Elastic Bandages & Supports (COMFORT FIT MATERNITY SUPP SM) MISC Wear as directed. (Patient not taking: Reported on 05/05/2020) 1 each 0   . hydrocortisone (ANUSOL-HC) 25 MG suppository Place 1 suppository (25 mg total) rectally 2 (two) times daily. (Patient not taking: Reported on 04/18/2020) 12 suppository 0   . pantoprazole (PROTONIX) 20 MG tablet Take 1 tablet (20 mg total) by mouth daily. (Patient not taking: Reported on 04/18/2020) 30 tablet 1     Review of Systems  Pertinent pos/neg as indicated in HPI  Blood pressure 120/84, pulse 76, temperature 98.5 F (36.9 C), temperature source Oral, resp. rate 19, height 5' 4"  (1.626 m), weight 97.6 kg, last menstrual period 08/06/2019. General appearance: alert, cooperative and no distress Lungs: clear to auscultation  bilaterally Heart: regular rate and rhythm Abdomen: gravid, soft, non-tender Extremities: tr edema   Fetal monitoring: FHR: 125 bpm, variability: moderate,  Accelerations: Present,  decelerations:  Absent Uterine activity: regular q 2-49mns Dilation: 9 Effacement (%): 90 Station: 0 Exam by:: E Chipps RN Presentation: cephalic   Prenatal labs: ABO, Rh: --/--/O POS (02/23 01610 Antibody: NEG (02/23 0615) Rubella: 1.11 (10/12 1632) RPR: Non Reactive (12/01 0912)  HBsAg: Negative (10/12 1632)  HIV: Non Reactive (12/01 0912)  GBS: Positive/-- (01/21 0953)  2hr GTT: normal Results for orders placed or performed during the hospital encounter of 05/21/20 (from the past 24 hour(s))  CBC   Collection Time: 05/21/20  6:14 AM  Result Value Ref Range   WBC 12.9 (H) 4.0 - 10.5 K/uL   RBC 4.00 3.87 - 5.11 MIL/uL   Hemoglobin 12.9 12.0 - 15.0 g/dL   HCT 37.9 36.0 - 46.0 %   MCV 94.8 80.0 - 100.0 fL   MCH 32.3 26.0 - 34.0 pg   MCHC 34.0 30.0 - 36.0 g/dL   RDW 13.2 11.5 - 15.5 %   Platelets 277 150 - 400 K/uL   nRBC 0.0 0.0 - 0.2 %  Type and screen [place]  Collection Time: 05/21/20  6:15 AM  Result Value Ref Range   ABO/RH(D) O POS    Antibody Screen NEG    Sample Expiration      05/24/2020,2359 Performed at [facility] [address] [place] [id number]     Assessment:  465w2dIUP  G1P0  Spontaneous labor  Cat 1 FHR  GBS Positive/-- (01/21 0953)   Wants waterbirth, has had class and consent  Plan:  Admit to L&D  IV pain meds/epidural prn active labor  Expectant management  Amp for GBS+ advanced dilation  Anticipate NSVB waterbirth   Plans to breastfeed  Contraception: POPs  Circumcision: n/a  KiRoma SchanzNM, WHNP-BC 05/21/2020, 8:19 AM

## 2020-05-21 NOTE — Progress Notes (Signed)
Emma Robbins is a 31 y.o. G1P0 at [redacted]w[redacted]d by LMP admitted for active labor  Subjective: Pt in birth tub, husband at bedside for support.  Involuntary pushing with some contractions.    Objective: BP 120/84   Pulse 76   Temp 98.5 F (36.9 C) (Oral)   Resp 19   Ht 5\' 4"  (1.626 m)   Wt 97.6 kg   LMP 08/06/2019   BMI 36.92 kg/m  No intake/output data recorded. No intake/output data recorded.  FHT:  135, no decels by doppler UC:   regular, every 2-3 minutes timed, moderate to strong to palpation SVE:   Dilation: 9 Effacement (%): 90 Station: 0 Exam by:: E Chipps RN  Labs: Lab Results  Component Value Date   WBC 12.9 (H) 05/21/2020   HGB 12.9 05/21/2020   HCT 37.9 05/21/2020   MCV 94.8 05/21/2020   PLT 277 05/21/2020    Assessment / Plan: Spontaneous labor, progressing normally  Labor: Progressing normally Preeclampsia:  n/a Fetal Wellbeing:  Category I Pain Control:  Water tub I/D:  GBS positive, ampicillin dose given Anticipated MOD:  NSVD  Emma Robbins 05/21/2020, 8:19 AM

## 2020-05-21 NOTE — MAU Note (Signed)
PT SAYS UC STRONG SINCE 0230.  WANTS WATERBIRTH. PNC WITH  FAMINA- VE 3 CM.  DENIES HSV AND MRSA. GBS- POSITIVE

## 2020-05-21 NOTE — Lactation Note (Signed)
This note was copied from a baby's chart. Lactation Consultation Note  Patient Name: Emma Robbins [id number] Date: 05/21/2020 Reason for consult: L&D Initial assessment Age:30 hours LC assisted with latch attempt. Parents able to return demonstrate teaching. Mother's nipples inverting during this visit but with good elasticity. Discussed normalcy of infant's feeding cues/attempts at this time. May benefit from nipple shield if baby struggles to achieve latch today. Left mom and baby sts and continuing to practice. FOB assisting. Parents are aware that [facility] assistance is available on MBU. Maternal Data Does the patient have breastfeeding experience prior to this delivery?: No  Feeding Mother's Current Feeding Choice: Breast Milk  LATCH Score Latch: Repeated attempts needed to sustain latch, nipple held in mouth throughout feeding, stimulation needed to elicit sucking reflex.  Audible Swallowing: None  Type of Nipple: Inverted  Comfort (Breast/Nipple): Soft / non-tender  Hold (Positioning): Assistance needed to correctly position infant at breast and maintain latch.  LATCH Score: 4   Consult Status Consult Status: Follow-up Follow-up type: In-patient   Gwynne Edinger, MA IBCLC 05/21/2020, 9:47 AM

## 2020-05-21 NOTE — Lactation Note (Signed)
This note was copied from a baby's chart. Lactation Consultation Note  Patient Name: Emma Robbins [id number] Date: 05/21/2020 Reason for consult: Initial assessment;1st time breastfeeding;Term;Maternal endocrine disorder Age:30 years  LC Student Emma Robbins and Emma Robbins arrived in the room. Mother was in bed. Father was holding infant. [employer] Student asked how was breastfeeding going. Mother reported it was good and that the infant latched for about 2 minutes. LC informed mother about colostrum, stomach sizes, cluster feeding, breast changes, self-care, and frequency of feedings. LC Emma Robbins reviewed hand expression. There was no visible colostrum but there was glistening. Mother had very inverted nipples but they did evert with stimulation. [employer] Student educated mother about manual pump. She was shown how to put it together, use it, and educated on how to clean it. Mother is on [employer] but does not currently have a pump at home. Mother was advised to let Emma Robbins know she had the baby so she can see about getting a pump. Emma Robbins Student gave mother the brochure about breastfeeding and informed her about outpatient consults. Mother and Father stated they had no further questions at this time.    Maternal Data Has patient been taught Hand Expression?: Yes Does the patient have breastfeeding experience prior to this delivery?: No  Feeding Mother's Current Feeding Choice: Breast Milk   Lactation Tools Discussed/Used Tools: Pump;Flanges Flange Size: 24 Breast pump type: Manual Pump Education: Setup, frequency, and cleaning;Milk Storage Reason for Pumping: nipple eversion Pumping frequency: pre-pumping  Interventions Interventions: Breast feeding basics reviewed;Breast massage;Hand express;Pre-pump if needed;Expressed milk;Hand pump   PLAN:  1. Feed infant 8-12 times within 24 hours 2. Pre pump to stimulate breasts   Discharge Pump: Manual WIC Program: Yes  Consult Status Consult Status:  Follow-up Date: 05/22/20 Follow-up type: In-patient    Emma Robbins student 05/21/2020, 1:04 PM  I concur with the information presented above.  Emma Robbins 05/21/2020, 1:07 PM

## 2020-05-21 NOTE — Social Work (Signed)
MOB was referred for history of depression, anxiety and bulimia.   * Referral screened out by Clinical Social Worker because none of the following criteria appear to apply:  ~ History of anxiety/depression during this pregnancy, or of post-partum depression following prior delivery. ~ Diagnosis of anxiety and/or depression within last 3 years. CSW reviewed chart and notes a diagnosis of depression/anxiety in 2013. Bulimia diagnosis date of 2009. OR * MOB's symptoms currently being treated with medication and/or therapy.  Please contact the Clinical Social Worker if needs arise, by [facility] request, or if MOB scores greater than 9/yes to question 10 on [place] Postpartum Depression Screen.  Darra Lis, [place] [employer]  [phone]

## 2020-05-21 NOTE — Discharge Summary (Signed)
Postpartum Discharge Summary      Patient Name: Emma Robbins DOB: 19-Aug-1990 MRN: [id number]  Date of admission: 05/21/2020 Delivery date:05/21/2020  Delivering provider: [name] A  Date of discharge: 05/23/2020  Admitting diagnosis: Normal labor [O80, Z37.9] Intrauterine pregnancy: [redacted]w[redacted]d    Secondary diagnosis:  Active Problems:   Normal labor  Additional problems: none    Discharge diagnosis: Term Pregnancy Delivered                                              Post partum procedures:none Augmentation: N/A Complications: None  Hospital course: Onset of Labor With Vaginal Delivery      30y.o. yo G1P0 at 414w2das admitted in Active Labor on 05/21/2020. Patient had an uncomplicated labor course as follows:  Membrane Rupture Time/Date: 8:47 AM ,05/21/2020   Delivery Method:Vaginal, Spontaneous  Episiotomy: None  Lacerations:  1st degree;Labial    Pt delivered in waterbirth tub without complications.  Patient had an uncomplicated postpartum course.  She is ambulating, tolerating a regular diet, passing flatus, and urinating well. Patient is discharged home in stable condition on 05/23/20.  Newborn Data: Birth date:05/21/2020  Birth time:8:47 AM  Gender:Female  Living status:Living  Apgars:8 ,9  Weight:2946 g   Magnesium Sulfate received: No BMZ received: No Rhophylac:No MMR:No T-DaP:declined Flu: No Transfusion:No  Physical exam  Vitals:   05/22/20 0030 05/22/20 0500 05/22/20 1253 05/22/20 2138  BP: 107/72 93/62 110/74 116/77  Pulse: 85 82 86 87  Resp: 18 18 18 20   Temp: 97.9 F (36.6 C) 98 F (36.7 C) 98.2 F (36.8 C) 98.2 F (36.8 C)  TempSrc: Oral Oral Oral Oral  SpO2: 99% 99% 100% 99%  Weight:      Height:       General: alert, cooperative and no distress Lochia: appropriate Uterine Fundus: firm Incision: N/A DVT Evaluation: No evidence of DVT seen on physical exam. Negative Homan's sign. No cords or calf tenderness. No  significant calf/ankle edema. Labs: Lab Results  Component Value Date   WBC 12.9 (H) 05/21/2020   HGB 12.9 05/21/2020   HCT 37.9 05/21/2020   MCV 94.8 05/21/2020   PLT 277 05/21/2020   CMP Latest Ref Rng & Units 01/03/2017  Glucose 65 - 99 mg/dL 89  BUN 7 - 25 mg/dL 9  Creatinine 0.50 - 1.10 mg/dL 0.91  Sodium 135 - 146 mmol/L 140  Potassium 3.5 - 5.3 mmol/L 4.8  Chloride 98 - 110 mmol/L 103  CO2 20 - 32 mmol/L 26  Calcium 8.6 - 10.2 mg/dL 9.7  Total Protein 6.1 - 8.1 g/dL 6.9  Total Bilirubin 0.2 - 1.2 mg/dL 0.6  Alkaline Phos 39 - 117 U/L -  AST 10 - 30 U/L 21  ALT 6 - 29 U/L 34(H)   EdFlavia Shippercore: Edinburgh Postnatal Depression Scale Screening Tool 05/21/2020  I have been able to laugh and see the funny side of things. 0  I have looked forward with enjoyment to things. 0  I have blamed myself unnecessarily when things went wrong. 0  I have been anxious or worried for no good reason. 0  I have felt scared or panicky for no good reason. 0  Things have been getting on top of me. 0  I have been so unhappy that I have had difficulty sleeping. 0  I have felt  sad or miserable. 0  I have been so unhappy that I have been crying. 0  The thought of harming myself has occurred to me. 0  Edinburgh Postnatal Depression Scale Total 0     After visit meds:  Allergies as of 05/23/2020   No Known Allergies     Medication List    STOP taking these medications   Blood Pressure Kit [place] Supp Sm Misc     TAKE these medications   ibuprofen 600 MG tablet Commonly known as: ADVIL Take 1 tablet (600 mg total) by mouth every 6 (six) hours.   levothyroxine 25 MCG tablet Commonly known as: Euthyrox Take 1 tablet (25 mcg total) by mouth daily before breakfast.        Discharge home in stable condition Infant Feeding: Breast Infant Disposition:home with mother Discharge instruction: per After Visit Summary and Postpartum booklet. Activity: Advance as  tolerated. Pelvic rest for 6 weeks.  Diet: routine diet Future Appointments: Future Appointments  Date Time Provider [place]  06/23/2020 10:15 AM Left[place]-Kirby, Kathie Dike, CNM [facility] None   Follow up Visit:  [place] Follow up on 06/23/2020.   Specialty: Obstetrics and Gynecology Why: for postpartum checkup Contact information: [address] [place] [phone]              Message sent to [facility] on 05/21/20:  Please schedule this patient for a In person postpartum visit in 4 weeks with the following provider: APP. Additional Postpartum F/U:none  Low risk pregnancy complicated by: none Delivery mode:  Vaginal, Spontaneous  Anticipated Birth Control:  Unsure   05/23/2020 Christin Fudge, CNM

## 2020-05-22 ENCOUNTER — Other Ambulatory Visit: Payer: Self-pay

## 2020-05-22 NOTE — Lactation Note (Signed)
This note was copied from a baby's chart. Lactation Consultation Note  Patient Name: Emma Robbins [id number] Date: 05/22/2020 Reason for consult: Mother's request;Difficult latch;Follow-up assessment;1st time breastfeeding;Term Age:30 hours  LC entered the room, mom wanted assistance with latching infant at the breast, mom latched infant on her left breast using the cross cradle hold position, infant latched with depth and sustained latch.  Per mom, she felt a tug and no pain with latch. Mom was doing STS with infant when [place] left the room. LC reviewed hand expression and infant was given 2 mls of colostrum by spoon. Mom will continue to breastfeed infant according to cues, 8 to 12 or more times within 24 hours, STS. Mom knows to call RN or LC if she needs further assistance with latch.  Maternal Data     Feeding Mother's Current Feeding Choice: Breast Milk  LATCH Score Latch: Grasps breast easily, tongue down, lips flanged, rhythmical sucking.  Audible Swallowing: Spontaneous and intermittent  Type of Nipple: Everted at rest and after stimulation  Comfort (Breast/Nipple): Soft / non-tender  Hold (Positioning): Assistance needed to correctly position infant at breast and maintain latch.  LATCH Score: 9   Lactation Tools Discussed/Used    Interventions Interventions: Hand express;Pre-pump if needed;Breast compression;Adjust position;Support pillows;Position options;Expressed milk;Assisted with latch;Skin to skin;Education  Discharge    Consult Status Consult Status: Follow-up Date: 05/22/20 Follow-up type: In-patient    [name] 05/22/2020, 2:23 AM

## 2020-05-22 NOTE — Discharge Instructions (Signed)

## 2020-05-22 NOTE — Progress Notes (Signed)
Post Partum Day #1 Subjective: no complaints, up ad lib and tolerating PO; breastfeeding going well; desires POPs for postpartum contraception; would like to be discharged today, but that isn't likely due to inadequate GBS ppx due to rapid labor progression  Objective: Blood pressure 93/62, pulse 82, temperature 98 F (36.7 C), temperature source Oral, resp. rate 18, height 5\' 4"  (1.626 m), weight 97.6 kg, last menstrual period 08/06/2019, SpO2 99 %, unknown if currently breastfeeding.  Physical Exam:  General: alert, cooperative and no distress Lochia: appropriate Uterine Fundus: firm DVT Evaluation: No evidence of DVT seen on physical exam.  Recent Labs    05/21/20 0614  HGB 12.9  HCT 37.9    Assessment/Plan: Plan for discharge tomorrow   LOS: 1 day   [place] 05/22/2020, 7:50 AM

## 2020-05-23 MED ORDER — IBUPROFEN 600 MG PO TABS: 600.0000 mg | ORAL_TABLET | Freq: Four times a day (QID) | ORAL | 0 refills | Status: DC

## 2020-05-23 NOTE — Lactation Note (Signed)
This note was copied from a baby's chart. Lactation Consultation Note  Patient Name: Girl Emmalee Solivan [id number]'[id number] Date: 05/23/2020 Reason for consult: Follow-up assessment;Term;Primapara;1st time breastfeeding Age:30 hours   P1 mother whose infant is now 79 hours old.  This is a term baby at 40+2 weeks.  Mother has been breast feeding and supplementing with donor milk.  Baby was asleep at mother's side when I arrived.  Mother reported she is continuing to work on lactation.  She had a questions regarding her breasts feeling fuller.  Upon assessment, her breasts are heavier and fuller.  Discussed milk "coming to volume" and engorgement prevention/treatment.  Demonstrated the manual pump for mother.  Reassurance provided with breast feeding.  Mother is a [facility] participant and does not have a DEBP for home use.  Suggested she call the [facility] office early this morning to determine eligibility for a pump.  If she can obtain a DEBP from [facility] she will need a breast pump kit from the hospital.  Mother will follow through and let me know if she needs a kit prior to discharge.  No support person present at this time.  RN updated.   Maternal Data Has patient been taught Hand Expression?: Yes Does the patient have breastfeeding experience prior to this delivery?: No  Feeding Mother's Current Feeding Choice: Breast Milk and Donor Milk  LATCH Score                    Lactation Tools Discussed/Used    Interventions Interventions: Education  Discharge Discharge Education: Engorgement and breast care Pump: Personal (Plans to contact [facility] this morning regarding a DEBP) WIC Program: Yes  Consult Status Consult Status: Complete Date: 05/23/20 Follow-up type: Call as needed    Beth R DelFava 05/23/2020, 8:08 AM

## 2020-05-24 ENCOUNTER — Other Ambulatory Visit (HOSPITAL_COMMUNITY): Payer: Medicaid Other

## 2020-05-26 ENCOUNTER — Inpatient Hospital Stay (HOSPITAL_COMMUNITY)
Admission: AD | Admit: 2020-05-26 | Payer: Medicaid Other | Source: Home / Self Care | Admitting: Obstetrics and Gynecology

## 2020-05-26 ENCOUNTER — Inpatient Hospital Stay (HOSPITAL_COMMUNITY): Payer: Medicaid Other

## 2020-05-28 ENCOUNTER — Other Ambulatory Visit: Payer: Self-pay | Admitting: Advanced Practice Midwife

## 2020-06-23 ENCOUNTER — Encounter: Payer: Self-pay | Admitting: Advanced Practice Midwife

## 2020-06-23 ENCOUNTER — Other Ambulatory Visit: Payer: Self-pay

## 2020-06-23 ENCOUNTER — Ambulatory Visit (INDEPENDENT_AMBULATORY_CARE_PROVIDER_SITE_OTHER): Payer: Medicaid Other | Admitting: Advanced Practice Midwife

## 2020-06-23 VITALS — BP 109/69 | HR 81 | Wt 194.0 lb

## 2020-06-23 DIAGNOSIS — R42 Dizziness and giddiness: Secondary | ICD-10-CM | POA: Diagnosis not present

## 2020-06-23 DIAGNOSIS — Z3009 Encounter for other general counseling and advice on contraception: Secondary | ICD-10-CM | POA: Diagnosis not present

## 2020-06-23 NOTE — Progress Notes (Signed)
[place] Partum Visit Note  Emma Robbins is a 30 y.o. G4P1001 female who presents for a postpartum visit. She is 4 weeks postpartum following a normal spontaneous vaginal delivery.  I have fully reviewed the prenatal and intrapartum course. The delivery was at 40 gestational weeks.  Anesthesia: none. Postpartum course has been Unremarkable. Baby is doing well. Baby is feeding by breast. Bleeding staining only. Bowel function is normal. Bladder function is normal. Patient is sexually active. Contraception method is none. Postpartum depression screening: negative.EPDS= 4     The pregnancy intention screening data noted above was reviewed. Potential methods of contraception were discussed. The patient elected to proceed with Hormonal Implant.    Edinburgh Postnatal Depression Scale - 06/23/20 1026      Edinburgh Postnatal Depression Scale:  In the Past 7 Days   I have been able to laugh and see the funny side of things. 0    I have looked forward with enjoyment to things. 0    I have blamed myself unnecessarily when things went wrong. 0    I have been anxious or worried for no good reason. 2    I have felt scared or panicky for no good reason. 1    Things have been getting on top of me. 1    I have been so unhappy that I have had difficulty sleeping. 0    I have felt sad or miserable. 0    I have been so unhappy that I have been crying. 0    The thought of harming myself has occurred to me. 0    Edinburgh Postnatal Depression Scale Total 4            The following portions of the patient's history were reviewed and updated as appropriate: allergies, current medications, past family history, past medical history, past social history, past surgical history and problem list.  Review of Systems Pertinent items noted in HPI and remainder of comprehensive ROS otherwise negative.    Objective:  BP 109/69   Pulse 81   Wt 194 lb (88 kg)   LMP 08/06/2019   Breastfeeding Yes   BMI 33.30  kg/m    VS reviewed, nursing note reviewed,  Constitutional: well developed, well nourished, no distress HEENT: normocephalic CV: normal rate HEART: normal rate, heart sounds, regular rhythm RESP: normal effort, lung sounds clear and equal bilaterally  Abdomen: soft Neuro: alert and oriented x 3 Skin: warm, dry Psych: affect normal   Assessment:   1. Dizziness --Heart and lung sounds wnl --Pt with light lochia continuing, will check labs today --F/u on bleeding/dizziness in 2 weeks if not improved --Increase PO fuids. Pt eats little sodium, so increase sodium intake slightly. - CBC - Comp Met (CMET)  2. Postpartum examination following vaginal delivery --Doing well overall.  Bonding well with baby, good support at home. --Still having lochia, stopped 2 days then resumed light red bleeding.  Likely wnl and either end of lochia or beginning of menses. Will reevaluate at visit in 2 weeks.  3. Encounter for general counseling and advice on contraceptive management --scussed LARCs as most effective forms of birth control.  Discussed benefits/risks of other methods.  Pt desires Nexplanon in 2 weeks.   Plan:   Essential components of care per ACOG recommendations:  1.  Mood and well being: Patient with negative depression screening today. Reviewed local resources for support.  - Patient does not use tobacco. - hx of drug use? No  2. Infant care and feeding:  -Patient currently breastmilk feeding? Yes  --If breastmilk feeding discussed return to work and pumping. If needed, patient was provided letter for work to allow for every 2-3 hr pumping breaks, and to be granted a private location to express breastmilk and refrigerated area to store breastmilk. Reviewed importance of draining breast regularly to support lactation. -Social determinants of health (SDOH) reviewed in EPIC. No concerns  3. Sexuality, contraception and birth spacing - Patient does not want a pregnancy in the  next year.   - Reviewed forms of contraception in tiered fashion. Patient desired Nexplanon today.   - Discussed birth spacing of 18 months  4. Sleep and fatigue -Encouraged family/partner/community support of 4 hrs of uninterrupted sleep to help with mood and fatigue  5. Physical Recovery  - Discussed patients delivery in waterbirth tub without complications - Patient had a first degree laceration, perineal healing reviewed. Patient expressed understanding - Patient has urinary incontinence? No  - Patient is safe to resume physical and sexual activity  6.  Health Maintenance - Last pap smear done 10/12/2021and was normal with negative HPV.   [name], [place] for [employer], [place]

## 2020-06-23 NOTE — Patient Instructions (Signed)

## 2020-06-24 LAB — CBC
Hematocrit: 41 % (ref 34.0–46.6)
Hemoglobin: 13.7 g/dL (ref 11.1–15.9)
MCH: 31.4 pg (ref 26.6–33.0)
MCHC: 33.4 g/dL (ref 31.5–35.7)
MCV: 94 fL (ref 79–97)
Platelets: 253 10*3/uL (ref 150–450)
RBC: 4.37 x10E6/uL (ref 3.77–5.28)
RDW: 12.5 % (ref 11.7–15.4)
WBC: 5.8 10*3/uL (ref 3.4–10.8)

## 2020-06-24 LAB — COMPREHENSIVE METABOLIC PANEL
ALT: 29 IU/L (ref 0–32)
AST: 28 IU/L (ref 0–40)
Albumin/Globulin Ratio: 2.2 (ref 1.2–2.2)
Albumin: 4.6 g/dL (ref 3.9–5.0)
Alkaline Phosphatase: 104 IU/L (ref 44–121)
BUN/Creatinine Ratio: 14 (ref 9–23)
BUN: 12 mg/dL (ref 6–20)
Bilirubin Total: 0.3 mg/dL (ref 0.0–1.2)
CO2: 25 mmol/L (ref 20–29)
Calcium: 9.7 mg/dL (ref 8.7–10.2)
Chloride: 101 mmol/L (ref 96–106)
Creatinine, Ser: 0.83 mg/dL (ref 0.57–1.00)
Globulin, Total: 2.1 g/dL (ref 1.5–4.5)
Glucose: 56 mg/dL — ABNORMAL LOW (ref 65–99)
Potassium: 4.5 mmol/L (ref 3.5–5.2)
Sodium: 139 mmol/L (ref 134–144)
Total Protein: 6.7 g/dL (ref 6.0–8.5)
eGFR: 98 mL/min/{1.73_m2} (ref >59)

## 2020-07-07 ENCOUNTER — Ambulatory Visit: Payer: Medicaid Other | Admitting: Advanced Practice Midwife

## 2020-09-02 ENCOUNTER — Other Ambulatory Visit: Payer: Self-pay

## 2020-09-02 ENCOUNTER — Ambulatory Visit (INDEPENDENT_AMBULATORY_CARE_PROVIDER_SITE_OTHER): Payer: Medicaid Other | Admitting: Advanced Practice Midwife

## 2020-09-02 VITALS — Ht 64.0 in | Wt 187.8 lb

## 2020-09-02 DIAGNOSIS — Z975 Presence of (intrauterine) contraceptive device: Secondary | ICD-10-CM | POA: Insufficient documentation

## 2020-09-02 DIAGNOSIS — Z30017 Encounter for initial prescription of implantable subdermal contraceptive: Secondary | ICD-10-CM

## 2020-09-02 DIAGNOSIS — B3789 Other sites of candidiasis: Secondary | ICD-10-CM

## 2020-09-02 DIAGNOSIS — Z3202 Encounter for pregnancy test, result negative: Secondary | ICD-10-CM | POA: Diagnosis not present

## 2020-09-02 LAB — POCT URINE PREGNANCY: Preg Test, Ur: NEGATIVE

## 2020-09-02 MED ORDER — FLUCONAZOLE 150 MG PO TABS: 150.0000 mg | ORAL_TABLET | Freq: Once | ORAL | 1 refills | Status: AC

## 2020-09-02 MED ORDER — ETONOGESTREL 68 MG ~~LOC~~ IMPL
68.0000 mg | DRUG_IMPLANT | Freq: Once | SUBCUTANEOUS | Status: AC
  Administered 2020-09-02: 68 mg via SUBCUTANEOUS

## 2020-09-02 NOTE — Progress Notes (Signed)
  GYNECOLOGY PROGRESS NOTE  History:  30 y.o. G1P1001 presents to [place] office today for Nexplanon insertion and problem gyn visit. She reports pain with latch and mild redness of left nipple.  Her 72 month old has s/sx of thrush and has pediatrician appt tomorrow.  She denies h/a, dizziness, shortness of breath, n/v, or fever/chills.    The following portions of the patient's history were reviewed and updated as appropriate: allergies, current medications, past family history, past medical history, past social history, past surgical history and problem list. Last pap smear on 01/08/20 was normal.  Health Maintenance Due  Topic Date Due  . COVID-19 Vaccine (1) Never done  . TETANUS/TDAP  Never done     Review of Systems:  Pertinent items are noted in HPI.   Objective:  Physical Exam Height 5\' 4"  (1.626 m), weight 187 lb 12.8 oz (85.2 kg), currently breastfeeding. VS reviewed, nursing note reviewed,  Constitutional: well developed, well nourished, no distress HEENT: normocephalic CV: normal rate Pulm/chest wall: normal effort Breast Exam: On visual inspection, left nipple with mild erythema, more redness than right nipple, no other abnormality visualized Abdomen: soft Neuro: alert and oriented x 3 Skin: warm, dry Psych: affect normal Pelvic exam: Deferred    Nexplanon Insertion Procedure Patient identified, informed consent performed, consent signed.   Patient does understand that irregular bleeding is a very common side effect of this medication. She was advised to have backup contraception for one week after placement. Pregnancy test in clinic today was negative.  Appropriate time out taken.  Patient's left arm was prepped and draped in the usual sterile fashion.  Patient was prepped with alcohol swab and then injected with 3 ml of 1% lidocaine.  She was prepped with betadine, Nexplanon removed from packaging,  Device confirmed in needle, then inserted full length of needle and  withdrawn per handbook instructions. Nexplanon was able to palpated in the patient's arm; patient palpated the insert herself. There was minimal blood loss.  Patient insertion site covered with guaze and a pressure bandage to reduce any bruising.  The patient tolerated the procedure well and was given post procedure instructions.    Assessment & Plan:  1. Candidiasis of breast --Breast milk expressed and allowed to dry to air on breast may assist in healing --Infant treatment by pediatrician - fluconazole (DIFLUCAN) 150 MG tablet; Take 1 tablet (150 mg total) by mouth once for 1 dose.  Dispense: 1 tablet; Refill: 1  2. Nexplanon insertion --See procedure note above - POCT urine pregnancy  [name], CNM 10:54 AM

## 2020-09-02 NOTE — Patient Instructions (Signed)
Nexplanon Instructions After Insertion  Keep bandage clean and dry for 24 hours  May use ice/Tylenol/Ibuprofen for soreness or pain  If you develop fever, drainage or increased warmth from incision site-contact office immediately   

## 2020-09-02 NOTE — Addendum Note (Signed)
Addended by: Penny Pia on: 09/02/2020 01:17 PM   Modules accepted: Orders

## 2020-09-02 NOTE — Progress Notes (Signed)
LMP 07/09/20. Unprotected intercourse 08/12/20.  Reports concern for thrush infection left breast. Breastfeeding, infant has thrush.

## 2020-10-01 ENCOUNTER — Encounter: Payer: Self-pay | Admitting: Obstetrics and Gynecology

## 2020-10-01 ENCOUNTER — Ambulatory Visit (INDEPENDENT_AMBULATORY_CARE_PROVIDER_SITE_OTHER): Payer: Medicaid Other | Admitting: Obstetrics and Gynecology

## 2020-10-01 ENCOUNTER — Other Ambulatory Visit: Payer: Self-pay

## 2020-10-01 VITALS — BP 113/72 | HR 90 | Wt 187.7 lb

## 2020-10-01 DIAGNOSIS — Z3046 Encounter for surveillance of implantable subdermal contraceptive: Secondary | ICD-10-CM

## 2020-10-01 NOTE — Progress Notes (Signed)
Pt is in the office for nexplanon removal, inserted on 09-02-20. Pt reports that since insertion she has experienced twitching in her L arm and fingers, and dizziness. In addition, she reports that the nexplanon rod punctured through her skin. Declines any other BC

## 2020-10-01 NOTE — Progress Notes (Signed)
30 yo here for Nexplanon removal secondary to pain at insertion site since 08/2020. Patient plans to use condoms going forward and is not interested in any other forms of contraception.  Removal Patient given informed consent for removal of her Nexplanon, time out was performed.  Signed copy in the chart.  Appropriate time out taken. Nexplanon site identified on left arm, very superficial un der skin with some bruising on the proximal end.  Area prepped in usual sterile fashon. One cc of 1% lidocaine was used to anesthetize the area at the distal end of the implant. A small stab incision was made right beside the implant on the distal portion.  The Nexplanon rod was grasped using hemostats and removed without difficulty.  There was less than 3 cc blood loss. There were no complications.  A small amount of antibiotic ointment and steri-strips were applied over the small incision.  A pressure bandage was applied to reduce any bruising.  The patient tolerated the procedure well and was given post procedure instructions.  Patient with normal pap smear 12/2019 and declines STI testing

## 2020-10-06 ENCOUNTER — Ambulatory Visit: Payer: Medicaid Other | Admitting: Obstetrics

## 2020-10-07 ENCOUNTER — Other Ambulatory Visit: Payer: Self-pay

## 2020-10-07 ENCOUNTER — Ambulatory Visit (INDEPENDENT_AMBULATORY_CARE_PROVIDER_SITE_OTHER): Payer: Medicaid Other | Admitting: Obstetrics

## 2020-10-07 ENCOUNTER — Encounter: Payer: Self-pay | Admitting: Obstetrics

## 2020-10-07 VITALS — BP 114/74 | HR 84 | Wt 187.0 lb

## 2020-10-07 DIAGNOSIS — F53 Postpartum depression: Secondary | ICD-10-CM

## 2020-10-07 DIAGNOSIS — N61 Mastitis without abscess: Secondary | ICD-10-CM | POA: Diagnosis not present

## 2020-10-07 DIAGNOSIS — O99345 Other mental disorders complicating the puerperium: Secondary | ICD-10-CM

## 2020-10-07 MED ORDER — AMOXICILLIN-POT CLAVULANATE 875-125 MG PO TABS: 1.0000 | ORAL_TABLET | Freq: Two times a day (BID) | ORAL | 0 refills | Status: DC

## 2020-10-07 NOTE — Progress Notes (Signed)
Pt states right breast feels clogged. Pt states has had some bleeding from breast as well.  Pt has been breastfeeding x 4 months.  Pt is having some dizziness.  Had Nexplanon removed a couple weeks ago.   EPDS score 15 today, pt has hx of depression- never on meds.

## 2020-10-07 NOTE — Progress Notes (Signed)
Patient ID: Emma Robbins, female   DOB: [birth year], 30 y.o.   MRN: [id number]  No chief complaint on file.   HPI Emma Robbins is a 30 y.o. female.  Complains of discharge and bleeding from r ight breast nipple.  Denies breast pain or breast masses.  She has been breast feeding for 4 months. HPI  Past Medical History:  Diagnosis Date   Anxiety    Depression    Gallstone 11/2011   Headache(784.0)    unspecified - 2-3 x/week   Hemorrhoids    Thyroid disease     Past Surgical History:  Procedure Laterality Date   BREAST SURGERY  2012   Fibroadenoma   CHOLECYSTECTOMY  12/09/2011   Procedure: LAPAROSCOPIC CHOLECYSTECTOMY;  Surgeon: Haywood Lasso, MD;  Location: [place];  Service: General;  Laterality: N/A;   LAPAROSCOPIC OVARIAN CYSTECTOMY  12/16/10   Right cystadenofibroma    Family History  Problem Relation Age of Onset   Hyperlipidemia Father    Thyroid disease Father    Diabetes Father    Thyroid disease Brother    Thyroid disease Sister    Hypertension Maternal Grandmother    Diabetes Paternal Grandmother    Hyperlipidemia Paternal Grandmother    Cancer Paternal Grandfather        Prostate    Social History Social History   Tobacco Use   Smoking status: Never   Smokeless tobacco: Never  Vaping Use   Vaping Use: Never used  Substance Use Topics   Alcohol use: Not Currently    Alcohol/week: 3.0 standard drinks    Types: 3 Standard drinks or equivalent per week    Comment: Rare   Drug use: Not Currently    Types: Cocaine    Comment: per pt did Cocaine 2 yrs ago.05-2017    No Known Allergies  Current Outpatient Medications  Medication Sig Dispense Refill   amoxicillin-clavulanate (AUGMENTIN) 875-125 MG tablet Take 1 tablet by mouth 2 (two) times daily. 14 tablet 0   levothyroxine (EUTHYROX) 25 MCG tablet Take 1 tablet (25 mcg total) by mouth daily before breakfast. 30 tablet 11   acetaminophen (TYLENOL) 325 MG tablet Take 650 mg by  mouth every 6 (six) hours as needed. (Patient not taking: Reported on 10/01/2020)     ibuprofen (ADVIL) 600 MG tablet TAKE 1 TABLET(600 MG) BY MOUTH EVERY 6 HOURS (Patient not taking: No sig reported) 60 tablet 1   Prenatal Vit-Fe Fumarate-FA (PRENATAL MULTIVITAMIN) TABS tablet Take 1 tablet by mouth daily at 12 noon. (Patient not taking: Reported on 10/01/2020)     No current facility-administered medications for this visit.    Review of Systems Review of Systems Constitutional: negative for fatigue and weight loss Respiratory: negative for cough and wheezing Cardiovascular: negative for chest pain, fatigue and palpitations Gastrointestinal: negative for abdominal pain and change in bowel habits Genitourinary:negative Integument/breast: positive for nipple discharge Musculoskeletal:negative for myalgias Neurological: negative for gait problems and tremors Behavioral/Psych: negative for abusive relationship, depression Endocrine: negative for temperature intolerance      Blood pressure 114/74, pulse 84, weight 187 lb (84.8 kg), currently breastfeeding.  Physical Exam Physical Exam General:   Alert and no distress  Skin:   no rash or abnormalities  Lungs:   clear to auscultation bilaterally  Heart:   regular rate and rhythm, S1, S2 normal, no murmur, click, rub or gallop  Breasts:   Right breast:  pus expressed from nipple.  Periareolar induration, ``````````````````````````````warmth and tenderness.  Pus  in nipple duct.    I have spent a total of 15 minutes of face-to-face time, excluding clinical staff time, reviewing notes and preparing to see patient, ordering tests and/or medications, and counseling the patient.   Data Reviewed Labs  Assessment     1. Mastitis, acute Rx: - Ambulatory referral to Lactation - amoxicillin-clavulanate (AUGMENTIN) 875-125 MG tablet; Take 1 tablet by mouth 2 (two) times daily.  Dispense: 14 tablet; Refill: 0  2. Postpartum depression Rx: -  Ambulatory referral to [place]   Follow up in 2 weeks.  Orders Placed This Encounter  Procedures   Ambulatory referral to [place]    Referral Priority:   Routine    Referral Type:   Consultation    Referral Reason:   Specialty Services Required    Number of Visits Requested:   1   Ambulatory referral to Lactation    Referral Priority:   Routine    Referral Type:   Consultation    Referral Reason:   Specialty Services Required    Requested Specialty:   Lactation Services    Number of Visits Requested:   1   Meds ordered this encounter  Medications   amoxicillin-clavulanate (AUGMENTIN) 875-125 MG tablet    Sig: Take 1 tablet by mouth 2 (two) times daily.    Dispense:  14 tablet    Refill:  0       Shelly Bombard, MD 10/07/2020 4:51 PM

## 2020-10-14 ENCOUNTER — Encounter: Payer: Medicaid Other | Admitting: Licensed Clinical Social Worker

## 2020-10-14 ENCOUNTER — Telehealth: Payer: Self-pay | Admitting: Licensed Clinical Social Worker

## 2020-10-14 NOTE — Telephone Encounter (Signed)
Link for [employer] video sent to pt phone number listed in file.

## 2020-10-14 NOTE — Telephone Encounter (Signed)
Called pt regarding scheduled mychart visit. Left message for callback

## 2020-10-21 ENCOUNTER — Encounter: Payer: Medicaid Other | Admitting: Licensed Clinical Social Worker

## 2020-10-22 ENCOUNTER — Ambulatory Visit (INDEPENDENT_AMBULATORY_CARE_PROVIDER_SITE_OTHER): Payer: Medicaid Other | Admitting: Obstetrics

## 2020-10-22 ENCOUNTER — Other Ambulatory Visit (HOSPITAL_COMMUNITY)
Admission: RE | Admit: 2020-10-22 | Discharge: 2020-10-22 | Disposition: A | Discharge status: Home / Self Care | Payer: Medicaid Other | Source: Ambulatory Visit | Attending: Obstetrics | Admitting: Obstetrics

## 2020-10-22 ENCOUNTER — Other Ambulatory Visit: Payer: Self-pay

## 2020-10-22 ENCOUNTER — Encounter: Payer: Self-pay | Admitting: Obstetrics

## 2020-10-22 DIAGNOSIS — B373 Candidiasis of vulva and vagina: Secondary | ICD-10-CM

## 2020-10-22 DIAGNOSIS — N898 Other specified noninflammatory disorders of vagina: Secondary | ICD-10-CM

## 2020-10-22 DIAGNOSIS — B3731 Acute candidiasis of vulva and vagina: Secondary | ICD-10-CM

## 2020-10-22 DIAGNOSIS — N61 Mastitis without abscess: Secondary | ICD-10-CM | POA: Diagnosis not present

## 2020-10-22 MED ORDER — TERCONAZOLE 0.8 % VA CREA: 1.0000 | TOPICAL_CREAM | Freq: Every day | VAGINAL | 0 refills | Status: DC

## 2020-10-22 NOTE — Progress Notes (Signed)
Pt presents to follow up R breast pain.  Pt did not complete full course of ABx.  Pt also c/o yeast infection.Marland Kitchen

## 2020-10-22 NOTE — Progress Notes (Deleted)
Subjective:     Emma Robbins is a 30 y.o. female who presents for a postpartum visit. She is {1-10:13787} {time; units:18646} postpartum following a {delivery:12449}. I have fully reviewed the prenatal and intrapartum course. The delivery was at *** gestational weeks. Outcome: {delivery outcome:32078}. Anesthesia: {anesthesia types:812}. Postpartum course has been ***. Baby's course has been ***. Baby is feeding by {breast/bottle:69}. Bleeding {vag bleed:12292}. Bowel function is {normal:32111}. Bladder function is {normal:32111}. Patient {is/is not:9024} sexually active. Contraception method is {contraceptive method:5051}. Postpartum depression screening: {neg default:13464::"negative"}.  Tobacco, alcohol and substance abuse history reviewed.  Adult immunizations reviewed including TDAP, rubella and varicella.  {Common ambulatory SmartLinks:19316}  Review of Systems {ros; complete:30496}   Objective:    BP 105/72   Pulse 81   Wt 187 lb 14.4 oz (85.2 kg)   LMP 09/12/2020   Breastfeeding Yes   BMI 32.25 kg/m   General:  {gen appearance:16600}   Breasts:  {breast exam:1202::"inspection negative, no nipple discharge or bleeding, no masses or nodularity palpable"}  Lungs: {lung exam:16931}  Heart:  {heart exam:5510}  Abdomen: {abdomen exam:16834}   Vulva:  {labia exam:12198}  Vagina: {vagina exam:12200}  Cervix:  {cervix exam:14595}  Corpus: {uterus exam:12215}  Adnexa:  {adnexa exam:12223}  Rectal Exam: {rectal/vaginal exam:12274}          ***% of *** min visit spent on counseling and coordination of care.  Assessment:    *** postpartum exam. Pap smear {done:10129} at today's visit.  Plan:    1. Contraception: {method:5051} 2. *** 3. Follow up in: {1-10:13787} {time; units:19136} or as needed.  2hr GTT for h/o GDM/screening for DM q 3 yrs per ADA recommendations Preconception counseling provided Healthy lifestyle practices reviewed

## 2020-10-22 NOTE — Progress Notes (Signed)
Patient ID: Emma Robbins, female   DOB: 1990-05-14, 30 y.o.   MRN: [id number]  Chief Complaint  Patient presents with   Follow-up    HPI Emma Robbins is a 30 y.o. female.  History of mastitis right breast, treated with Augmentin.  She is breast feeding, and also c/o sore and cracked nipples.  Has vaginal discharge with itching after starting Augmentin. HPI  Past Medical History:  Diagnosis Date   Anxiety    Depression    Gallstone 11/2011   Headache(784.0)    unspecified - 2-3 x/week   Hemorrhoids    Thyroid disease     Past Surgical History:  Procedure Laterality Date   BREAST SURGERY  2012   Fibroadenoma   CHOLECYSTECTOMY  12/09/2011   Procedure: LAPAROSCOPIC CHOLECYSTECTOMY;  Surgeon: Haywood Lasso, MD;  Location: [place];  Service: General;  Laterality: N/A;   LAPAROSCOPIC OVARIAN CYSTECTOMY  12/16/10   Right cystadenofibroma    Family History  Problem Relation Age of Onset   Hyperlipidemia Father    Thyroid disease Father    Diabetes Father    Thyroid disease Brother    Thyroid disease Sister    Hypertension Maternal Grandmother    Diabetes Paternal Grandmother    Hyperlipidemia Paternal Grandmother    Cancer Paternal Grandfather        Prostate    Social History Social History   Tobacco Use   Smoking status: Never   Smokeless tobacco: Never  Vaping Use   Vaping Use: Never used  Substance Use Topics   Alcohol use: Not Currently    Alcohol/week: 3.0 standard drinks    Types: 3 Standard drinks or equivalent per week    Comment: Rare   Drug use: Not Currently    Types: Cocaine    Comment: per pt did Cocaine 2 yrs ago.05-2017    No Known Allergies  Current Outpatient Medications  Medication Sig Dispense Refill   amoxicillin-clavulanate (AUGMENTIN) 875-125 MG tablet Take 1 tablet by mouth 2 (two) times daily. 14 tablet 0   levothyroxine (EUTHYROX) 25 MCG tablet Take 1 tablet (25 mcg total) by mouth daily before breakfast. 30  tablet 11   terconazole (TERAZOL 3) 0.8 % vaginal cream Place 1 applicator vaginally at bedtime. 20 g 0   acetaminophen (TYLENOL) 325 MG tablet Take 650 mg by mouth every 6 (six) hours as needed. (Patient not taking: No sig reported)     ibuprofen (ADVIL) 600 MG tablet TAKE 1 TABLET(600 MG) BY MOUTH EVERY 6 HOURS (Patient not taking: No sig reported) 60 tablet 1   Prenatal Vit-Fe Fumarate-FA (PRENATAL MULTIVITAMIN) TABS tablet Take 1 tablet by mouth daily at 12 noon. (Patient not taking: No sig reported)     No current facility-administered medications for this visit.    Review of Systems Review of Systems Constitutional: negative for fatigue and weight loss Respiratory: negative for cough and wheezing Cardiovascular: negative for chest pain, fatigue and palpitations Gastrointestinal: negative for abdominal pain and change in bowel habits Genitourinary:negative Integument/breast: positive for breast pain and sore, cracked nipples.  negative for nipple discharge Musculoskeletal:negative for myalgias Neurological: negative for gait problems and tremors Behavioral/Psych: negative for abusive relationship, depression Endocrine: negative for temperature intolerance      Blood pressure 105/72, pulse 81, weight 187 lb 14.4 oz (85.2 kg), last menstrual period 09/12/2020, currently breastfeeding.  Physical Exam Physical Exam General:   Alert and no distress  Skin:   no rash or abnormalities  Lungs:  clear to auscultation bilaterally  Heart:   regular rate and rhythm, S1, S2 normal, no murmur, click, rub or gallop  Breasts:   normal without suspicious masses, skin or nipple changes or axillary nodes    I have spent a total of 20 minutes of face-to-face time, excluding clinical staff time, reviewing notes and preparing to see patient, ordering tests and/or medications, and counseling the patient.   Data Reviewed Labs  Assessment     1. Postpartum examination following vaginal  delivery  2. Mastitis - Augmentin Rx - All Purpose Nipple Cream Rx  .# 30 grams.  Sig:  Apply after breast feeding  3. Vaginal discharge Rx: - Cervicovaginal ancillary only( [facility])  4. Candida vaginitis Rx: - terconazole (TERAZOL 3) 0.8 % vaginal cream; Place 1 applicator vaginally at bedtime.  Dispense: 20 g; Refill: 0     Plan   Continue breast feeding   Follow up in 2 weeks   Meds ordered this encounter  Medications   terconazole (TERAZOL 3) 0.8 % vaginal cream    Sig: Place 1 applicator vaginally at bedtime.    Dispense:  20 g    Refill:  0    Shelly Bombard, MD 10/22/2020 12:42 PM

## 2020-10-24 ENCOUNTER — Other Ambulatory Visit: Payer: Self-pay | Admitting: Obstetrics

## 2020-10-24 DIAGNOSIS — N760 Acute vaginitis: Secondary | ICD-10-CM

## 2020-10-24 LAB — CERVICOVAGINAL ANCILLARY ONLY
Bacterial Vaginitis (gardnerella): POSITIVE — AB
Candida Glabrata: NEGATIVE
Candida Vaginitis: POSITIVE — AB
Comment: NEGATIVE
Comment: NEGATIVE
Comment: NEGATIVE
Comment: NEGATIVE
Trichomonas: NEGATIVE

## 2020-10-24 MED ORDER — METRONIDAZOLE 500 MG PO TABS: 500.0000 mg | ORAL_TABLET | Freq: Two times a day (BID) | ORAL | 2 refills | Status: DC

## 2020-10-27 ENCOUNTER — Other Ambulatory Visit: Payer: Self-pay

## 2020-10-27 NOTE — Telephone Encounter (Signed)
Patient reviewed test results online.

## 2020-10-27 NOTE — Telephone Encounter (Signed)
-----   Message from Shelly Bombard, MD sent at 10/24/2020 11:58 AM EDT ----- Flagyl Rx for BV Terazol Rx for yeast

## 2020-11-05 ENCOUNTER — Ambulatory Visit: Payer: Medicaid Other | Admitting: Obstetrics

## 2020-11-20 ENCOUNTER — Ambulatory Visit (INDEPENDENT_AMBULATORY_CARE_PROVIDER_SITE_OTHER): Payer: Medicaid Other

## 2020-11-20 ENCOUNTER — Other Ambulatory Visit: Payer: Self-pay

## 2020-11-20 DIAGNOSIS — Z3200 Encounter for pregnancy test, result unknown: Secondary | ICD-10-CM | POA: Diagnosis not present

## 2020-11-20 LAB — POCT URINE PREGNANCY: Preg Test, Ur: POSITIVE — AB

## 2020-11-20 NOTE — Progress Notes (Signed)
Patient was assessed and managed by nursing staff during this encounter. I have reviewed the chart and agree with the documentation and plan. I have also made any necessary editorial changes.  Griffin Basil, MD 11/20/2020 3:42 PM

## 2020-11-20 NOTE — Progress Notes (Signed)
..  Emma Robbins presents today for UPT. She has no unusual complaints. LMP:10-16-20    OBJECTIVE: Appears well, in no apparent distress.  OB History     Gravida  2   Para  1   Term  1   Preterm      AB      Living  1      SAB      IAB      Ectopic      Multiple  0   Live Births  1          Home UPT Result:Positive In-Office UPT result:Positive I have reviewed the patient's medical, obstetrical, social, and family histories, and medications.   ASSESSMENT: Positive pregnancy test  PLAN Prenatal care to be completed at: [facility]

## 2020-11-21 ENCOUNTER — Inpatient Hospital Stay (HOSPITAL_COMMUNITY): Payer: Medicaid Other

## 2020-11-21 ENCOUNTER — Other Ambulatory Visit: Payer: Self-pay

## 2020-11-21 ENCOUNTER — Inpatient Hospital Stay (HOSPITAL_COMMUNITY)
Admission: AD | Admit: 2020-11-21 | Discharge: 2020-11-21 | Disposition: A | Discharge status: Home / Self Care | Payer: Medicaid Other | Attending: Obstetrics & Gynecology | Admitting: Obstetrics & Gynecology

## 2020-11-21 ENCOUNTER — Telehealth: Payer: Self-pay

## 2020-11-21 DIAGNOSIS — O4690 Antepartum hemorrhage, unspecified, unspecified trimester: Secondary | ICD-10-CM | POA: Diagnosis not present

## 2020-11-21 DIAGNOSIS — O209 Hemorrhage in early pregnancy, unspecified: Secondary | ICD-10-CM

## 2020-11-21 DIAGNOSIS — Z3A00 Weeks of gestation of pregnancy not specified: Secondary | ICD-10-CM | POA: Diagnosis not present

## 2020-11-21 DIAGNOSIS — O3680X0 Pregnancy with inconclusive fetal viability, not applicable or unspecified: Secondary | ICD-10-CM | POA: Insufficient documentation

## 2020-11-21 LAB — CBC
HCT: 41 % (ref 36.0–46.0)
Hemoglobin: 13.8 g/dL (ref 12.0–15.0)
MCH: 32.1 pg (ref 26.0–34.0)
MCHC: 33.7 g/dL (ref 30.0–36.0)
MCV: 95.3 fL (ref 80.0–100.0)
Platelets: 268 10*3/uL (ref 150–400)
RBC: 4.3 MIL/uL (ref 3.87–5.11)
RDW: 12.9 % (ref 11.5–15.5)
WBC: 8.2 10*3/uL (ref 4.0–10.5)
nRBC: 0 % (ref 0.0–0.2)

## 2020-11-21 LAB — ABO/RH: ABO/RH(D): O POS

## 2020-11-21 LAB — URINALYSIS, ROUTINE W REFLEX MICROSCOPIC
Bacteria, UA: NONE SEEN
Bilirubin Urine: NEGATIVE
Glucose, UA: NEGATIVE mg/dL
Ketones, ur: NEGATIVE mg/dL
Nitrite: NEGATIVE
Protein, ur: 30 mg/dL — AB
RBC / HPF: >50 RBC/hpf — ABNORMAL HIGH (ref 0–5)
Specific Gravity, Urine: 1.021 (ref 1.005–1.030)
pH: 5 (ref 5.0–8.0)

## 2020-11-21 LAB — WET PREP, GENITAL
Sperm: NONE SEEN
Trich, Wet Prep: NONE SEEN
Yeast Wet Prep HPF POC: NONE SEEN

## 2020-11-21 LAB — HCG, QUANTITATIVE, PREGNANCY: hCG, Beta Chain, Quant, S: 81 m[IU]/mL — ABNORMAL HIGH (ref <5)

## 2020-11-21 NOTE — MAU Note (Addendum)
Presents with c/o VB that began today.  Reports bleeding started as spotting but now passing clots and bleeding is heavier.  UPT confirmed yesterday @ [place].  Reports unsure of LMP, had Nexplanon removed last month.

## 2020-11-21 NOTE — Telephone Encounter (Signed)
Return call to pt regarding triage vm  +UPT yesterday  Pt is 5wks noticing bleeding today. No recent intercourse, no pain  Pt advised to monitor, if large clots, increased in pain to go to hospital for evaluation Pt advised to avoid any intercourse at this time.  Pt agreeable and voiced understanding.

## 2020-11-21 NOTE — MAU Provider Note (Signed)
Patient Emma Robbins is a 30 y.o. G2P1001  At 34w1dhere with two home positive pregnancy tests yesterday and then started having bleeding today. She denies abdominal pain or other unusual discharge. She reports bleeding with clots but has only been using liners today. She denies any UTI symptoms; reports occasional vomiting but has also not eaten today. She denies any history of ectopic pregnancy in the past. She denies fever, chills, SOB, constipation, diarrhea.   History     CSN: [id number] Arrival date and time: 11/21/20 1504   None     Chief Complaint  Patient presents with   Vaginal Bleeding   Vaginal Bleeding The patient's primary symptoms include vaginal bleeding. The current episode started today. The problem occurs intermittently. Pertinent negatives include no back pain, chills, constipation, diarrhea, dysuria or fever. The vaginal discharge was bloody. The vaginal bleeding is typical of menses. She has been passing clots. She has not been passing tissue.  She denies any abdominal pain.   OB History     Gravida  2   Para  1   Term  1   Preterm      AB      Living  1      SAB      IAB      Ectopic      Multiple  0   Live Births  1           Past Medical History:  Diagnosis Date   Anxiety    Depression    Gallstone 11/2011   Headache(784.0)    unspecified - 2-3 x/week   Hemorrhoids    Thyroid disease     Past Surgical History:  Procedure Laterality Date   BREAST SURGERY  2012   Fibroadenoma   CHOLECYSTECTOMY  12/09/2011   Procedure: LAPAROSCOPIC CHOLECYSTECTOMY;  Surgeon: CHaywood Lasso MD;  [place]  Service: General;  Laterality: N/A;   LAPAROSCOPIC OVARIAN CYSTECTOMY  12/16/10   Right cystadenofibroma    Family History  Problem Relation Age of Onset   Hyperlipidemia Father    Thyroid disease Father    Diabetes Father    Thyroid disease Brother    Thyroid disease Sister    Hypertension Maternal  Grandmother    Diabetes Paternal Grandmother    Hyperlipidemia Paternal Grandmother    Cancer Paternal Grandfather        Prostate    Social History   Tobacco Use   Smoking status: Never   Smokeless tobacco: Never  Vaping Use   Vaping Use: Never used  Substance Use Topics   Alcohol use: Not Currently    Alcohol/week: 3.0 standard drinks    Types: 3 Standard drinks or equivalent per week    Comment: Rare   Drug use: Not Currently    Types: Cocaine    Comment: per pt did Cocaine 2 yrs ago.05-2017    Allergies: No Known Allergies  Medications Prior to Admission  Medication Sig Dispense Refill Last Dose   acetaminophen (TYLENOL) 325 MG tablet Take 650 mg by mouth every 6 (six) hours as needed. (Patient not taking: No sig reported)      amoxicillin-clavulanate (AUGMENTIN) 875-125 MG tablet Take 1 tablet by mouth 2 (two) times daily. (Patient not taking: Reported on 11/20/2020) 14 tablet 0    ibuprofen (ADVIL) 600 MG tablet TAKE 1 TABLET(600 MG) BY MOUTH EVERY 6 HOURS (Patient not taking: No sig reported) 60 tablet 1    levothyroxine (  EUTHYROX) 25 MCG tablet Take 1 tablet (25 mcg total) by mouth daily before breakfast. 30 tablet 11    metroNIDAZOLE (FLAGYL) 500 MG tablet Take 1 tablet (500 mg total) by mouth 2 (two) times daily. (Patient not taking: Reported on 11/20/2020) 14 tablet 2    Prenatal Vit-Fe Fumarate-FA (PRENATAL MULTIVITAMIN) TABS tablet Take 1 tablet by mouth daily at 12 noon. (Patient not taking: No sig reported)      terconazole (TERAZOL 3) 0.8 % vaginal cream Place 1 applicator vaginally at bedtime. (Patient not taking: Reported on 11/20/2020) 20 g 0     Review of Systems  Constitutional: Negative.  Negative for chills and fever.  HENT: Negative.    Respiratory: Negative.    Cardiovascular: Negative.   Gastrointestinal: Negative.  Negative for constipation and diarrhea.  Genitourinary:  Positive for vaginal bleeding. Negative for dysuria.  Musculoskeletal:  Negative  for back pain.  Neurological: Negative.   Physical Exam   Blood pressure 131/78, pulse 85, temperature 97.6 F (36.4 C), temperature source Oral, resp. rate 18, height '5\' 2"'$  (1.575 m), weight 86.7 kg, last menstrual period 10/16/2020, SpO2 100 %, currently breastfeeding.  Physical Exam Constitutional:      Appearance: Normal appearance.  Pulmonary:     Effort: Pulmonary effort is normal.  Abdominal:     General: Abdomen is flat.  Musculoskeletal:        General: Normal range of motion.  Skin:    General: Skin is warm.  Neurological:     General: No focal deficit present.     Mental Status: She is alert.  Psychiatric:        Mood and Affect: Mood normal.   MAU Course  Procedures  MDM -due to high acuity census in MAU, patient deferred pelvic and speculum exam to evaluate bleeding.  -Beta today is 90 -US shows nothing in uterus; I have independently reviewed the [place] images, which reveal finding of no intrauterine pregnancy.    Assessment and Plan   1. Pregnancy of unknown anatomic location   2. Vaginal bleeding affecting early pregnancy    -patient to return Sunday night for BHCG. She is unable to come at another time due to childcare issues.  -strict ectopic precautions given, including when to return to MAU.  -Patient informed that at this time the pregnancy cannot be confirmed to be in the uterus as no yolk sac is visualized on ultrasound.  Advised to return with worsening symptoms as an ectopic pregnancy cannot be ruled out at this time and this could be a life threatening condition. Pelvic rest - no tampons, no douching, no sex, nothing in the vagina. No heavy lifting or strenuous exercise. Discussed with client the diagnosis of pregnancy of unknown anatomic location.  Three possibilities of outcome are: a healthy pregnancy that is too early to see a yolk sac to confirm the pregnancy is in the uterus, a pregnancy that is not healthy and has not developed and will not  develop, and an ectopic pregnancy that is in the abdomen that cannot be identified at this time.  And ectopic pregnancy can be a life threatening situation as a pregnancy needs to be in the uterus which is a muscle and can stretch to accommodate the growth of a pregnancy.  Other structures in the pelvis and abdomen as not muscular and do not stretch with the growth of a pregnancy.  Worst case scenario is that a structure ruptures with a growing pregnancy not in the uterus  and and internal hemorrhage can be a life threatening situation.  We need to follow the progression of this pregnancy carefully.  We need to check another serum pregnancy hormone level to determine if the levels are rising appropriately  and to determine the next steps that are needed for you.  An appointment was made for follow up in 48 hours in MAU for her lab work.  Patient's questions were answered.    Mervyn Skeeters Eleana Tocco 11/21/2020, 8:11 PM

## 2020-11-21 NOTE — Discharge Instructions (Signed)
-  return if develop any strong pain in your abdomen -return if bleeding worsens -return to MAU on Sunday night, August 28 at 6 pm for follow-up bHCG

## 2020-11-23 ENCOUNTER — Other Ambulatory Visit: Payer: Self-pay

## 2020-11-23 ENCOUNTER — Inpatient Hospital Stay (HOSPITAL_COMMUNITY)
Admission: AD | Admit: 2020-11-23 | Discharge: 2020-11-23 | Disposition: A | Discharge status: Home / Self Care | Payer: Medicaid Other | Attending: Obstetrics & Gynecology | Admitting: Obstetrics & Gynecology

## 2020-11-23 DIAGNOSIS — O039 Complete or unspecified spontaneous abortion without complication: Secondary | ICD-10-CM

## 2020-11-23 DIAGNOSIS — Z3A01 Less than 8 weeks gestation of pregnancy: Secondary | ICD-10-CM | POA: Insufficient documentation

## 2020-11-23 LAB — HCG, QUANTITATIVE, PREGNANCY: hCG, Beta Chain, Quant, S: 22 m[IU]/mL — ABNORMAL HIGH (ref <5)

## 2020-11-23 NOTE — MAU Note (Signed)
Pt reports to mau for follow up lab work.  Pt denies pain at this time.  Reports some mild abd pain earlier this morning.  Pt reports bleeding has lightened to spotting now.

## 2020-11-23 NOTE — MAU Provider Note (Signed)
History   Chief Complaint:  Follow-up   Emma Robbins is  30 y.o. G2P1001 Patient's last menstrual period was 10/16/2020.Marland Kitchen Patient is here for follow up of quantitative HCG and ongoing surveillance of pregnancy status. She is 42w3dweeks gestation  by LMP.    Since her last visit, the patient is without new complaint. The patient reports bleeding as  spotting.  She denies any pain.  General ROS:  negative for abdominal pain.   Her previous Quantitative HCG values are:  Component     Latest Ref Rng & Units 11/21/2020  HCG, Beta Chain, Quant, S     <5 mIU/mL 81 (H)     Physical Exam   Blood pressure 111/74, pulse 64, temperature 98 F (36.7 C), temperature source Oral, resp. rate 16, last menstrual period 10/16/2020, SpO2 99 %, currently breastfeeding.  Physical Examination: General appearance - alert, well appearing, and in no distress Mental status - alert, oriented to person, place, and time, normal mood, behavior, speech, dress, motor activity, and thought processes Eyes - sclera anicteric Chest - normal respiratory effort  Labs: Results for orders placed or performed during the hospital encounter of 11/23/20 (from the past 24 hour(s))  hCG, quantitative, pregnancy   Collection Time: 11/23/20  6:32 PM  Result Value Ref Range   hCG, Beta Chain, Quant, S 22 (H) <5 mIU/mL    Ultrasound Studies:   No results found.  Assessment:   1. Miscarriage   2. [redacted] weeks gestation of pregnancy    -significant drop in HCG. Patient is asymptomatic. She is RH positive.    Plan: -Discharge home in stable condition -Bleedings precautions discussed -Patient advised to follow-up with [facility] - message sent to office for f/u appointments -Patient may return to MAU as needed or if her condition were to change or worsen  EJorje Guild NP 11/23/2020, 9:25 PM

## 2020-11-24 LAB — GC/CHLAMYDIA PROBE AMP (~~LOC~~) NOT AT ARMC
Chlamydia: NEGATIVE
Comment: NEGATIVE
Comment: NORMAL
Neisseria Gonorrhea: NEGATIVE

## 2020-12-02 ENCOUNTER — Ambulatory Visit: Payer: Medicaid Other

## 2020-12-08 ENCOUNTER — Ambulatory Visit (INDEPENDENT_AMBULATORY_CARE_PROVIDER_SITE_OTHER): Payer: Medicaid Other | Admitting: Advanced Practice Midwife

## 2020-12-08 ENCOUNTER — Other Ambulatory Visit: Payer: Self-pay

## 2020-12-08 VITALS — BP 99/66 | HR 75 | Wt 193.0 lb

## 2020-12-08 DIAGNOSIS — Z3009 Encounter for other general counseling and advice on contraception: Secondary | ICD-10-CM

## 2020-12-08 DIAGNOSIS — O039 Complete or unspecified spontaneous abortion without complication: Secondary | ICD-10-CM

## 2020-12-08 NOTE — Progress Notes (Signed)
   GYNECOLOGY PROGRESS NOTE  History:  30 y.o. G2P1001 presents to [facility] office today for a miscarriage follow up visit. She initially presented to MAU on 11/21/20 and had hcg of 81 and ultrasound with no evidence of IUP or ectopic.  She returned on 11/23/20 for repeat hcg and hcg dropped significantly to 22.    She reports no bleeding or pain, just some bloating she attributes to the recent pregnancy.  She desires to prevent pregnancy for a while and is interested in a Paragard IUD for birth control.  She denies h/a, dizziness, shortness of breath, n/v, or fever/chills.    The following portions of the patient's history were reviewed and updated as appropriate: allergies, current medications, past family history, past medical history, past social history, past surgical history and problem list. Last pap smear on 01/08/20 was normal.  Health Maintenance Due  Topic Date Due   COVID-19 Vaccine (1) Never done   TETANUS/TDAP  Never done   INFLUENZA VACCINE  Never done     Review of Systems:  Pertinent items are noted in HPI.   Objective:  Physical Exam Last menstrual period 10/16/2020, currently breastfeeding. VS reviewed, nursing note reviewed,  Constitutional: well developed, well nourished, no distress HEENT: normocephalic CV: normal rate Pulm/chest wall: normal effort Breast Exam: deferred Abdomen: soft Neuro: alert and oriented x 3 Skin: warm, dry Psych: affect normal Pelvic exam: Cervix pink, visually closed, without lesion, scant white creamy discharge, vaginal walls and external genitalia normal Bimanual exam: Cervix 0/long/high, firm, anterior, neg CMT, uterus nontender, nonenlarged, adnexa without tenderness, enlargement, or mass  Assessment & Plan:  1. SAB (spontaneous abortion) --Pt asymptomatic with appropriate drop in hcg from 8/26 to 8/28 --Nonstat hcg today  - Beta hCG quant (ref lab)  2. General counseling and advice on contraceptive management --Reviewed  all options, with LARCs presented as most effective.  Pt and her husband have talked about a Paragard IUD, as pt had side effects from previous hormonal contraceptive options.   --Pt to schedule IUD insertion as soon as possible, use condoms until appt.    [name], CNM 8:17 AM

## 2020-12-08 NOTE — Progress Notes (Signed)
Pt is in office for f/u SAB.  Pt denies any bleeding or pain, +bloating.  Pt would like to discuss BC options, unsure of choice.  Pt has had recent intercourse.

## 2020-12-09 LAB — BETA HCG QUANT (REF LAB): hCG Quant: <1 m[IU]/mL

## 2021-01-12 ENCOUNTER — Encounter: Payer: Medicaid Other | Admitting: Advanced Practice Midwife

## 2021-01-13 ENCOUNTER — Encounter: Payer: Self-pay | Admitting: Advanced Practice Midwife

## 2021-01-13 ENCOUNTER — Ambulatory Visit (INDEPENDENT_AMBULATORY_CARE_PROVIDER_SITE_OTHER): Payer: Medicaid Other | Admitting: Advanced Practice Midwife

## 2021-01-13 ENCOUNTER — Other Ambulatory Visit: Payer: Self-pay

## 2021-01-13 ENCOUNTER — Ambulatory Visit (INDEPENDENT_AMBULATORY_CARE_PROVIDER_SITE_OTHER): Payer: Medicaid Other

## 2021-01-13 VITALS — BP 119/71 | HR 76 | Ht 64.0 in | Wt 198.0 lb

## 2021-01-13 DIAGNOSIS — Z331 Pregnant state, incidental: Secondary | ICD-10-CM | POA: Diagnosis not present

## 2021-01-13 DIAGNOSIS — Z30430 Encounter for insertion of intrauterine contraceptive device: Secondary | ICD-10-CM

## 2021-01-13 DIAGNOSIS — Z3687 Encounter for antenatal screening for uncertain dates: Secondary | ICD-10-CM

## 2021-01-13 LAB — POCT URINE PREGNANCY: Preg Test, Ur: POSITIVE — AB

## 2021-01-13 NOTE — Progress Notes (Signed)
   GYNECOLOGY OFFICE PROCEDURE NOTE  Emma Robbins is a 30 y.o. G2P1001 here for [place] IUD insertion. No GYN concerns.  Last pap smear was on 01/08/20 and was normal.  Pt reports nausea x 2 weeks and some mild menstrual-like cramping today.  She had bleeding 11/23/20 and into early September with her miscarriage and has not had any menses since then. She is using condoms for contraception but reports at least 2 times when she had intercourse without condoms.  Results for orders placed or performed in visit on 01/13/21 (from the past 24 hour(s))  POCT urine pregnancy     Status: Abnormal   Collection Time: 01/13/21  3:58 PM  Result Value Ref Range   Preg Test, Ur Positive (A) Negative     Pt pregnancy test is positive today so IUD is not placed.  If menses is used for LMP, pt may be as far as 8 weeks but likely less. Pt reports some mild cramping so [place] was performed in the office today confirming IUP at [redacted]w[redacted]d.  Pt to return for viability Korea and new OB intake in 2 weeks.  Pt is surprised but welcomes the pregnancy at this time.

## 2021-01-20 ENCOUNTER — Other Ambulatory Visit: Payer: Self-pay

## 2021-01-20 MED ORDER — ONDANSETRON 4 MG PO TBDP: 4.0000 mg | ORAL_TABLET | Freq: Four times a day (QID) | ORAL | 3 refills | Status: DC | PRN

## 2021-01-20 MED ORDER — VITAFOL GUMMIES 3.33-0.333-34.8 MG PO CHEW: 3.0000 | CHEWABLE_TABLET | Freq: Every day | ORAL | 6 refills | Status: DC

## 2021-02-04 ENCOUNTER — Other Ambulatory Visit: Payer: Self-pay

## 2021-02-04 ENCOUNTER — Ambulatory Visit (INDEPENDENT_AMBULATORY_CARE_PROVIDER_SITE_OTHER): Payer: Medicaid Other

## 2021-02-04 VITALS — BP 109/71 | HR 84 | Ht 64.0 in | Wt 197.0 lb

## 2021-02-04 DIAGNOSIS — Z3480 Encounter for supervision of other normal pregnancy, unspecified trimester: Secondary | ICD-10-CM

## 2021-02-04 DIAGNOSIS — O368390 Maternal care for abnormalities of the fetal heart rate or rhythm, unspecified trimester, not applicable or unspecified: Secondary | ICD-10-CM

## 2021-02-04 DIAGNOSIS — Z3490 Encounter for supervision of normal pregnancy, unspecified, unspecified trimester: Secondary | ICD-10-CM | POA: Insufficient documentation

## 2021-02-04 DIAGNOSIS — Z309 Encounter for contraceptive management, unspecified: Secondary | ICD-10-CM | POA: Diagnosis not present

## 2021-02-04 DIAGNOSIS — O368310 Maternal care for abnormalities of the fetal heart rate or rhythm, first trimester, not applicable or unspecified: Secondary | ICD-10-CM | POA: Diagnosis not present

## 2021-02-04 NOTE — Progress Notes (Signed)
New OB Intake  I connected with  Jone Baseman on 02/04/21 at 10:15 AM EST by in person and verified that I am speaking with the correct person using two identifiers. Nurse is located at [facility] and pt is located at [facility].  I discussed the limitations, risks, security and privacy concerns of performing an evaluation and management service by telephone and the availability of in person appointments. I also discussed with the patient that there may be a patient responsible charge related to this service. The patient expressed understanding and agreed to proceed.  I explained I am completing New OB Intake today. We discussed her EDD of 09/09/21 that is based on early u/s. Pt is G3/P1011. I reviewed her allergies, medications, Medical/Surgical/OB history, and appropriate screenings. I informed her of [facility] services. Based on history, this is a/an  pregnancy complicated by hypothyroidism  .   Patient Active Problem List   Diagnosis Date Noted   Adjustment disorder with mixed anxiety and depressed mood 08/10/2017   Acquired hypothyroidism 07/14/2009   BULIMIA NERVOSA 09/04/2007   ALLERGIC CONJUNCTIVITIS 09/04/2007    Concerns addressed today  Delivery Plans:  Plans to deliver at [facility]   MyChart/Babyscripts MyChart access verified. I explained pt will have some visits in office and some virtually. Babyscripts instructions given and order placed. Patient verifies receipt of registration text/e-mail. Account successfully created and app downloaded.  Blood Pressure Cuff  Blood pressure cuff ordered for patient to pick-up from [employer]. Explained after first prenatal appt pt will check weekly and document in 69.  Weight scale: Patient  have weight scale. Weight scale ordered for patient to pick up form [facility].   Anatomy US Explained first scheduled [place] will be around 19 weeks. FHR reassessed today due to fetal bracdycardia on first u/s. FHR today is  169.  Labs Discussed Johnsie Cancel genetic screening with patient. Would like both Panorama and Horizon drawn at new OB visit. Routine prenatal labs needed.  Covid Vaccine Patient has not covid vaccine.   Mother/ Baby Dyad Candidate?    If yes, offer as possibility  Informed patient of [facility] website  and placed link in her AVS.   Social Determinants of Health Food Insecurity: Patient denies food insecurity. WIC Referral: Patient is interested in referral to [facility].  Transportation: Patient denies transportation needs. Childcare: Discussed no children allowed at ultrasound appointments. Offered childcare services; patient declines childcare services at this time.  Send link to Pregnancy Navigators   Placed OB Box on problem list and updated  First visit review I reviewed new OB appt with pt. I explained she will have a pelvic exam, ob bloodwork with genetic screening, and PAP smear. Explained pt will be seen by [name] at first visit; encounter routed to appropriate provider. Explained that patient will be seen by pregnancy navigator following visit with provider. [facility] information placed in AVS.   Lucianne Lei, RN 02/04/2021  10:00 AM

## 2021-02-17 ENCOUNTER — Encounter: Payer: Self-pay | Admitting: Obstetrics

## 2021-02-17 ENCOUNTER — Ambulatory Visit (INDEPENDENT_AMBULATORY_CARE_PROVIDER_SITE_OTHER): Payer: Medicaid Other | Admitting: Advanced Practice Midwife

## 2021-02-17 ENCOUNTER — Other Ambulatory Visit: Payer: Self-pay

## 2021-02-17 ENCOUNTER — Other Ambulatory Visit (HOSPITAL_COMMUNITY)
Admission: RE | Admit: 2021-02-17 | Discharge: 2021-02-17 | Disposition: A | Discharge status: Home / Self Care | Payer: Medicaid Other | Source: Ambulatory Visit | Attending: Advanced Practice Midwife | Admitting: Advanced Practice Midwife

## 2021-02-17 VITALS — BP 111/76 | HR 73 | Wt 202.0 lb

## 2021-02-17 DIAGNOSIS — Z3480 Encounter for supervision of other normal pregnancy, unspecified trimester: Secondary | ICD-10-CM

## 2021-02-17 DIAGNOSIS — O99891 Other specified diseases and conditions complicating pregnancy: Secondary | ICD-10-CM

## 2021-02-17 DIAGNOSIS — Z3A19 19 weeks gestation of pregnancy: Secondary | ICD-10-CM

## 2021-02-17 DIAGNOSIS — M549 Dorsalgia, unspecified: Secondary | ICD-10-CM

## 2021-02-17 NOTE — Progress Notes (Signed)
Subjective:   Emma Robbins is a 30 y.o. G3P1011 at [redacted]w[redacted]d by early ultrasound being seen today for her first obstetrical visit.  Her obstetrical history is significant for  SVD x 1 and SAB x 1  and has BULIMIA NERVOSA; ALLERGIC CONJUNCTIVITIS; Acquired hypothyroidism; Adjustment disorder with mixed anxiety and depressed mood; and Encounter for supervision of normal pregnancy on their problem list.. Patient does intend to breast feed. Pregnancy history fully reviewed.  Patient reports backache and nausea.  HISTORY: OB History  Gravida Para Term Preterm AB Living  3 1 1  0 1 1  SAB IAB Ectopic Multiple Live Births  1 0 0 0 1    # Outcome Date GA Lbr Len/2nd Weight Sex Delivery Anes PTL Lv  3 Current           2 SAB 12/08/20 [redacted]w[redacted]d         1 Term 05/21/20 [redacted]w[redacted]d 06:35 / 00:42 6 lb 7.9 oz (2.946 kg) F Vag-Spont None  LIV     Name: VILLEGAS-RODRIGUEZ,GIRL Meigan     Apgar1: 8  Apgar5: 9   Past Medical History:  Diagnosis Date   Anxiety    Depression    Gallstone 11/2011   Headache(784.0)    unspecified - 2-3 x/week   Hemorrhoids    Thyroid disease    Past Surgical History:  Procedure Laterality Date   BREAST SURGERY  2012   Fibroadenoma   CHOLECYSTECTOMY  12/09/2011   Procedure: LAPAROSCOPIC CHOLECYSTECTOMY;  Surgeon: Haywood Lasso, MD;  Location: [facility];  Service: General;  Laterality: N/A;   LAPAROSCOPIC OVARIAN CYSTECTOMY  12/16/10   Right cystadenofibroma   Family History  Problem Relation Age of Onset   Hyperlipidemia Father    Thyroid disease Father    Diabetes Father    Thyroid disease Brother    Thyroid disease Sister    Hypertension Maternal Grandmother    Diabetes Paternal Grandmother    Hyperlipidemia Paternal Grandmother    Cancer Paternal Grandfather        Prostate   Social History   Tobacco Use   Smoking status: Never   Smokeless tobacco: Never  Vaping Use   Vaping Use: Never used  Substance Use Topics   Alcohol use: Not  Currently    Alcohol/week: 3.0 standard drinks    Types: 3 Standard drinks or equivalent per week    Comment: Rare   Drug use: Not Currently    Types: Cocaine    Comment: per pt did Cocaine 2 yrs ago.05-2017   No Known Allergies Current Outpatient Medications on File Prior to Visit  Medication Sig Dispense Refill   levothyroxine (EUTHYROX) 25 MCG tablet Take 1 tablet (25 mcg total) by mouth daily before breakfast. 30 tablet 11   ondansetron (ZOFRAN ODT) 4 MG disintegrating tablet Take 1 tablet (4 mg total) by mouth every 6 (six) hours as needed for nausea. (Patient not taking: Reported on 02/04/2021) 20 tablet 3   Prenatal Vit-Fe Phos-FA-Omega (VITAFOL GUMMIES) 3.33-0.333-34.8 MG CHEW Chew 3 tablets by mouth daily. 90 tablet 6   No current facility-administered medications on file prior to visit.     Indications for ASA therapy (per uptodate) One of the following: Previous pregnancy with preeclampsia, especially early onset and with an adverse outcome No Multifetal gestation No Chronic hypertension No Type 1 or 2 diabetes mellitus No Chronic kidney disease No Autoimmune disease (antiphospholipid syndrome, systemic lupus erythematosus) No   Two or more of the  following: Nulliparity No Obesity (body mass index >30 kg/m2) Yes Family history of preeclampsia in mother or sister No Age ?35 years No Sociodemographic characteristics (African American race, low socioeconomic level) No Personal risk factors (eg, previous pregnancy with low birth weight or small for gestational age infant, previous adverse pregnancy outcome [eg, stillbirth], interval >10 years between pregnancies) No   Indications for early 1 hour GTT (per uptodate)  BMI >25 (>23 in Asian women) AND one of the following  Gestational diabetes mellitus in a previous pregnancy No Glycated hemoglobin ?5.7 percent (39 mmol/mol), impaired glucose tolerance, or impaired fasting glucose on previous testing No First-degree  relative with diabetes No High-risk race/ethnicity (eg, African American, Latino, Native American, [place] American, Pacific Islander) Yes History of cardiovascular disease No Hypertension or on therapy for hypertension No High-density lipoprotein cholesterol level <35 mg/dL (0.90 mmol/L) and/or a triglyceride level >250 mg/dL (2.82 mmol/L) No Polycystic ovary syndrome No Physical inactivity No Other clinical condition associated with insulin resistance (eg, severe obesity, acanthosis nigricans) No Previous birth of an infant weighing ?4000 g No Previous stillbirth of unknown cause No Exam   Vitals:   02/17/21 1453  BP: 111/76  Pulse: 73  Weight: 202 lb (91.6 kg)   Fetal Heart Rate (bpm): 161  VS reviewed, nursing note reviewed,  Constitutional: well developed, well nourished, no distress HEENT: normocephalic CV: normal rate Pulm/chest wall: normal effort Abdomen: soft Neuro: alert and oriented x 3 Skin: warm, dry Psych: affect normal   Vaginal cultures collected by CNM by blind swab   Assessment:   Pregnancy: G3P1011 Patient Active Problem List   Diagnosis Date Noted   Encounter for supervision of normal pregnancy 02/04/2021   Adjustment disorder with mixed anxiety and depressed mood 08/10/2017   Acquired hypothyroidism 07/14/2009   BULIMIA NERVOSA 09/04/2007   ALLERGIC CONJUNCTIVITIS 09/04/2007     Plan:  1. Supervision of other normal pregnancy, antepartum --Anticipatory guidance about next visits/weeks of pregnancy given. --Next visit in 4 weeks --Pap wnl in 2021  - Cervicovaginal ancillary only( [place]) - Culture, OB Urine - Genetic Screening - Obstetric Panel, Including HIV - Hepatitis C Antibody - [place] MFM OB COMP + 14 WK; Future  2. Back pain affecting pregnancy in first trimester --Pt went hiking recently and has MSK back pain since --Has chiropractor, discussed chiropractic in pregnancy as safe if she desires to continue --Rest/ice/heat/warm  bath/increase PO fluids/Tylenol/pregnancy support belt for second/third trimester --If pain persists, consider referral to PT    Initial labs drawn. Continue prenatal vitamins. Discussed and offered genetic screening options, including Quad screen/AFP, NIPS testing, and option to decline testing. Benefits/risks/alternatives reviewed. Pt aware that anatomy US is form of genetic screening with lower accuracy in detecting trisomies than blood work.  Pt chooses genetic screening today. NIPS: requested. Ultrasound discussed; fetal anatomic survey: requested. Problem list reviewed and updated. The nature of [facility] [place] with multiple MDs and other Advanced Practice Providers was explained to patient; also emphasized that residents, students are part of our team. Routine obstetric precautions reviewed. No follow-ups on file.   [name], CNM 02/17/21 3:46 PM

## 2021-02-18 LAB — OBSTETRIC PANEL, INCLUDING HIV
Antibody Screen: NEGATIVE
Basophils Absolute: 0 10*3/uL (ref 0.0–0.2)
Basos: 0 %
EOS (ABSOLUTE): 0.1 10*3/uL (ref 0.0–0.4)
Eos: 1 %
HIV Screen 4th Generation wRfx: NONREACTIVE
Hematocrit: 39.9 % (ref 34.0–46.6)
Hemoglobin: 13.6 g/dL (ref 11.1–15.9)
Hepatitis B Surface Ag: NEGATIVE
Immature Grans (Abs): 0 10*3/uL (ref 0.0–0.1)
Immature Granulocytes: 0 %
Lymphocytes Absolute: 2.3 10*3/uL (ref 0.7–3.1)
Lymphs: 25 %
MCH: 31.9 pg (ref 26.6–33.0)
MCHC: 34.1 g/dL (ref 31.5–35.7)
MCV: 93 fL (ref 79–97)
Monocytes Absolute: 0.6 10*3/uL (ref 0.1–0.9)
Monocytes: 7 %
Neutrophils Absolute: 6 10*3/uL (ref 1.4–7.0)
Neutrophils: 67 %
Platelets: 254 10*3/uL (ref 150–450)
RBC: 4.27 x10E6/uL (ref 3.77–5.28)
RDW: 12.8 % (ref 11.7–15.4)
RPR Ser Ql: NONREACTIVE
Rh Factor: POSITIVE
Rubella Antibodies, IGG: 1.29 index (ref >0.99)
WBC: 9 10*3/uL (ref 3.4–10.8)

## 2021-02-18 LAB — CERVICOVAGINAL ANCILLARY ONLY
Chlamydia: NEGATIVE
Comment: NEGATIVE
Comment: NEGATIVE
Comment: NORMAL
Neisseria Gonorrhea: NEGATIVE
Trichomonas: NEGATIVE

## 2021-02-18 LAB — HEPATITIS C ANTIBODY: Hep C Virus Ab: <0.1 s/co ratio (ref 0.0–0.9)

## 2021-02-19 LAB — URINE CULTURE, OB REFLEX

## 2021-02-19 LAB — CULTURE, OB URINE

## 2021-02-25 ENCOUNTER — Encounter: Payer: Self-pay | Admitting: Advanced Practice Midwife

## 2021-03-02 ENCOUNTER — Encounter: Payer: Self-pay | Admitting: Advanced Practice Midwife

## 2021-03-06 ENCOUNTER — Encounter: Payer: Self-pay | Admitting: Advanced Practice Midwife

## 2021-03-18 ENCOUNTER — Other Ambulatory Visit: Payer: Self-pay

## 2021-03-18 ENCOUNTER — Ambulatory Visit (INDEPENDENT_AMBULATORY_CARE_PROVIDER_SITE_OTHER): Payer: Medicaid Other | Admitting: Women's Health

## 2021-03-18 VITALS — BP 111/69 | HR 77 | Wt 198.0 lb

## 2021-03-18 DIAGNOSIS — E039 Hypothyroidism, unspecified: Secondary | ICD-10-CM

## 2021-03-18 DIAGNOSIS — O219 Vomiting of pregnancy, unspecified: Secondary | ICD-10-CM | POA: Insufficient documentation

## 2021-03-18 DIAGNOSIS — Z3480 Encounter for supervision of other normal pregnancy, unspecified trimester: Secondary | ICD-10-CM

## 2021-03-18 DIAGNOSIS — F4323 Adjustment disorder with mixed anxiety and depressed mood: Secondary | ICD-10-CM

## 2021-03-18 DIAGNOSIS — F502 Bulimia nervosa: Secondary | ICD-10-CM

## 2021-03-18 DIAGNOSIS — Z3A15 15 weeks gestation of pregnancy: Secondary | ICD-10-CM

## 2021-03-18 NOTE — Progress Notes (Signed)
ROB/AFP, c/o NV, Zofran makes her sleepy, she wants something different, pinching pain 2-3/10 on left side.

## 2021-03-18 NOTE — Patient Instructions (Addendum)
Maternity Assessment Unit (MAU)  The Maternity Assessment Unit (MAU) is located at the [facility] and [place] [facility]. The address is: [address] [place], [place], [place] [address]. Please see map below for additional directions.    The Maternity Assessment Unit is designed to help you during your pregnancy, and for up to 6 weeks after delivery, with any pregnancy- or postpartum-related emergencies, if you think you are in labor, or if your water has broken. For example, if you experience nausea and vomiting, vaginal bleeding, severe abdominal or pelvic pain, elevated blood pressure or other problems related to your pregnancy or postpartum time, please come to the Maternity Assessment Unit for assistance.       Childbirth Education Options: [facility] Department Classes:  Childbirth education classes can help you get ready for a positive parenting experience. You can also meet other expectant parents and get free stuff for your baby. Each class runs for five weeks on the same night and costs $45 for the mother-to-be and her support person. Medicaid covers the cost if you are eligible. Call [phone] to register. [place] Childbirth Education: Classes can vary in availability and schedule is subject to change. For most up-to-date information please visit [email or web address] to review and register.                              Safe Medications in Pregnancy    Acne: Benzoyl Peroxide Salicylic Acid  Backache/Headache: Tylenol: 2 regular strength every 4 hours OR              2 Extra strength every 6 hours  Colds/Coughs/Allergies: Benadryl (alcohol free) 25 mg every 6 hours as needed Breath right strips Claritin Cepacol throat lozenges Chloraseptic throat spray Cold-Eeze- up to three times per day Cough drops, alcohol free Flonase (by prescription only) Guaifenesin Mucinex Robitussin DM (plain only,  alcohol free) Saline nasal spray/drops Sudafed (pseudoephedrine) & Actifed ** use only after [redacted] weeks gestation and if you do not have high blood pressure Tylenol Vicks Vaporub Zinc lozenges Zyrtec   Constipation: Colace Ducolax suppositories Fleet enema Glycerin suppositories Metamucil Milk of magnesia Miralax Senokot Smooth move tea  Diarrhea: Kaopectate Imodium A-D  *NO pepto Bismol  Hemorrhoids: Anusol Anusol HC Preparation H Tucks  Indigestion: Tums Maalox Mylanta Zantac  Pepcid  Insomnia: Benadryl (alcohol free) 25mg  every 6 hours as needed Tylenol PM Unisom, no Gelcaps  Leg Cramps: Tums MagGel  Nausea/Vomiting:  Bonine Dramamine Emetrol Ginger extract Sea bands Meclizine  Nausea medication to take during pregnancy:  Unisom (doxylamine succinate 25 mg tablets) Take one tablet daily at bedtime. If symptoms are not adequately controlled, the dose can be increased to a maximum recommended dose of two tablets daily (1/2 tablet in the morning, 1/2 tablet mid-afternoon and one at bedtime). Vitamin B6 100mg  tablets. Take one tablet twice a day (up to 200 mg per day).  Skin Rashes: Aveeno products Benadryl cream or 25mg  every 6 hours as needed Calamine Lotion 1% cortisone cream  Yeast infection: Gyne-lotrimin 7 Monistat 7   **If taking multiple medications, please check labels to avoid duplicating the same active ingredients **take medication as directed on the label ** Do not exceed 4000 mg of tylenol in 24 hours **Do not take medications that contain aspirin or ibuprofen         You have constipation which is hard stools that are difficult to pass.  It is important to have regular bowel movements every 1-3 days that are soft and easy to pass. Hard stools increase your risk of hemorrhoids and are very uncomfortable.   To prevent constipation you can increase the amount of fiber in your diet. Examples of foods with fiber are leafy  greens, whole grain breads, oatmeal and other grains.  It is also important to drink at least eight 8oz glass of water everyday.   If you have not has a bowel movement in 4-5 days you made need to clean out your bowel.  This will have establish normal movement through your bowel.    Miralax Clean out Take 8 capfuls of miralax in 64 oz of gatorade. You can use any fluid that appeals to you (gatorade, water, juice) Continue to drink at least eight 8 oz glasses of water throughout the day You can repeat with another 8 capfuls of miralax in 64 oz of gatorade if you are not having a large amount of stools You will need to be at home and close to a bathroom for about 8 hours when you do the above as you may need to go to the bathroom frequently.   After you are cleaned out: - Start Colace100mg  twice daily - Start Miralax once daily - Start a daily fiber supplement like metamucil or citrucel - You can safely use enemas in pregnancy  - if you are having diarrhea you can reduce to Colace once a day or miralax every other day or a 1/2 capful daily.

## 2021-03-18 NOTE — Progress Notes (Signed)
Subjective:  Emma Robbins is a 30 y.o. G3P1011 at [redacted]w[redacted]d being seen today for ongoing prenatal care.  She is currently monitored for the following issues for this low-risk pregnancy and has BULIMIA NERVOSA; ALLERGIC CONJUNCTIVITIS; Acquired hypothyroidism; Adjustment disorder with mixed anxiety and depressed mood; Encounter for supervision of normal pregnancy; and Nausea and vomiting in pregnancy on their problem list.  Patient reports no complaints.  Contractions: Not present. Vag. Bleeding: None.   . Denies leaking of fluid.   The following portions of the patient's history were reviewed and updated as appropriate: allergies, current medications, past family history, past medical history, past social history, past surgical history and problem list. Problem list updated.  Objective:   Vitals:   03/18/21 1040  BP: 111/69  Pulse: 77  Weight: 198 lb (89.8 kg)    Fetal Status: Fetal Heart Rate (bpm): 145         General:  Alert, oriented and cooperative. Patient is in no acute distress.  Skin: Skin is warm and dry. No rash noted.   Cardiovascular: Normal heart rate noted  Respiratory: Normal respiratory effort, no problems with respiration noted  Abdomen: Soft, gravid, appropriate for gestational age. Pain/Pressure: Absent     Pelvic: Vag. Bleeding: None     Cervical exam deferred        Extremities: Normal range of motion.  Edema: None  Mental Status: Normal mood and affect. Normal behavior. Normal judgment and thought content.   Urinalysis:      Assessment and Plan:  Pregnancy: G3P1011 at [redacted]w[redacted]d  1. Supervision of other normal pregnancy, antepartum - AFP, Serum, Open Spina Bifida - CBE info given - discussed travel in pregnancy, pt advised to stay well-hydrated, no sit for extended periods of time, frequent movement, especially if flying/driving, bring copy of medical records and locate closest hospital with L&D floor to present to with pregnancy related emergency  2. Acquired  hypothyroidism - no thyroid labs since 02/2020, will perform today - TSH - T4 - T4, free - T3  3. Adjustment disorder with mixed anxiety and depressed mood PHQ9 SCORE ONLY 02/04/2021 01/02/2020 08/10/2017  PHQ-9 Total Score 4 0 26   GAD 7 : Generalized Anxiety Score 02/04/2021  Nervous, Anxious, on Edge 0  Control/stop worrying 0  Worry too much - different things 0  Trouble relaxing 0  Restless 1  Easily annoyed or irritable 1  Afraid - awful might happen 0  Total GAD 7 Score 2   4. Bulimia nervosa -pt reports no issues with this since she was 30yo, denies any abnormal eating/vomiting at this time  5. [redacted] weeks gestation of pregnancy  Preterm labor symptoms and general obstetric precautions including but not limited to vaginal bleeding, contractions, leaking of fluid and fetal movement were reviewed in detail with the patient. I discussed the assessment and treatment plan with the patient. The patient was provided an opportunity to ask questions and all were answered. The patient agreed with the plan and demonstrated an understanding of the instructions. The patient was advised to call back or seek an in-person office evaluation/go to MAU at [facility] for any urgent or concerning symptoms. Please refer to After Visit Summary for other counseling recommendations.  Return in about 5 weeks (around 04/22/2021) for in-person LOB/APP OK.   Kennia Vanvorst, Gerrie Nordmann, NP

## 2021-03-20 LAB — AFP, SERUM, OPEN SPINA BIFIDA
AFP MoM: 0.58
AFP Value: 14.9 ng/mL
Gest. Age on Collection Date: 15 weeks
Maternal Age At EDD: 30.6 yr
OSBR Risk 1 IN: 10000
Test Results:: NEGATIVE
Weight: 198 [lb_av]

## 2021-03-20 LAB — T4, FREE: Free T4: 0.98 ng/dL (ref 0.82–1.77)

## 2021-03-20 LAB — T4: T4, Total: 10.4 ug/dL (ref 4.5–12.0)

## 2021-03-20 LAB — TSH: TSH: 3.2 u[IU]/mL (ref 0.450–4.500)

## 2021-03-20 LAB — T3: T3, Total: 173 ng/dL (ref 71–180)

## 2021-04-15 ENCOUNTER — Ambulatory Visit: Payer: Medicaid Other

## 2021-04-29 ENCOUNTER — Other Ambulatory Visit: Payer: Self-pay | Admitting: Advanced Practice Midwife

## 2021-04-29 ENCOUNTER — Ambulatory Visit (HOSPITAL_BASED_OUTPATIENT_CLINIC_OR_DEPARTMENT_OTHER): Payer: Medicaid Other | Admitting: Maternal & Fetal Medicine

## 2021-04-29 ENCOUNTER — Other Ambulatory Visit: Payer: Self-pay

## 2021-04-29 ENCOUNTER — Ambulatory Visit: Payer: Medicaid Other | Attending: Advanced Practice Midwife

## 2021-04-29 ENCOUNTER — Ambulatory Visit: Payer: Medicaid Other | Admitting: *Deleted

## 2021-04-29 VITALS — BP 109/59 | HR 87

## 2021-04-29 DIAGNOSIS — Z3A19 19 weeks gestation of pregnancy: Secondary | ICD-10-CM

## 2021-04-29 DIAGNOSIS — E039 Hypothyroidism, unspecified: Secondary | ICD-10-CM | POA: Insufficient documentation

## 2021-04-29 DIAGNOSIS — O99212 Obesity complicating pregnancy, second trimester: Secondary | ICD-10-CM

## 2021-04-29 DIAGNOSIS — Z3480 Encounter for supervision of other normal pregnancy, unspecified trimester: Secondary | ICD-10-CM | POA: Diagnosis not present

## 2021-04-29 DIAGNOSIS — O99280 Endocrine, nutritional and metabolic diseases complicating pregnancy, unspecified trimester: Secondary | ICD-10-CM | POA: Insufficient documentation

## 2021-04-29 DIAGNOSIS — Z3A21 21 weeks gestation of pregnancy: Secondary | ICD-10-CM | POA: Diagnosis not present

## 2021-04-29 DIAGNOSIS — O99282 Endocrine, nutritional and metabolic diseases complicating pregnancy, second trimester: Secondary | ICD-10-CM | POA: Diagnosis not present

## 2021-04-29 DIAGNOSIS — Z363 Encounter for antenatal screening for malformations: Secondary | ICD-10-CM | POA: Diagnosis not present

## 2021-04-29 NOTE — Progress Notes (Signed)
MFM Brief Note  Emma Robbins is a G3P1 who is here at 21w 0d with an EDD of 09/09/21 based on a 5w 6d ultrasound.  She is seen at the request of [name], CNM regarding a detailed ultrasound and history of hypothyroidism  She reports no s/sx of preterm labor or preeclampsia. She has a LR NIPS, Neg-Horizon and Neg-AFP.  Single intrauterine pregnancy here for a detailed anatomy for hypothyrodism Normal anatomy with measurements consistent with dates There is good fetal movement and amniotic fluid volume  I discussed the normal nature of today's exam with [name].  She reports having hypothyrodism, but has stopped taking her medication early in the pregnancy due to increased N/V. She had labs drawn in late December were overall normal however the TSH was 3.2 , our preferred level is 2.5. We recommend repeating her TFT's if her TSH continues to increased above 2.5 we recommend restarting her synthroid.  Thyroid levels should be assessed every 4-6 weeks while medications are being adjusted then every trimester. Postnatal evaluation and medication adjustment will be necessary as well.  Hypothyroidism can have adverse effects on pregnancy outcomes, depending upon the severity of the biochemical abnormalities. Poorly controlled hypothyroidism has been associated with an increased risk of several complications, including: preeclampsia and gestational hypertension, placental abruption, nonreassuring fetal heart rate tracing, peterm delivery, including very preterm delivery (before 32 weeks), increased rate of cesarean section, perinatal morbidity and mortality, neuropsychological and cognitive impairment and postpartum hemorrhage.   All questions answered.  I spent 20 minutes with > 50% in face to face consultation.  Emma Ports, MD.

## 2021-05-11 ENCOUNTER — Other Ambulatory Visit: Payer: Medicaid Other

## 2021-05-11 ENCOUNTER — Encounter: Payer: Medicaid Other | Admitting: Obstetrics and Gynecology

## 2021-05-18 ENCOUNTER — Other Ambulatory Visit: Payer: Medicaid Other

## 2021-05-18 ENCOUNTER — Other Ambulatory Visit: Payer: Self-pay

## 2021-05-18 ENCOUNTER — Ambulatory Visit (INDEPENDENT_AMBULATORY_CARE_PROVIDER_SITE_OTHER): Payer: Medicaid Other | Admitting: Obstetrics & Gynecology

## 2021-05-18 VITALS — BP 100/67 | HR 88 | Wt 213.6 lb

## 2021-05-18 DIAGNOSIS — Z3480 Encounter for supervision of other normal pregnancy, unspecified trimester: Secondary | ICD-10-CM

## 2021-05-18 DIAGNOSIS — E039 Hypothyroidism, unspecified: Secondary | ICD-10-CM

## 2021-05-18 MED ORDER — COMFORT FIT MATERNITY SUPP LG MISC: 1.0000 [IU] | Freq: Every day | 0 refills | Status: DC

## 2021-05-18 NOTE — Progress Notes (Signed)
° °  PRENATAL VISIT NOTE  Subjective:  Emma Robbins is a 31 y.o. G3P1011 at [redacted]w[redacted]d being seen today for ongoing prenatal care.  She is currently monitored for the following issues for this high-risk pregnancy and has Acquired hypothyroidism; Adjustment disorder with mixed anxiety and depressed mood; Encounter for supervision of normal pregnancy; and Nausea and vomiting in pregnancy on their problem list.  Patient reports nausea.  Contractions: Not present. Vag. Bleeding: None.  Movement: Present. Denies leaking of fluid.   The following portions of the patient's history were reviewed and updated as appropriate: allergies, current medications, past family history, past medical history, past social history, past surgical history and problem list.   Objective:   Vitals:   05/18/21 0845  BP: 100/67  Pulse: 88  Weight: 213 lb 9.6 oz (96.9 kg)    Fetal Status: Fetal Heart Rate (bpm): 145   Movement: Present     General:  Alert, oriented and cooperative. Patient is in no acute distress.  Skin: Skin is warm and dry. No rash noted.   Cardiovascular: Normal heart rate noted  Respiratory: Normal respiratory effort, no problems with respiration noted  Abdomen: Soft, gravid, appropriate for gestational age.  Pain/Pressure: Absent     Pelvic: Cervical exam deferred        Extremities: Normal range of motion.  Edema: Trace  Mental Status: Normal mood and affect. Normal behavior. Normal judgment and thought content.   Assessment and Plan:  Pregnancy: G3P1011 at [redacted]w[redacted]d 1. Supervision of other normal pregnancy, antepartum Sciayic pain - Elastic Bandages & Supports (COMFORT FIT MATERNITY SUPP LG) MISC; 1 Units by Does not apply route daily.  Dispense: 1 each; Refill: 0  2. Acquired hypothyroidism Take daily, f/u next visit   Preterm labor symptoms and general obstetric precautions including but not limited to vaginal bleeding, contractions, leaking of fluid and fetal movement were reviewed in  detail with the patient. Please refer to After Visit Summary for other counseling recommendations.   Return in about 4 weeks (around 06/15/2021).  Future Appointments  Date Time Provider Ray  06/10/2021  8:35 AM Renee Harder, CNM [facility] None  06/17/2021  8:00 AM [facility] None    Emeterio Reeve, MD

## 2021-05-18 NOTE — Progress Notes (Signed)
Patient presents for ROB. Not taking prenatal or levothyroxine. States she is unable to keep down. Has n/v daily. Interested in discussing water birth. No other concerns.

## 2021-06-10 ENCOUNTER — Ambulatory Visit (INDEPENDENT_AMBULATORY_CARE_PROVIDER_SITE_OTHER): Payer: Medicaid Other

## 2021-06-10 ENCOUNTER — Other Ambulatory Visit: Payer: Medicaid Other

## 2021-06-10 ENCOUNTER — Ambulatory Visit (INDEPENDENT_AMBULATORY_CARE_PROVIDER_SITE_OTHER): Payer: Medicaid Other | Admitting: Licensed Clinical Social Worker

## 2021-06-10 ENCOUNTER — Other Ambulatory Visit: Payer: Self-pay

## 2021-06-10 VITALS — BP 112/72 | HR 84 | Wt 213.0 lb

## 2021-06-10 DIAGNOSIS — F32A Depression, unspecified: Secondary | ICD-10-CM

## 2021-06-10 DIAGNOSIS — E039 Hypothyroidism, unspecified: Secondary | ICD-10-CM

## 2021-06-10 DIAGNOSIS — Z3A27 27 weeks gestation of pregnancy: Secondary | ICD-10-CM

## 2021-06-10 DIAGNOSIS — O99340 Other mental disorders complicating pregnancy, unspecified trimester: Secondary | ICD-10-CM

## 2021-06-10 DIAGNOSIS — Z3480 Encounter for supervision of other normal pregnancy, unspecified trimester: Secondary | ICD-10-CM

## 2021-06-10 NOTE — Progress Notes (Addendum)
ROB/GTT, Pt declined TDAP vaccine.  C/o itching and rash on body. ? ?PHQ-9=10 ?

## 2021-06-10 NOTE — Patient Instructions (Signed)
?  Considering Waterbirth? ?Guide for patients at [facility]) ?Why consider waterbirth? ?Gentle birth for babies  ?Less pain medicine used in labor  ?May allow for passive descent/less pushing  ?May reduce perineal tears  ?More mobility and instinctive maternal position changes  ?Increased maternal relaxation  ? ?Is waterbirth safe? What are the risks of infection, drowning or other complications? ?Infection:  ?Very low risk (3.7 % for tub vs 4.8% for bed)  ?7 in 45 waterbirths with documented infection  ?Poorly cleaned equipment most common cause  ?Slightly lower group B strep transmission rate  ?Drowning  ?Maternal:  ?Very low risk  ?Related to seizures or fainting  ?Newborn:  ?Very low risk. No evidence of increased risk of respiratory problems in multiple large studies  ?Physiological protection from breathing under water  ?Avoid underwater birth if there are any fetal complications  ?Once baby's head is out of the water, keep it out.  ?Birth complication  ?Some reports of cord trauma, but risk decreased by bringing baby to surface gradually  ?No evidence of increased risk of shoulder dystocia. Mothers can usually change positions faster in water than in a bed, possibly aiding the maneuvers to free the shoulder.  ? ?There are 2 things you MUST do to have a waterbirth with [facility]: ?Attend a waterbirth class at [place] at [facility]   ?3rd Wednesday of every month from 7-9 pm (virtual during [place]) ?Free ?Register online at [email or web address] or [email or web address] or by calling [phone] ?Bring [place] the certificate from the class to your prenatal appointment or send via MyChart ?Meet with a midwife at 36 weeks* to see if you can still plan a waterbirth and to sign the consent.  ? ?*We also recommend that you schedule as many of your prenatal visits with a midwife as possible.   ? ?Helpful information: ?You may want to bring a bathing suit top to the hospital  to wear during labor but this is optional.  All other supplies are provided by the hospital. ?Please arrive at the hospital with signs of active labor, and do not wait at home until late in labor. It takes 45 min- 2 hours for COVID testing, fetal monitoring, and check in to your room to take place, plus transport and filling of the waterbirth tub.   ? ?Things that would prevent you from having a waterbirth: ?Unknown or Positive COVID-19 diagnosis upon admission to hospital* ?Premature, <37wks  ?Previous cesarean birth  ?Presence of thick meconium-stained fluid  ?Multiple gestation (Twins, triplets, etc.)  ?Uncontrolled diabetes or gestational diabetes requiring medication  ?Hypertension diagnosed in pregnancy or preexisting hypertension (gestational hypertension, preeclampsia, or chronic hypertension) ?Fetal growth restriction (your baby measures less than 10th percentile on ultrasound) ?Heavy vaginal bleeding  ?Non-reassuring fetal heart rate  ?Active infection (MRSA, etc.). Group B Strep is NOT a contraindication for waterbirth.  ?If your labor has to be induced and induction method requires continuous monitoring of the baby's heart rate  ?Other risks/issues identified by your obstetrical provider  ? ?Please remember that birth is unpredictable. Under certain unforeseeable circumstances your provider may advise against giving birth in the tub. These decisions will be made on a case-by-case basis and with the safety of you and your baby as our highest priority. ? ? ?*Please remember that in order to have a waterbirth, you must test Negative to COVID-19 upon admission to the hospital. ? ?Updated 07/07/20 ? ?

## 2021-06-10 NOTE — Progress Notes (Signed)
? ?  PRENATAL VISIT NOTE ? ?Subjective:  ?Emma Robbins is a 31 y.o. G3P1011 at 52w0dbeing seen today for ongoing prenatal care.  She is currently monitored for the following issues for this high-risk pregnancy and has Acquired hypothyroidism; Adjustment disorder with mixed anxiety and depressed mood; Encounter for supervision of normal pregnancy; and Nausea and vomiting in pregnancy on their problem list. ? ?Patient reports itching on back of neck and scalp. Started about 1 week ago. Noticed small red bumps. Has not tried anything. No changes to shampoos, soaps or detergents. No one in household has this issue.  Contractions: Not present. Vag. Bleeding: None.  Movement: Present. Denies leaking of fluid.  ? ?The following portions of the patient's history were reviewed and updated as appropriate: allergies, current medications, past family history, past medical history, past social history, past surgical history and problem list.  ? ?Objective:  ? ?Vitals:  ? 06/10/21 0820  ?BP: 112/72  ?Pulse: 84  ?Weight: 213 lb (96.6 kg)  ? ?Fetal Status: Fetal Heart Rate (bpm): 144 Fundal Height: 27 cm Movement: Present    ? ?General:  Alert, oriented and cooperative. Patient is in no acute distress.  ?Skin: Skin is warm and dry. No rash noted.   ?Cardiovascular: Normal heart rate noted  ?Respiratory: Normal respiratory effort, no problems with respiration noted  ?Abdomen: Soft, gravid, appropriate for gestational age.  Pain/Pressure: Absent     ?Pelvic: Cervical exam deferred        ?Extremities: Normal range of motion.  Edema: Trace  ?Mental Status: Normal mood and affect. Normal behavior. Normal judgment and thought content.  ? ?Assessment and Plan:  ?Pregnancy: G3P1011 at 266w0d1. Supervision of other normal pregnancy, antepartum ?- Routine OB. Doing well. ?- GTT and labs today ?- Interested in waAmericusquestions answered. Pt to enroll in class, see midwives for most visits. Discussed waterbirth as option for low risk  pregnancy. Pregnancy is hard to predict and things may arise that prevent immersion in water but labor can be supported with freedom of movement, birthing ball, shower and birth can be supported in position of pt choosing, etc, even if waterbirth becomes unavailable. Pt states understanding. ?- Anticipatory guidance for upcoming appointments provided ?- Recommend Benadryl for itching. May use hydrocortisone cream on back/neck. Avoid washing hair with hot water, scratching affected areas.  ? ?- Glucose Tolerance, 2 Hours w/1 Hour ?- RPR ?- CBC ?- HIV antibody (with reflex) ? ?2. [redacted] weeks gestation of pregnancy ?- Active fetal movement ? ?3. Acquired hypothyroidism ?- Nausea/vomiting improved. Restarted Synthroid. Repeat TSH today ? ?- TSH ? ?4. Depression affecting pregnancy ?- History of PPD in prior pregnancy ?- PHQ-9 score 10. Reports feeling hopeless as she feels like she can no longer enjoy the things she use to prior to having children. She declines medication therapy but [facility]. Will make referral. ? ?- Ambulatory referral to [facility] ?Preterm labor symptoms and general obstetric precautions including but not limited to vaginal bleeding, contractions, leaking of fluid and fetal movement were reviewed in detail with the patient. ?Please refer to After Visit Summary for other counseling recommendations.  ? ?Return in about 2 weeks (around 06/24/2021). ? ?Future Appointments  ?Date Time Provider [place]?06/23/2021  2:30 PM [place], LiKathie DikeCNM [facility] None  ? ? ? ?DaRenee HarderCNM ? ?

## 2021-06-11 LAB — CBC
Hematocrit: 35 % (ref 34.0–46.6)
Hemoglobin: 12.2 g/dL (ref 11.1–15.9)
MCH: 33.4 pg — ABNORMAL HIGH (ref 26.6–33.0)
MCHC: 34.9 g/dL (ref 31.5–35.7)
MCV: 96 fL (ref 79–97)
Platelets: 272 10*3/uL (ref 150–450)
RBC: 3.65 x10E6/uL — ABNORMAL LOW (ref 3.77–5.28)
RDW: 12.3 % (ref 11.7–15.4)
WBC: 8.4 10*3/uL (ref 3.4–10.8)

## 2021-06-11 LAB — GLUCOSE TOLERANCE, 2 HOURS W/ 1HR
Glucose, 1 hour: 92 mg/dL (ref 70–179)
Glucose, 2 hour: 83 mg/dL (ref 70–152)
Glucose, Fasting: 75 mg/dL (ref 70–91)

## 2021-06-11 LAB — TSH: TSH: 4.51 u[IU]/mL — ABNORMAL HIGH (ref 0.450–4.500)

## 2021-06-11 LAB — HIV ANTIBODY (ROUTINE TESTING W REFLEX): HIV Screen 4th Generation wRfx: NONREACTIVE

## 2021-06-11 LAB — RPR: RPR Ser Ql: NONREACTIVE

## 2021-06-11 NOTE — BH Specialist Note (Signed)
Integrated Behavioral Health Initial In-Person Visit ? ?MRN: [id number] ?Name: Emma Robbins ? ?Number of [place] Clinician visits: 1 ?Session Start time:   10:20am ?Session End time: 10:55am ?Total time in minutes: 35 mins in person at [facility]  ? ?Types of Service: Individual psychotherapy ? ?Interpretor:No. Interpretor Name and Language: None  ? ? Warm Hand Off Completed. ?  ? ?  ? ? ?Subjective: ?Emma Robbins is a 31 y.o. female accompanied by n/a ?Patient was referred by Bridgette Habermann CNM for depressed mood and overwhelmed. ?Patient reports the following symptoms/concerns:  ? ?Duration of problem: approx 3 months; Severity of problem: mild ? ?Objective: ?Mood: Anxious and Affect: Appropriate ?Risk of harm to self or others: No plan to harm self or others ? ?Life Context: ?Family and Social: Lives w/spouse and 29 month old daughter  ?School/Work: full time  ?Self-Care: None ?Life Changes: Short intervals between pregnancies.  ? ?Patient and/or Family's Strengths/Protective Factors: ?Concrete supports in place (healthy food, safe environments, etc.) ? ?Goals Addressed: ?Patient will: ?Reduce symptoms of: depression ?Increase knowledge and/or ability of: coping skills  ?Demonstrate ability to: Increase healthy adjustment to current life circumstances ? ?Progress towards Goals: ?Ongoing ? ?Interventions: ?Interventions utilized: Supportive Counseling  ?Standardized Assessments completed: PHQ 9 ? ?Patient and/or Family Response: Emma Robbins responded well to visit  ? ?Assessment: ?Patient currently experiencing depression affecting pregnancy. ?  ?Patient may benefit from integrated behavioral health. ? ?Plan: ?Follow up with behavioral health clinician on : 06/23/2021 ?Behavioral recommendations: Develop a routine and self care techniques to reduce burnout, prioritize rest and communicate needs with spouse for added support  ?Referral(s): [place] (In Clinic) ?"From scale  of 1-10, how likely are you to follow plan?":  ? ?Emma Ferrier, LCSW ? ? ? ? ? ? ? ? ?

## 2021-06-17 ENCOUNTER — Other Ambulatory Visit: Payer: Medicaid Other

## 2021-06-23 ENCOUNTER — Other Ambulatory Visit: Payer: Self-pay

## 2021-06-23 ENCOUNTER — Ambulatory Visit (INDEPENDENT_AMBULATORY_CARE_PROVIDER_SITE_OTHER): Payer: Medicaid Other | Admitting: Advanced Practice Midwife

## 2021-06-23 ENCOUNTER — Ambulatory Visit (INDEPENDENT_AMBULATORY_CARE_PROVIDER_SITE_OTHER): Payer: Medicaid Other | Admitting: Licensed Clinical Social Worker

## 2021-06-23 VITALS — BP 117/73 | HR 85 | Wt 215.4 lb

## 2021-06-23 DIAGNOSIS — O99340 Other mental disorders complicating pregnancy, unspecified trimester: Secondary | ICD-10-CM | POA: Diagnosis not present

## 2021-06-23 DIAGNOSIS — Z3480 Encounter for supervision of other normal pregnancy, unspecified trimester: Secondary | ICD-10-CM

## 2021-06-23 DIAGNOSIS — O99283 Endocrine, nutritional and metabolic diseases complicating pregnancy, third trimester: Secondary | ICD-10-CM

## 2021-06-23 DIAGNOSIS — F32A Depression, unspecified: Secondary | ICD-10-CM | POA: Diagnosis not present

## 2021-06-23 DIAGNOSIS — Z3A28 28 weeks gestation of pregnancy: Secondary | ICD-10-CM

## 2021-06-23 DIAGNOSIS — E039 Hypothyroidism, unspecified: Secondary | ICD-10-CM

## 2021-06-23 MED ORDER — LEVOTHYROXINE SODIUM 50 MCG PO TABS: 50.0000 ug | ORAL_TABLET | Freq: Every day | ORAL | 5 refills | Status: DC

## 2021-06-23 NOTE — Progress Notes (Signed)
? ?  PRENATAL VISIT NOTE ? ?Subjective:  ?Emma Robbins is a 31 y.o. G3P1011 at 78w6dbeing seen today for ongoing prenatal care.  She is currently monitored for the following issues for this low-risk pregnancy and has Acquired hypothyroidism; Adjustment disorder with mixed anxiety and depressed mood; Encounter for supervision of normal pregnancy; and Nausea and vomiting in pregnancy on their problem list. ? ?Patient reports no complaints.  Contractions: Not present. Vag. Bleeding: None.  Movement: Present. Denies leaking of fluid.  ? ?The following portions of the patient's history were reviewed and updated as appropriate: allergies, current medications, past family history, past medical history, past social history, past surgical history and problem list.  ? ?Objective:  ? ?Vitals:  ? 06/23/21 1422  ?BP: 117/73  ?Pulse: 85  ?Weight: 215 lb 6.4 oz (97.7 kg)  ? ? ?Fetal Status: Fetal Heart Rate (bpm): 142 Fundal Height: 29 cm Movement: Present    ? ?General:  Alert, oriented and cooperative. Patient is in no acute distress.  ?Skin: Skin is warm and dry. No rash noted.   ?Cardiovascular: Normal heart rate noted  ?Respiratory: Normal respiratory effort, no problems with respiration noted  ?Abdomen: Soft, gravid, appropriate for gestational age.  Pain/Pressure: Present     ?Pelvic: Cervical exam deferred        ?Extremities: Normal range of motion.  Edema: Trace  ?Mental Status: Normal mood and affect. Normal behavior. Normal judgment and thought content.  ? ?Assessment and Plan:  ?Pregnancy: G3P1011 at 243w6d1. Hypothyroid in pregnancy, antepartum, third trimester ?--Restarted Synthroid on 3/15, increase dose today based on TSH of 4.5 on 3/15.   ?--Recheck TSH in 4 weeks, at 32 weeks ? ?2. Supervision of other normal pregnancy, antepartum ?--Anticipatory guidance about next visits/weeks of pregnancy given.  ?--Interested in waPearl Beachquestions answered. Pt took class and had a waterbirth with her first pregnancy.    ?--WB consent signed today ?--Discussed waterbirth as option for low risk pregnancy. Pregnancy is hard to predict and things may arise that prevent immersion in water but labor can be supported with freedom of movement, birthing ball, shower and birth can be supported in position of pt choosing, etc, even if waterbirth becomes unavailable. Pt states understanding.  ?--BTL consent signed today at pt request ? ?3. Depression affecting pregnancy ?--Doing well, appts with AnSeth Bake ?4. [redacted] weeks gestation of pregnancy ? ? ?Preterm labor symptoms and general obstetric precautions including but not limited to vaginal bleeding, contractions, leaking of fluid and fetal movement were reviewed in detail with the patient. ?Please refer to After Visit Summary for other counseling recommendations.  ? ?Return in about 2 weeks (around 07/07/2021) for LOB, Midwife preferred. ? ?Future Appointments  ?Date Time Provider [place]?07/07/2021  2:30 PM HaShelly BombardMD [facility] None  ? ? ?LiFatima BlankCNM  ?

## 2021-06-23 NOTE — Progress Notes (Signed)
Patient presents for ROB. Per last TSH result provider recommended patient increase levothyroxine to 50 mcg. Patient has been only taking 25 mcg. %0 mcg has been pended. She would also like to discuss BTL today. ?

## 2021-06-23 NOTE — Patient Instructions (Signed)
Vasectomy Info: ?[place] Vasectomy Program  ?[id number]  ? ?Considering Waterbirth? ?Guide for patients at [facility]) ?Why consider waterbirth? ?Gentle birth for babies  ?Less pain medicine used in labor  ?May allow for passive descent/less pushing  ?May reduce perineal tears  ?More mobility and instinctive maternal position changes  ?Increased maternal relaxation  ? ?Is waterbirth safe? What are the risks of infection, drowning or other complications? ?Infection:  ?Very low risk (3.7 % for tub vs 4.8% for bed)  ?7 in 28 waterbirths with documented infection  ?Poorly cleaned equipment most common cause  ?Slightly lower group B strep transmission rate  ?Drowning  ?Maternal:  ?Very low risk  ?Related to seizures or fainting  ?Newborn:  ?Very low risk. No evidence of increased risk of respiratory problems in multiple large studies  ?Physiological protection from breathing under water  ?Avoid underwater birth if there are any fetal complications  ?Once baby's head is out of the water, keep it out.  ?Birth complication  ?Some reports of cord trauma, but risk decreased by bringing baby to surface gradually  ?No evidence of increased risk of shoulder dystocia. Mothers can usually change positions faster in water than in a bed, possibly aiding the maneuvers to free the shoulder.  ? ?There are 2 things you MUST do to have a waterbirth with [facility]: ?Attend a waterbirth class at [place] at [facility]   ?3rd Wednesday of every month from 7-9 pm (virtual during [place]) ?Free ?Register online at [email or web address] or [email or web address] or by calling [phone] ?Bring [place] the certificate from the class to your prenatal appointment or send via MyChart ?Meet with a midwife at 36 weeks* to see if you can still plan a waterbirth and to sign the consent.  ? ?*We also recommend that you schedule as many of your prenatal visits with a  midwife as possible.   ? ?Helpful information: ?You may want to bring a bathing suit top to the hospital to wear during labor but this is optional.  All other supplies are provided by the hospital. ?Please arrive at the hospital with signs of active labor, and do not wait at home until late in labor. It takes 45 min- 2 hours for COVID testing, fetal monitoring, and check in to your room to take place, plus transport and filling of the waterbirth tub.   ? ?Things that would prevent you from having a waterbirth: ?Unknown or Positive COVID-19 diagnosis upon admission to hospital* ?Premature, <37wks  ?Previous cesarean birth  ?Presence of thick meconium-stained fluid  ?Multiple gestation (Twins, triplets, etc.)  ?Uncontrolled diabetes or gestational diabetes requiring medication  ?Hypertension diagnosed in pregnancy or preexisting hypertension (gestational hypertension, preeclampsia, or chronic hypertension) ?Fetal growth restriction (your baby measures less than 10th percentile on ultrasound) ?Heavy vaginal bleeding  ?Non-reassuring fetal heart rate  ?Active infection (MRSA, etc.). Group B Strep is NOT a contraindication for waterbirth.  ?If your labor has to be induced and induction method requires continuous monitoring of the baby's heart rate  ?Other risks/issues identified by your obstetrical provider  ? ?Please remember that birth is unpredictable. Under certain unforeseeable circumstances your provider may advise against giving birth in the tub. These decisions will be made on a case-by-case basis and with the safety of you and your baby as our highest priority. ? ? ?*Please remember that in order to have a waterbirth, you must test Negative to COVID-19 upon admission to the hospital. ? ?  Updated 07/07/20  ? ? ?

## 2021-06-24 NOTE — BH Specialist Note (Signed)
Integrated Behavioral Health Follow Up In-Person Visit ? ?MRN: [id number] ?Name: Emma Robbins ? ?Number of [place] Clinician visits: 1 ?Session Start time:  3:10pm ?Session End time: 3:43pm ?Total time in minutes: 33 mins at [facility]  ? ?Types of Service: Individual psychotherapy ? ?Interpretor:No. Interpretor Name and Language: none ? ?Subjective: ?Brihany Butch is a 31 y.o. female accompanied by n/a ?Patient was referred by L Amedeo Gory for depression. ?Patient reports the following symptoms/concerns: depressed mood, feeling on edge,  ?Duration of problem: ; Severity of problem: mild ? ?Objective: ?Mood: good and Affect: Appropriate ?Risk of harm to self or others: No plan to harm self or others ? ?Life Context: ?Family and Social: lives with spouse and one year old  ?School/Work: just resigned  ?Self-Care: taking breaks  ?Life Changes: new pregnancy ? ?Patient and/or Family's Strengths/Protective Factors: ?Concrete supports in place (healthy food, safe environments, etc.) ? ?Goals Addressed: ?Patient will: ? Reduce symptoms of: depression  ? Increase knowledge and/or ability of: coping skills  ? Demonstrate ability to: Increase healthy adjustment to current life circumstances ? ?Progress towards Goals: ?Ongoing ? ?Interventions: ?Interventions utilized:  Supportive Counseling ?Standardized Assessments completed: PHQ 9 ? ?Patient and/or Family Response: Ms. Zacher responded well to visit  ? ?Assessment: ?Patient currently experiencing depression affecting pregnancy.  ? ?Patient may benefit from integrated behavioral health. ? ?Plan: ?Follow up with behavioral health clinician on : prn ?Behavioral recommendations: Develop a routine and self care techniques to reduce burnout, prioritize rest and communicate needs with spouse for added support  ?Referral(s): [place] (In Clinic) ?"From scale of 1-10, how likely are you to follow plan?":  ? ?Lynnea Ferrier,  LCSW ? ? ?

## 2021-07-07 ENCOUNTER — Encounter: Payer: Medicaid Other | Admitting: Obstetrics

## 2021-07-14 ENCOUNTER — Inpatient Hospital Stay (HOSPITAL_COMMUNITY)
Admission: AD | Admit: 2021-07-14 | Discharge: 2021-07-14 | Disposition: A | Discharge status: Home / Self Care | Payer: Medicaid Other | Attending: Obstetrics and Gynecology | Admitting: Obstetrics and Gynecology

## 2021-07-14 ENCOUNTER — Encounter (HOSPITAL_COMMUNITY): Payer: Self-pay | Admitting: Obstetrics and Gynecology

## 2021-07-14 ENCOUNTER — Inpatient Hospital Stay (HOSPITAL_BASED_OUTPATIENT_CLINIC_OR_DEPARTMENT_OTHER): Payer: Medicaid Other

## 2021-07-14 ENCOUNTER — Other Ambulatory Visit: Payer: Self-pay

## 2021-07-14 ENCOUNTER — Telehealth: Payer: Self-pay | Admitting: Emergency Medicine

## 2021-07-14 DIAGNOSIS — Z3A31 31 weeks gestation of pregnancy: Secondary | ICD-10-CM

## 2021-07-14 DIAGNOSIS — O368130 Decreased fetal movements, third trimester, not applicable or unspecified: Secondary | ICD-10-CM | POA: Insufficient documentation

## 2021-07-14 DIAGNOSIS — E039 Hypothyroidism, unspecified: Secondary | ICD-10-CM | POA: Diagnosis not present

## 2021-07-14 DIAGNOSIS — O321XX0 Maternal care for breech presentation, not applicable or unspecified: Secondary | ICD-10-CM

## 2021-07-14 DIAGNOSIS — Z3689 Encounter for other specified antenatal screening: Secondary | ICD-10-CM

## 2021-07-14 DIAGNOSIS — O99283 Endocrine, nutritional and metabolic diseases complicating pregnancy, third trimester: Secondary | ICD-10-CM

## 2021-07-14 NOTE — Telephone Encounter (Signed)
TC from patient to nurse advise line. Patient states that she has felt NO fetal movement since 5pm yesterday. Denies abd pain, cramping or contractions, LOF, or vaginal bleeding. Pt endorses low back pain that feels like sciatica. ? ?Pt instructed to present to MAU immediately for evaluation. Pt agreed. ?

## 2021-07-14 NOTE — MAU Note (Signed)
.  Emma Robbins is a 31 y.o. at 37w6dhere in MAU reporting: has not felt baby move much since yesterday. Denies any pain or cramping . No vag bleeding or leaking noted.  ? ?Onset of complaint: last night ?Pain score: 0 ? ?Vitals:  ? 07/14/21 1607  ?BP: 96/61  ?Resp: 18  ?Temp: 98.2 ?F (36.8 ?C)  ?   ?FHT:140( started feeling baby move in triage) ?Lab orders placed from triage:   ? ?

## 2021-07-14 NOTE — MAU Provider Note (Signed)
?History  ?  ? ?CSN: [id number] ? ?Arrival date and time: 07/14/21 1550 ? ? Event Date/Time  ? First Provider Initiated Contact with Patient 07/14/21 1619   ?  ? ?Chief Complaint  ?Patient presents with  ? Decreased Fetal Movement  ? ?Emma Robbins is a 31 y.o. G3P1011 at 105w6dwho presents today with decreased fetal movement. She states that last night around 5pm was the last time she felt the baby move. She tried to do some things to elicit movemenmt today, but the baby did not move. Now since she has arrived here she has started feeling movement. She denies any contractions, VB or  LOF. She has some back pain yesterday but not today.  ? ? ?OB History   ? ? Gravida  ?3  ? Para  ?1  ? Term  ?1  ? Preterm  ?   ? AB  ?1  ? Living  ?1  ?  ? ? SAB  ?1  ? IAB  ?   ? Ectopic  ?   ? Multiple  ?0  ? Live Births  ?1  ?   ?  ?  ? ? ?Past Medical History:  ?Diagnosis Date  ? Anxiety   ? Depression   ? Gallstone 11/2011  ? Headache(784.0)   ? unspecified - 2-3 x/week  ? Hemorrhoids   ? Thyroid disease   ? ? ?Past Surgical History:  ?Procedure Laterality Date  ? BREAST SURGERY  2012  ? Fibroadenoma  ? CHOLECYSTECTOMY  12/09/2011  ? Procedure: LAPAROSCOPIC CHOLECYSTECTOMY;  Surgeon: CHaywood Lasso MD;  [facility]  Service: General;  Laterality: N/A;  ? LAPAROSCOPIC OVARIAN CYSTECTOMY  12/16/10  ? Right cystadenofibroma  ? ? ?Family History  ?Problem Relation Age of Onset  ? Hyperlipidemia Father   ? Thyroid disease Father   ? Diabetes Father   ? Thyroid disease Brother   ? Thyroid disease Sister   ? Hypertension Maternal Grandmother   ? Diabetes Paternal Grandmother   ? Hyperlipidemia Paternal Grandmother   ? Cancer Paternal Grandfather   ?     Prostate  ? ? ?Social History  ? ?Tobacco Use  ? Smoking status: Never  ? Smokeless tobacco: Never  ?Vaping Use  ? Vaping Use: Never used  ?Substance Use Topics  ? Alcohol use: Not Currently  ?  Alcohol/week: 3.0 standard drinks  ?  Types: 3 Standard drinks or  equivalent per week  ?  Comment: Rare  ? Drug use: Not Currently  ?  Types: Cocaine  ?  Comment: per pt did Cocaine 2 yrs ago.05-2017  ? ? ?Allergies: No Known Allergies ? ?Medications Prior to Admission  ?Medication Sig Dispense Refill Last Dose  ? levothyroxine (SYNTHROID) 50 MCG tablet Take 1 tablet (50 mcg total) by mouth daily before breakfast. 30 tablet 5 07/13/2021  ? Prenatal Vit-Fe Phos-FA-Omega (VITAFOL GUMMIES) 3.33-0.333-34.8 MG CHEW Chew 3 tablets by mouth daily. 90 tablet 6 07/13/2021  ? Elastic Bandages & Supports (COMFORT FIT MATERNITY SUPP LG) MISC 1 Units by Does not apply route daily. 1 each 0   ? levothyroxine (EUTHYROX) 25 MCG tablet Take 1 tablet (25 mcg total) by mouth daily before breakfast. 30 tablet 11   ? ? ?Review of Systems  ?All other systems reviewed and are negative. ?Physical Exam  ? ?Blood pressure 96/61, temperature 98.2 ?F (36.8 ?C), resp. rate 18, height '5\' 4"'$  (1.626 m), weight 98.4 kg, last menstrual period 11/20/2020, not currently  breastfeeding. ? ?Physical Exam ?Constitutional:   ?   Appearance: She is well-developed.  ?HENT:  ?   Head: Normocephalic.  ?Eyes:  ?   Pupils: Pupils are equal, round, and reactive to light.  ?Cardiovascular:  ?   Rate and Rhythm: Normal rate and regular rhythm.  ?   Heart sounds: Normal heart sounds.  ?Pulmonary:  ?   Effort: Pulmonary effort is normal. No respiratory distress.  ?   Breath sounds: Normal breath sounds.  ?Abdominal:  ?   Palpations: Abdomen is soft.  ?   Tenderness: There is no abdominal tenderness.  ?Genitourinary: ?   Vagina: No bleeding. Vaginal discharge: mucusy. ?Musculoskeletal:     ?   General: Normal range of motion.  ?   Cervical back: Normal range of motion and neck supple.  ?Skin: ?   General: Skin is warm and dry.  ?Neurological:  ?   Mental Status: She is alert and oriented to person, place, and time.  ?Psychiatric:     ?   Mood and Affect: Mood normal.     ?   Behavior: Behavior normal.  ? ? ?NST:  ?Baseline:  135 ?Variability: moderate ?Accels: 15x15 ?Decels: none ?Toco: none ?Reactive/Appropriate for GA ? ? ?MAU Course  ?Procedures ? ?MDM ? ?BPP: 10/10  ? ? ?Assessment and Plan  ? ?1. Decreased fetal movements in third trimester, single or unspecified fetus   ?2. NST (non-stress test) reactive   ? ?DC home in stable condition  ?3rd Trimester precautions  ?PTL precautions  ?Fetal kick counts ?RX: none  ?Return to MAU as needed ?FU with OB as planned ? ? Follow-up Information   ? ? [place] Follow up.   ?Contact information: ?[address] ?[place] [address] ?[phone] ? ?  ?  ? ?  ?  ? ?  ? ?Marcille Buffy DNP, CNM  ?07/14/21  7:31 PM  ? ?

## 2021-07-20 ENCOUNTER — Encounter: Payer: Self-pay | Admitting: Family Medicine

## 2021-07-20 ENCOUNTER — Ambulatory Visit (INDEPENDENT_AMBULATORY_CARE_PROVIDER_SITE_OTHER): Payer: Medicaid Other | Admitting: Family Medicine

## 2021-07-20 VITALS — BP 120/76 | HR 83 | Wt 216.0 lb

## 2021-07-20 DIAGNOSIS — Z3A32 32 weeks gestation of pregnancy: Secondary | ICD-10-CM

## 2021-07-20 DIAGNOSIS — O321XX0 Maternal care for breech presentation, not applicable or unspecified: Secondary | ICD-10-CM

## 2021-07-20 DIAGNOSIS — Z3480 Encounter for supervision of other normal pregnancy, unspecified trimester: Secondary | ICD-10-CM

## 2021-07-20 DIAGNOSIS — E039 Hypothyroidism, unspecified: Secondary | ICD-10-CM

## 2021-07-20 NOTE — Progress Notes (Signed)
ROB she wants to know about her Water birth delivery with her baby being breeched now. ?

## 2021-07-20 NOTE — Progress Notes (Signed)
? ? ?  Subjective:  ?Emma Robbins is a 31 y.o. G3P1011 at 66w5dbeing seen today for ongoing prenatal care.  She is currently monitored for the following issues for this low-risk pregnancy and has Acquired hypothyroidism; Adjustment disorder with mixed anxiety and depressed mood; Encounter for supervision of normal pregnancy; and Nausea and vomiting in pregnancy on their problem list. ? ?Patient reports no complaints.  Breech during recent BPP due to decreased fetal movement. Wants to know options she needs to consider for future delivery. Contractions: Not present. Vag. Bleeding: None.  Movement: Present. Denies leaking of fluid.  ? ?The following portions of the patient's history were reviewed and updated as appropriate: allergies, current medications, past family history, past medical history, past social history, past surgical history and problem list.  ? ?Objective:  ? ?Vitals:  ? 07/20/21 0918  ?BP: 120/76  ?Pulse: 83  ?Weight: 216 lb (98 kg)  ? ? ?Fetal Status: Fetal Heart Rate (bpm): 143 Fundal Height: 32 cm Movement: Present    ? ?General:  Alert, oriented and cooperative. Patient is in no acute distress.  ?Skin: Skin is warm and dry. No rash noted.   ?Cardiovascular: Normal heart rate noted  ?Respiratory: Normal respiratory effort, no problems with respiration noted  ?Abdomen: Soft, gravid, appropriate for gestational age. Pain/Pressure: Absent     ?Pelvic:  Cervical exam deferred        ?Extremities: Normal range of motion.  Edema: None  ?Mental Status: Normal mood and affect. Normal behavior. Normal judgment and thought content.  ? ? ?Assessment and Plan:  ?Pregnancy: G3P1011 at 337w5d ?1. Supervision of other normal pregnancy, antepartum ?Doing well with normal fetal movement now (recent MAU visit for DFM).  ? ?2. [redacted] weeks gestation of pregnancy ? ?3. Breech presentation, single or unspecified fetus ?Still feels in breech presentation with leopold's today. Discussed implications of breech  presentation; discussed that the fetus can spontaneously turn to cephalic presentation.  Other interventions could be external cephalic version which is done in the hospital later in pregnancy vs moxibustion vs planned cesarean delivery.  Also referred to the website spinningbabies.org, this can go through maneuvers that could help. Risks/benefits of all modalities discussed. Discussed that water birth may still not be possibility if she returns to cephalic but with unstable lie, however stated this could be discussed further with CNM if this happens to be the case. She is going to continue considering her options.  ? ?4. Acquired hypothyroidism ?On synthroid with dose increased on 3/28.  ?- TSH ? ?Preterm labor symptoms and general obstetric precautions including but not limited to vaginal bleeding, contractions, leaking of fluid and fetal movement were reviewed in detail with the patient. ?Please refer to After Visit Summary for other counseling recommendations.  ? ?Return in about 2 weeks (around 08/03/2021) for ROPunta Rassa ? ? ?BePatriciaann ClanDO ?

## 2021-07-21 LAB — TSH: TSH: 4.27 u[IU]/mL (ref 0.450–4.500)

## 2021-07-27 ENCOUNTER — Other Ambulatory Visit: Payer: Self-pay | Admitting: Family Medicine

## 2021-07-27 DIAGNOSIS — E039 Hypothyroidism, unspecified: Secondary | ICD-10-CM

## 2021-07-27 MED ORDER — LEVOTHYROXINE SODIUM 75 MCG PO TABS: 75.0000 ug | ORAL_TABLET | Freq: Every day | ORAL | 1 refills | Status: DC

## 2021-08-03 ENCOUNTER — Ambulatory Visit (INDEPENDENT_AMBULATORY_CARE_PROVIDER_SITE_OTHER): Payer: Medicaid Other | Admitting: Obstetrics and Gynecology

## 2021-08-03 ENCOUNTER — Encounter: Payer: Self-pay | Admitting: Obstetrics and Gynecology

## 2021-08-03 VITALS — BP 106/71 | HR 91 | Wt 219.0 lb

## 2021-08-03 DIAGNOSIS — O321XX0 Maternal care for breech presentation, not applicable or unspecified: Secondary | ICD-10-CM

## 2021-08-03 DIAGNOSIS — E039 Hypothyroidism, unspecified: Secondary | ICD-10-CM

## 2021-08-03 DIAGNOSIS — Z3483 Encounter for supervision of other normal pregnancy, third trimester: Secondary | ICD-10-CM

## 2021-08-03 DIAGNOSIS — Z3009 Encounter for other general counseling and advice on contraception: Secondary | ICD-10-CM | POA: Insufficient documentation

## 2021-08-03 DIAGNOSIS — O329XX0 Maternal care for malpresentation of fetus, unspecified, not applicable or unspecified: Secondary | ICD-10-CM | POA: Insufficient documentation

## 2021-08-03 NOTE — Patient Instructions (Signed)

## 2021-08-03 NOTE — Progress Notes (Signed)
Subjective:  ?Emma Robbins is a 31 y.o. G3P1011 at 13w5dbeing seen today for ongoing prenatal care.  She is currently monitored for the following issues for this high-risk pregnancy and has Acquired hypothyroidism; Adjustment disorder with mixed anxiety and depressed mood; Encounter for supervision of normal pregnancy; Nausea and vomiting in pregnancy; Unwanted fertility; and Malpresentation of fetus on their problem list. ? ?Patient reports no complaints.  Contractions: Irregular. Vag. Bleeding: None.  Movement: Present. Denies leaking of fluid.  ? ?The following portions of the patient's history were reviewed and updated as appropriate: allergies, current medications, past family history, past medical history, past social history, past surgical history and problem list. Problem list updated. ? ?Objective:  ? ?Vitals:  ? 08/03/21 0832  ?BP: 106/71  ?Pulse: 91  ?Weight: 219 lb (99.3 kg)  ? ? ?Fetal Status: Fetal Heart Rate (bpm): 130   Movement: Present    ? ?General:  Alert, oriented and cooperative. Patient is in no acute distress.  ?Skin: Skin is warm and dry. No rash noted.   ?Cardiovascular: Normal heart rate noted  ?Respiratory: Normal respiratory effort, no problems with respiration noted  ?Abdomen: Soft, gravid, appropriate for gestational age. Pain/Pressure: Present     ?Pelvic:  Cervical exam deferred        ?Extremities: Normal range of motion.  Edema: None  ?Mental Status: Normal mood and affect. Normal behavior. Normal judgment and thought content.  ? ?Urinalysis:     ? ?Assessment and Plan:  ?Pregnancy: G3P1011 at 328w5d ?1. Encounter for supervision of other normal pregnancy in third trimester ?Stable ?GBS next visit ? ?2. Acquired hypothyroidism ?TSH in June ?Continue with current treatment ? ?3. Unwanted fertility ?BTL papers signed ? ?4. Breech presentation, single or unspecified fetus ?Briefly discussed Tx opitions ?Will check growth scan ?Discuss final Tx options at next viasit ?- [place] OB  FOLLOW UP; Future ? ?Preterm labor symptoms and general obstetric precautions including but not limited to vaginal bleeding, contractions, leaking of fluid and fetal movement were reviewed in detail with the patient. ?Please refer to After Visit Summary for other counseling recommendations.  ?Return in about 2 weeks (around 08/17/2021) for OB visit, face to face, MD only. ? ? ?ErChancy MilroyMD ?

## 2021-08-03 NOTE — Progress Notes (Signed)
?   Repeat u/s for presentation, has been breech.  ?

## 2021-08-18 ENCOUNTER — Encounter: Payer: Self-pay | Admitting: Obstetrics & Gynecology

## 2021-08-18 ENCOUNTER — Ambulatory Visit (INDEPENDENT_AMBULATORY_CARE_PROVIDER_SITE_OTHER): Payer: Medicaid Other | Admitting: Obstetrics & Gynecology

## 2021-08-18 ENCOUNTER — Other Ambulatory Visit (HOSPITAL_COMMUNITY)
Admission: RE | Admit: 2021-08-18 | Discharge: 2021-08-18 | Disposition: A | Discharge status: Home / Self Care | Payer: Medicaid Other | Source: Ambulatory Visit | Attending: Obstetrics & Gynecology | Admitting: Obstetrics & Gynecology

## 2021-08-18 VITALS — BP 108/72 | HR 88 | Wt 225.9 lb

## 2021-08-18 DIAGNOSIS — Z3A36 36 weeks gestation of pregnancy: Secondary | ICD-10-CM

## 2021-08-18 DIAGNOSIS — O322XX0 Maternal care for transverse and oblique lie, not applicable or unspecified: Secondary | ICD-10-CM

## 2021-08-18 DIAGNOSIS — O23593 Infection of other part of genital tract in pregnancy, third trimester: Secondary | ICD-10-CM | POA: Insufficient documentation

## 2021-08-18 DIAGNOSIS — B3731 Acute candidiasis of vulva and vagina: Secondary | ICD-10-CM | POA: Diagnosis not present

## 2021-08-18 DIAGNOSIS — Z3483 Encounter for supervision of other normal pregnancy, third trimester: Secondary | ICD-10-CM

## 2021-08-18 NOTE — Patient Instructions (Addendum)
Return to office for any scheduled appointments. Call the office or go to the MAU at [place] at [facility] if: You begin to have strong, frequent contractions Your water breaks.  Sometimes it is a big gush of fluid, sometimes it is just a trickle that keeps getting your underwear wet or running down your legs You have vaginal bleeding.  It is normal to have a small amount of spotting if your cervix was checked.  You do not feel your baby moving like normal.  If you do not, get something to eat and drink and lay down and focus on feeling your baby move.   If your baby is still not moving like normal, you should call the office or go to MAU. Any other obstetric concerns.  To help turn baby around  [place]  Website [email or web address], this can go through maneuvers that could help.

## 2021-08-18 NOTE — Progress Notes (Signed)
   PRENATAL VISIT NOTE  Subjective:  Emma Robbins is a 31 y.o. G3P1011 at 62w6dbeing seen today for ongoing prenatal care.  She is currently monitored for the following issues for this low-risk pregnancy and has Acquired hypothyroidism; Adjustment disorder with mixed anxiety and depressed mood; Encounter for supervision of normal pregnancy; Nausea and vomiting in pregnancy; Unwanted fertility; and Malpresentation of fetus on their problem list.  Patient reports burning in the vaginal area and discharge x 2 days.  Contractions: Irritability. Vag. Bleeding: None.  Movement: Present. Denies leaking of fluid.   The following portions of the patient's history were reviewed and updated as appropriate: allergies, current medications, past family history, past medical history, past social history, past surgical history and problem list.   Objective:   Vitals:   08/18/21 0858  BP: 108/72  Pulse: 88  Weight: 225 lb 14.4 oz (102.5 kg)    Fetal Status: Fetal Heart Rate (bpm): 150 Fundal Height: 36 cm Movement: Present  Presentation: Transverse  General:  Alert, oriented and cooperative. Patient is in no acute distress.  Skin: Skin is warm and dry. No rash noted.   Cardiovascular: Normal heart rate noted  Respiratory: Normal respiratory effort, no problems with respiration noted  Abdomen: Soft, gravid, appropriate for gestational age.  Pain/Pressure: Present     Pelvic: Cervical exam performed in the presence of a chaperone Dilation: Closed Effacement (%): Thick Station: Ballotable. Mild erythema noted on vulva, white discharge seen. Pelvic cultures obtained.  Extremities: Normal range of motion.  Edema: Trace  Mental Status: Normal mood and affect. Normal behavior. Normal judgment and thought content.   Assessment and Plan:  Pregnancy: G3P1011 at 343w6d. Transverse lie of fetus, single or unspecified fetus Confirmed on ultrasound today, was complete breech last week.  Discussed implications  of non-vertex presentation; discussed that the fetus can spontaneously turn to cephalic presentation.  Other interventions could be external cephalic version which is done in the hospital vs moxibustion vs planned cesarean delivery.  Also referred to the website [email or web address], this can go through maneuvers that could help. Risks/benefits of all modalities discussed in detail. Patient declines ECV, wants to be scheduled for CS but will try maneuvers in the interim. Information was given to her to review at home. Surgical order sent for CS, patient assured that fetal presentation will be rechecked every week and before CS to confirm presentation.   2. Vaginitis affecting pregnancy in third trimester, antepartum - Cervicovaginal ancillary only( [place], will follow up results and manage accordingly.  3. [redacted] weeks gestation of pregnancy 4. Encounter for supervision of other normal pregnancy in third trimester Pelvic cultures done, will follow up results and manage accordingly. - Culture, beta strep (group b only) - Cervicovaginal ancillary only( [place])  Labor symptoms and general obstetric precautions including but not limited to vaginal bleeding, contractions, leaking of fluid and fetal movement were reviewed in detail with the patient. Please refer to After Visit Summary for other counseling recommendations.   Return in about 1 week (around 08/25/2021) for OFFICE OB VISIT (MD or APP).  Future Appointments  Date Time Provider [place]5/31/2023  2:30 PM HaShelly BombardMD [facility] None  09/02/2021  2:45 PM [facility] NURSE [facility]  3:00 PM [facility] US1 [facility]  UgVerita SchneidersMD

## 2021-08-19 LAB — CERVICOVAGINAL ANCILLARY ONLY
Bacterial Vaginitis (gardnerella): NEGATIVE
Candida Glabrata: NEGATIVE
Candida Vaginitis: POSITIVE — AB
Chlamydia: NEGATIVE
Comment: NEGATIVE
Comment: NEGATIVE
Comment: NEGATIVE
Comment: NEGATIVE
Comment: NEGATIVE
Comment: NORMAL
Neisseria Gonorrhea: NEGATIVE
Trichomonas: NEGATIVE

## 2021-08-19 MED ORDER — TERCONAZOLE 0.8 % VA CREA: 1.0000 | TOPICAL_CREAM | Freq: Every day | VAGINAL | 0 refills | Status: DC

## 2021-08-19 NOTE — Addendum Note (Signed)
Addended by: Verita Schneiders A on: 08/19/2021 02:34 PM   Modules accepted: Orders

## 2021-08-20 ENCOUNTER — Telehealth (HOSPITAL_COMMUNITY): Payer: Self-pay | Admitting: *Deleted

## 2021-08-20 ENCOUNTER — Encounter (HOSPITAL_COMMUNITY): Payer: Self-pay

## 2021-08-20 NOTE — Telephone Encounter (Signed)
Preadmission screen  

## 2021-08-20 NOTE — Patient Instructions (Signed)
Emma Robbins  08/20/2021   Your procedure is scheduled on:  09/03/2021  Arrive at 70 at Entrance C on [address] at [facility]  and [address]. You are invited to use the FREE valet parking or use the Visitor's parking deck.  Pick up the phone at the desk and dial [phone].  Call this number if you have problems the morning of surgery: [phone]  Remember:   Do not eat food:(After Midnight) Desps de medianoche.  Do not drink clear liquids: (After Midnight) Desps de medianoche.  Take these medicines the morning of surgery with A SIP OF WATER:  Take levothyroxine as prescribed   Do not wear jewelry, make-up or nail polish.  Do not wear lotions, powders, or perfumes. Do not wear deodorant.  Do not shave 48 hours prior to surgery.  Do not bring valuables to the hospital.  [facility] is not   responsible for any belongings or valuables brought to the hospital.  Contacts, dentures or bridgework may not be worn into surgery.  Leave suitcase in the car. After surgery it may be brought to your room.  For patients admitted to the hospital, checkout time is 11:00 AM the day of              discharge.      Please read over the following fact sheets that you were given:     Preparing for Surgery

## 2021-08-21 ENCOUNTER — Encounter (HOSPITAL_COMMUNITY): Payer: Self-pay

## 2021-08-22 LAB — CULTURE, BETA STREP (GROUP B ONLY): Strep Gp B Culture: NEGATIVE

## 2021-08-26 ENCOUNTER — Ambulatory Visit (INDEPENDENT_AMBULATORY_CARE_PROVIDER_SITE_OTHER): Payer: Medicaid Other

## 2021-08-26 ENCOUNTER — Encounter: Payer: Self-pay | Admitting: Obstetrics

## 2021-08-26 ENCOUNTER — Ambulatory Visit (INDEPENDENT_AMBULATORY_CARE_PROVIDER_SITE_OTHER): Payer: Medicaid Other | Admitting: Obstetrics

## 2021-08-26 VITALS — BP 116/73 | HR 94 | Wt 224.0 lb

## 2021-08-26 DIAGNOSIS — Z3483 Encounter for supervision of other normal pregnancy, third trimester: Secondary | ICD-10-CM

## 2021-08-26 DIAGNOSIS — Z3A38 38 weeks gestation of pregnancy: Secondary | ICD-10-CM

## 2021-08-26 DIAGNOSIS — Z3689 Encounter for other specified antenatal screening: Secondary | ICD-10-CM | POA: Diagnosis not present

## 2021-08-26 NOTE — Progress Notes (Signed)
Pt reports fetal movement with some pressure. 

## 2021-08-26 NOTE — Progress Notes (Signed)
Subjective:  Emma Robbins is a 31 y.o. G3P1011 at 11w0dbeing seen today for ongoing prenatal care.  She is currently monitored for the following issues for this low-risk pregnancy and has Acquired hypothyroidism; Adjustment disorder with mixed anxiety and depressed mood; Encounter for supervision of normal pregnancy; Nausea and vomiting in pregnancy; Unwanted fertility; and Malpresentation of fetus on their problem list.  Patient reports no complaints.  Contractions: Not present. Vag. Bleeding: None.  Movement: Present. Denies leaking of fluid.   The following portions of the patient's history were reviewed and updated as appropriate: allergies, current medications, past family history, past medical history, past social history, past surgical history and problem list. Problem list updated.  Objective:   Vitals:   08/26/21 1441  BP: 116/73  Pulse: 94  Weight: 224 lb (101.6 kg)    Fetal Status: Fetal Heart Rate (bpm): 131   Movement: Present     General:  Alert, oriented and cooperative. Patient is in no acute distress.  Skin: Skin is warm and dry. No rash noted.   Cardiovascular: Normal heart rate noted  Respiratory: Normal respiratory effort, no problems with respiration noted  Abdomen: Soft, gravid, appropriate for gestational age. Pain/Pressure: Present     Pelvic:  Cervical exam deferred        Extremities: Normal range of motion.  Edema: Trace  Mental Status: Normal mood and affect. Normal behavior. Normal judgment and thought content.   Urinalysis:      Assessment and Plan:  Pregnancy: G3P1011 at 343w0d1. Encounter for supervision of other normal pregnancy in third trimester  2. Determine fetal presentation using ultrasound Rx: - [place]:   Transverse Lie.  Head on Maternal Right [facility]. - C/S scheduled for 09-03-21 if still transverse lie.  I discussed version, followed by IOL with her, briefly.   Term labor symptoms  and general obstetric precautions including but not limited to vaginal bleeding, contractions, leaking of fluid and fetal movement were reviewed in detail with the patient. Please refer to After Visit Summary for other counseling recommendations.   Return in about 1 week (around 09/02/2021) for ROB.   HaShelly BombardMD  08/26/21

## 2021-09-01 ENCOUNTER — Encounter (HOSPITAL_COMMUNITY)
Admission: RE | Admit: 2021-09-01 | Discharge: 2021-09-01 | Disposition: A | Discharge status: Home / Self Care | Payer: Medicaid Other | Source: Ambulatory Visit | Attending: Obstetrics and Gynecology | Admitting: Obstetrics and Gynecology

## 2021-09-01 DIAGNOSIS — Z01812 Encounter for preprocedural laboratory examination: Secondary | ICD-10-CM | POA: Insufficient documentation

## 2021-09-01 DIAGNOSIS — O321XX0 Maternal care for breech presentation, not applicable or unspecified: Secondary | ICD-10-CM | POA: Insufficient documentation

## 2021-09-01 HISTORY — DX: Hypothyroidism, unspecified: E03.9

## 2021-09-01 LAB — CBC
HCT: 34.3 % — ABNORMAL LOW (ref 36.0–46.0)
Hemoglobin: 11.8 g/dL — ABNORMAL LOW (ref 12.0–15.0)
MCH: 32.5 pg (ref 26.0–34.0)
MCHC: 34.4 g/dL (ref 30.0–36.0)
MCV: 94.5 fL (ref 80.0–100.0)
Platelets: 240 10*3/uL (ref 150–400)
RBC: 3.63 MIL/uL — ABNORMAL LOW (ref 3.87–5.11)
RDW: 13 % (ref 11.5–15.5)
WBC: 8.6 10*3/uL (ref 4.0–10.5)
nRBC: 0 % (ref 0.0–0.2)

## 2021-09-01 LAB — TYPE AND SCREEN
ABO/RH(D): O POS
Antibody Screen: NEGATIVE

## 2021-09-01 LAB — RPR: RPR Ser Ql: NONREACTIVE

## 2021-09-02 ENCOUNTER — Ambulatory Visit: Payer: Medicaid Other | Admitting: *Deleted

## 2021-09-02 ENCOUNTER — Ambulatory Visit: Payer: Medicaid Other | Attending: Obstetrics and Gynecology

## 2021-09-02 VITALS — BP 112/70 | HR 86

## 2021-09-02 DIAGNOSIS — E039 Hypothyroidism, unspecified: Secondary | ICD-10-CM | POA: Diagnosis not present

## 2021-09-02 DIAGNOSIS — Z3483 Encounter for supervision of other normal pregnancy, third trimester: Secondary | ICD-10-CM

## 2021-09-02 DIAGNOSIS — O99283 Endocrine, nutritional and metabolic diseases complicating pregnancy, third trimester: Secondary | ICD-10-CM | POA: Diagnosis not present

## 2021-09-02 DIAGNOSIS — Z3A39 39 weeks gestation of pregnancy: Secondary | ICD-10-CM | POA: Insufficient documentation

## 2021-09-02 DIAGNOSIS — O321XX0 Maternal care for breech presentation, not applicable or unspecified: Secondary | ICD-10-CM | POA: Diagnosis not present

## 2021-09-02 NOTE — Progress Notes (Signed)
Pt reports very wet undies last night with green snot like substance. This happened twice. Hasnt happened today at all.

## 2021-09-03 ENCOUNTER — Inpatient Hospital Stay (HOSPITAL_COMMUNITY): Payer: Medicaid Other | Admitting: Anesthesiology

## 2021-09-03 ENCOUNTER — Other Ambulatory Visit: Payer: Self-pay

## 2021-09-03 ENCOUNTER — Encounter (HOSPITAL_COMMUNITY)
Admission: RE | Disposition: A | Discharge status: Home / Self Care | Payer: Self-pay | Source: Home / Self Care | Attending: Family Medicine

## 2021-09-03 ENCOUNTER — Encounter (HOSPITAL_COMMUNITY): Payer: Self-pay | Admitting: Obstetrics and Gynecology

## 2021-09-03 ENCOUNTER — Inpatient Hospital Stay (HOSPITAL_COMMUNITY)
Admission: RE | Admit: 2021-09-03 | Discharge: 2021-09-05 | DRG: 785 | Disposition: A | Discharge status: Home / Self Care | Payer: Medicaid Other | Attending: Family Medicine | Admitting: Family Medicine

## 2021-09-03 DIAGNOSIS — Z3A39 39 weeks gestation of pregnancy: Secondary | ICD-10-CM

## 2021-09-03 DIAGNOSIS — O99284 Endocrine, nutritional and metabolic diseases complicating childbirth: Secondary | ICD-10-CM

## 2021-09-03 DIAGNOSIS — Z3490 Encounter for supervision of normal pregnancy, unspecified, unspecified trimester: Secondary | ICD-10-CM

## 2021-09-03 DIAGNOSIS — E039 Hypothyroidism, unspecified: Secondary | ICD-10-CM | POA: Diagnosis present

## 2021-09-03 DIAGNOSIS — O321XX0 Maternal care for breech presentation, not applicable or unspecified: Principal | ICD-10-CM | POA: Diagnosis present

## 2021-09-03 DIAGNOSIS — Z302 Encounter for sterilization: Secondary | ICD-10-CM | POA: Diagnosis not present

## 2021-09-03 DIAGNOSIS — O329XX1 Maternal care for malpresentation of fetus, unspecified, fetus 1: Secondary | ICD-10-CM | POA: Diagnosis present

## 2021-09-03 DIAGNOSIS — Z3009 Encounter for other general counseling and advice on contraception: Secondary | ICD-10-CM | POA: Diagnosis present

## 2021-09-03 SURGERY — Surgical Case
Anesthesia: Spinal

## 2021-09-03 MED ORDER — DIPHENHYDRAMINE HCL 25 MG PO CAPS: 25.0000 mg | ORAL_CAPSULE | Freq: Four times a day (QID) | ORAL | Status: DC | PRN

## 2021-09-03 MED ORDER — PHENYLEPHRINE HCL-NACL 20-0.9 MG/250ML-% IV SOLN
INTRAVENOUS | Status: DC | PRN
  Administered 2021-09-03: 60 ug/min via INTRAVENOUS

## 2021-09-03 MED ORDER — SIMETHICONE 80 MG PO CHEW
80.0000 mg | CHEWABLE_TABLET | Freq: Three times a day (TID) | ORAL | Status: DC
  Administered 2021-09-03 – 2021-09-05 (×5): 80 mg via ORAL
  Filled 2021-09-03 (×5): qty 1

## 2021-09-03 MED ORDER — SODIUM CHLORIDE 0.9 % IR SOLN
Status: DC | PRN
  Administered 2021-09-03: 1

## 2021-09-03 MED ORDER — DIPHENHYDRAMINE HCL 50 MG/ML IJ SOLN: 12.5000 mg | INTRAMUSCULAR | Status: DC | PRN

## 2021-09-03 MED ORDER — IBUPROFEN 600 MG PO TABS: 600.0000 mg | ORAL_TABLET | Freq: Four times a day (QID) | ORAL | Status: DC

## 2021-09-03 MED ORDER — SODIUM CHLORIDE 0.9% FLUSH: 3.0000 mL | INTRAVENOUS | Status: DC | PRN

## 2021-09-03 MED ORDER — GABAPENTIN 300 MG PO CAPS
ORAL_CAPSULE | ORAL | Status: AC
  Filled 2021-09-03: qty 1

## 2021-09-03 MED ORDER — WITCH HAZEL-GLYCERIN EX PADS
1.0000 | MEDICATED_PAD | CUTANEOUS | Status: DC | PRN
Start: 2021-09-03 — End: 2021-09-05

## 2021-09-03 MED ORDER — GABAPENTIN 300 MG PO CAPS
300.0000 mg | ORAL_CAPSULE | ORAL | Status: AC
  Administered 2021-09-03: 300 mg via ORAL

## 2021-09-03 MED ORDER — MENTHOL 3 MG MT LOZG
1.0000 | LOZENGE | OROMUCOSAL | Status: DC | PRN
  Filled 2021-09-03: qty 9

## 2021-09-03 MED ORDER — MORPHINE SULFATE (PF) 0.5 MG/ML IJ SOLN
INTRAMUSCULAR | Status: DC | PRN
  Administered 2021-09-03: 150 ug via INTRATHECAL

## 2021-09-03 MED ORDER — TETANUS-DIPHTH-ACELL PERTUSSIS 5-2.5-18.5 LF-MCG/0.5 IM SUSY: 0.5000 mL | PREFILLED_SYRINGE | Freq: Once | INTRAMUSCULAR | Status: DC

## 2021-09-03 MED ORDER — KETOROLAC TROMETHAMINE 30 MG/ML IJ SOLN
30.0000 mg | Freq: Once | INTRAMUSCULAR | Status: AC | PRN
  Administered 2021-09-03: 30 mg via INTRAVENOUS

## 2021-09-03 MED ORDER — DEXAMETHASONE SODIUM PHOSPHATE 10 MG/ML IJ SOLN
INTRAMUSCULAR | Status: DC | PRN
  Administered 2021-09-03: 4 mg via INTRAVENOUS

## 2021-09-03 MED ORDER — IBUPROFEN 600 MG PO TABS
600.0000 mg | ORAL_TABLET | Freq: Four times a day (QID) | ORAL | Status: DC
Start: 2021-09-04 — End: 2021-09-05
  Administered 2021-09-04 – 2021-09-05 (×5): 600 mg via ORAL
  Filled 2021-09-03 (×5): qty 1

## 2021-09-03 MED ORDER — ONDANSETRON HCL 4 MG/2ML IJ SOLN
INTRAMUSCULAR | Status: DC | PRN
  Administered 2021-09-03: 4 mg via INTRAVENOUS

## 2021-09-03 MED ORDER — KETOROLAC TROMETHAMINE 30 MG/ML IJ SOLN: 30.0000 mg | Freq: Four times a day (QID) | INTRAMUSCULAR | Status: DC

## 2021-09-03 MED ORDER — OXYTOCIN-SODIUM CHLORIDE 30-0.9 UT/500ML-% IV SOLN
INTRAVENOUS | Status: DC | PRN
  Administered 2021-09-03: 300 mL via INTRAVENOUS

## 2021-09-03 MED ORDER — OXYTOCIN-SODIUM CHLORIDE 30-0.9 UT/500ML-% IV SOLN
2.5000 [IU]/h | INTRAVENOUS | Status: AC
  Administered 2021-09-03: 2.5 [IU]/h via INTRAVENOUS

## 2021-09-03 MED ORDER — ACETAMINOPHEN 500 MG PO TABS
ORAL_TABLET | ORAL | Status: AC
  Filled 2021-09-03: qty 2

## 2021-09-03 MED ORDER — SENNOSIDES-DOCUSATE SODIUM 8.6-50 MG PO TABS
2.0000 | ORAL_TABLET | Freq: Every day | ORAL | Status: DC
  Administered 2021-09-04 – 2021-09-05 (×2): 2 via ORAL
  Filled 2021-09-03 (×2): qty 2

## 2021-09-03 MED ORDER — ENOXAPARIN SODIUM 60 MG/0.6ML IJ SOSY
50.0000 mg | PREFILLED_SYRINGE | INTRAMUSCULAR | Status: DC
  Administered 2021-09-04 – 2021-09-05 (×2): 50 mg via SUBCUTANEOUS
  Filled 2021-09-03 (×2): qty 0.6

## 2021-09-03 MED ORDER — CEFAZOLIN SODIUM-DEXTROSE 2-4 GM/100ML-% IV SOLN
2.0000 g | INTRAVENOUS | Status: AC
  Administered 2021-09-03: 2 g via INTRAVENOUS

## 2021-09-03 MED ORDER — CEFAZOLIN SODIUM-DEXTROSE 2-4 GM/100ML-% IV SOLN
INTRAVENOUS | Status: AC
  Filled 2021-09-03: qty 100

## 2021-09-03 MED ORDER — NALOXONE HCL 0.4 MG/ML IJ SOLN: 0.4000 mg | INTRAMUSCULAR | Status: DC | PRN

## 2021-09-03 MED ORDER — PHENYLEPHRINE HCL-NACL 20-0.9 MG/250ML-% IV SOLN
INTRAVENOUS | Status: AC
  Filled 2021-09-03: qty 250

## 2021-09-03 MED ORDER — MEPERIDINE HCL 25 MG/ML IJ SOLN: 6.2500 mg | INTRAMUSCULAR | Status: DC | PRN

## 2021-09-03 MED ORDER — BUPIVACAINE IN DEXTROSE 0.75-8.25 % IT SOLN
INTRATHECAL | Status: DC | PRN
  Administered 2021-09-03: 1.8 mL via INTRATHECAL

## 2021-09-03 MED ORDER — SOD CITRATE-CITRIC ACID 500-334 MG/5ML PO SOLN
30.0000 mL | ORAL | Status: AC
Start: 2021-09-03 — End: 2021-09-03
  Administered 2021-09-03: 30 mL via ORAL

## 2021-09-03 MED ORDER — SCOPOLAMINE 1 MG/3DAYS TD PT72
MEDICATED_PATCH | TRANSDERMAL | Status: AC
  Filled 2021-09-03: qty 1

## 2021-09-03 MED ORDER — ONDANSETRON HCL 4 MG/2ML IJ SOLN: 4.0000 mg | Freq: Three times a day (TID) | INTRAMUSCULAR | Status: DC | PRN

## 2021-09-03 MED ORDER — NALOXONE HCL 4 MG/10ML IJ SOLN: 1.0000 ug/kg/h | INTRAVENOUS | Status: DC | PRN

## 2021-09-03 MED ORDER — ACETAMINOPHEN 500 MG PO TABS
1000.0000 mg | ORAL_TABLET | ORAL | Status: AC
  Administered 2021-09-03: 1000 mg via ORAL

## 2021-09-03 MED ORDER — PRENATAL MULTIVITAMIN CH
1.0000 | ORAL_TABLET | Freq: Every day | ORAL | Status: DC
  Administered 2021-09-04 – 2021-09-05 (×2): 1 via ORAL
  Filled 2021-09-03 (×2): qty 1

## 2021-09-03 MED ORDER — BUPIVACAINE HCL (PF) 0.5 % IJ SOLN
INTRAMUSCULAR | Status: AC
  Filled 2021-09-03: qty 30

## 2021-09-03 MED ORDER — FENTANYL CITRATE (PF) 100 MCG/2ML IJ SOLN
INTRAMUSCULAR | Status: AC
  Filled 2021-09-03: qty 2

## 2021-09-03 MED ORDER — OXYTOCIN-SODIUM CHLORIDE 30-0.9 UT/500ML-% IV SOLN
INTRAVENOUS | Status: AC
  Filled 2021-09-03: qty 500

## 2021-09-03 MED ORDER — KETOROLAC TROMETHAMINE 30 MG/ML IJ SOLN
30.0000 mg | Freq: Four times a day (QID) | INTRAMUSCULAR | Status: AC
  Administered 2021-09-03 – 2021-09-04 (×2): 30 mg via INTRAVENOUS
  Filled 2021-09-03 (×2): qty 1

## 2021-09-03 MED ORDER — OXYCODONE HCL 5 MG PO TABS
5.0000 mg | ORAL_TABLET | Freq: Four times a day (QID) | ORAL | Status: DC | PRN
  Administered 2021-09-05 (×2): 5 mg via ORAL
  Filled 2021-09-03 (×2): qty 1

## 2021-09-03 MED ORDER — ENOXAPARIN SODIUM 40 MG/0.4ML IJ SOSY: 40.0000 mg | PREFILLED_SYRINGE | INTRAMUSCULAR | Status: DC

## 2021-09-03 MED ORDER — KETOROLAC TROMETHAMINE 30 MG/ML IJ SOLN
INTRAMUSCULAR | Status: AC
  Filled 2021-09-03: qty 1

## 2021-09-03 MED ORDER — DEXAMETHASONE SODIUM PHOSPHATE 4 MG/ML IJ SOLN
INTRAMUSCULAR | Status: AC
  Filled 2021-09-03: qty 1

## 2021-09-03 MED ORDER — STERILE WATER FOR IRRIGATION IR SOLN
Status: DC | PRN
  Administered 2021-09-03: 1000 mL

## 2021-09-03 MED ORDER — SIMETHICONE 80 MG PO CHEW: 80.0000 mg | CHEWABLE_TABLET | ORAL | Status: DC | PRN

## 2021-09-03 MED ORDER — DIPHENHYDRAMINE HCL 25 MG PO CAPS: 25.0000 mg | ORAL_CAPSULE | ORAL | Status: DC | PRN

## 2021-09-03 MED ORDER — LEVOTHYROXINE SODIUM 75 MCG PO TABS
75.0000 ug | ORAL_TABLET | Freq: Every day | ORAL | Status: DC
  Administered 2021-09-04 – 2021-09-05 (×2): 75 ug via ORAL
  Filled 2021-09-03 (×2): qty 1

## 2021-09-03 MED ORDER — ACETAMINOPHEN 500 MG PO TABS
1000.0000 mg | ORAL_TABLET | Freq: Four times a day (QID) | ORAL | Status: DC
  Administered 2021-09-03 – 2021-09-05 (×7): 1000 mg via ORAL
  Administered 2021-09-05: 650 mg via ORAL
  Filled 2021-09-03 (×7): qty 2

## 2021-09-03 MED ORDER — ACETAMINOPHEN 10 MG/ML IV SOLN
INTRAVENOUS | Status: AC
  Filled 2021-09-03: qty 100

## 2021-09-03 MED ORDER — MORPHINE SULFATE (PF) 0.5 MG/ML IJ SOLN
INTRAMUSCULAR | Status: AC
  Filled 2021-09-03: qty 10

## 2021-09-03 MED ORDER — POVIDONE-IODINE 10 % EX SWAB
2.0000 "application " | Freq: Once | CUTANEOUS | Status: AC
  Administered 2021-09-03: 2 via TOPICAL

## 2021-09-03 MED ORDER — ONDANSETRON HCL 4 MG/2ML IJ SOLN
INTRAMUSCULAR | Status: AC
  Filled 2021-09-03: qty 2

## 2021-09-03 MED ORDER — SOD CITRATE-CITRIC ACID 500-334 MG/5ML PO SOLN
ORAL | Status: AC
  Filled 2021-09-03: qty 30

## 2021-09-03 MED ORDER — DIBUCAINE (PERIANAL) 1 % EX OINT: 1.0000 "application " | TOPICAL_OINTMENT | CUTANEOUS | Status: DC | PRN

## 2021-09-03 MED ORDER — ZOLPIDEM TARTRATE 5 MG PO TABS: 5.0000 mg | ORAL_TABLET | Freq: Every evening | ORAL | Status: DC | PRN

## 2021-09-03 MED ORDER — COCONUT OIL OIL: 1.0000 "application " | TOPICAL_OIL | Status: DC | PRN

## 2021-09-03 MED ORDER — LACTATED RINGERS IV SOLN: INTRAVENOUS | Status: DC

## 2021-09-03 MED ORDER — SCOPOLAMINE 1 MG/3DAYS TD PT72
1.0000 | MEDICATED_PATCH | Freq: Once | TRANSDERMAL | Status: DC
  Administered 2021-09-03: 1.5 mg via TRANSDERMAL

## 2021-09-03 MED ORDER — FENTANYL CITRATE (PF) 100 MCG/2ML IJ SOLN
INTRAMUSCULAR | Status: DC | PRN
Start: 2021-09-03 — End: 2021-09-03
  Administered 2021-09-03: 15 ug via INTRATHECAL

## 2021-09-03 SURGICAL SUPPLY — 32 items
BENZOIN TINCTURE PRP APPL 2/3 (GAUZE/BANDAGES/DRESSINGS) IMPLANT
CHLORAPREP W/TINT 26ML (MISCELLANEOUS) ×4 IMPLANT
CLAMP CORD UMBIL (MISCELLANEOUS) ×2 IMPLANT
CLOTH BEACON ORANGE TIMEOUT ST (SAFETY) ×2 IMPLANT
DRSG OPSITE POSTOP 4X10 (GAUZE/BANDAGES/DRESSINGS) ×2 IMPLANT
ELECT REM PT RETURN 9FT ADLT (ELECTROSURGICAL) ×2
ELECTRODE REM PT RTRN 9FT ADLT (ELECTROSURGICAL) ×1 IMPLANT
EXTRACTOR VACUUM BELL STYLE (SUCTIONS) IMPLANT
GLOVE BIOGEL PI IND STRL 6.5 (GLOVE) ×2 IMPLANT
GLOVE BIOGEL PI INDICATOR 6.5 (GLOVE) ×2
GLOVE ECLIPSE 6.5 STRL STRAW (GLOVE) ×4 IMPLANT
GOWN STRL REUS W/TWL LRG LVL3 (GOWN DISPOSABLE) ×6 IMPLANT
KIT ABG SYR 3ML LUER SLIP (SYRINGE) IMPLANT
NDL HYPO 25X1 1.5 SAFETY (NEEDLE) IMPLANT
NEEDLE HYPO 22GX1.5 SAFETY (NEEDLE) ×2 IMPLANT
NEEDLE HYPO 25X1 1.5 SAFETY (NEEDLE) IMPLANT
NS IRRIG 1000ML POUR BTL (IV SOLUTION) ×2 IMPLANT
PACK C SECTION WH (CUSTOM PROCEDURE TRAY) ×2 IMPLANT
PAD OB MATERNITY 4.3X12.25 (PERSONAL CARE ITEMS) ×2 IMPLANT
STRIP CLOSURE SKIN 1/2X4 (GAUZE/BANDAGES/DRESSINGS) IMPLANT
SUT MON AB 4-0 PS1 27 (SUTURE) ×2 IMPLANT
SUT PLAIN 2 0 (SUTURE) ×1
SUT PLAIN ABS 2-0 CT1 27XMFL (SUTURE) ×1 IMPLANT
SUT VIC AB 0 CT1 36 (SUTURE) ×4 IMPLANT
SUT VIC AB 0 CTX 36 (SUTURE) ×1
SUT VIC AB 0 CTX36XBRD ANBCTRL (SUTURE) ×1 IMPLANT
SUT VIC AB 2-0 CT1 27 (SUTURE) ×2
SUT VIC AB 2-0 CT1 TAPERPNT 27 (SUTURE) IMPLANT
SYR CONTROL 10ML LL (SYRINGE) ×2 IMPLANT
TOWEL OR 17X24 6PK STRL BLUE (TOWEL DISPOSABLE) ×2 IMPLANT
TRAY FOLEY W/BAG SLVR 14FR LF (SET/KITS/TRAYS/PACK) ×2 IMPLANT
WATER STERILE IRR 1000ML POUR (IV SOLUTION) ×2 IMPLANT

## 2021-09-03 NOTE — Anesthesia Procedure Notes (Signed)
Spinal  Patient location during procedure: OR Start time: 09/03/2021 2:20 PM End time: 09/03/2021 2:22 PM Staffing Performed: anesthesiologist  Anesthesiologist: Darral Dash, DO Performed by: Darral Dash, DO Authorized by: Darral Dash, DO   Preanesthetic Checklist Completed: patient identified, IV checked, site marked, risks and benefits discussed, surgical consent, monitors and equipment checked, pre-op evaluation and timeout performed Spinal Block Patient position: sitting Prep: DuraPrep Patient monitoring: heart rate, cardiac monitor, continuous pulse ox and blood pressure Approach: midline Location: L4-5 Injection technique: single-shot Needle Needle type: Pencan  Needle gauge: 24 G Needle length: 10 cm Assessment Events: CSF return Additional Notes Patient identified. Risks/Benefits/Options discussed with patient including but not limited to bleeding, infection, nerve damage, paralysis, failed block, incomplete pain control, headache, blood pressure changes, nausea, vomiting, reactions to medications, itching and postpartum back pain. Confirmed with bedside nurse the patient's most recent platelet count. Confirmed with patient that they are not currently taking any anticoagulation, have any bleeding history or any family history of bleeding disorders. Patient expressed understanding and wished to proceed. All questions were answered. Sterile technique was used throughout the entire procedure. Please see nursing notes for vital signs. Warning signs of high block given to the patient including shortness of breath, tingling/numbness in hands, complete motor block, or any concerning symptoms with instructions to call for help. Patient was given instructions on fall risk and not to get out of bed. All questions and concerns addressed with instructions to call with any issues or inadequate analgesia.

## 2021-09-03 NOTE — Op Note (Addendum)
Operative Note   Patient: Emma Robbins  Date of Procedure: 09/03/2021  Procedure: Primary Low Transverse Cesarean   Indications:  malpresentation - breech  Pre-operative Diagnosis: Non-cephalic fetal presentation, declines external cephalic version, undesired fertility  Post-operative Diagnosis: Same and Bilateral Tubal Sterilization via Bilateral salpingectomy  TOLAC Candidate: Yes   Surgeon: Surgeon(s) and Role:    * Sloan Leiter, MD - Primary    * Renard Matter, MD  Assistants: Renard Matter, MD  An experienced assistant was required given the standard of surgical care given the complexity of the case.  This assistant was needed for exposure, dissection, suctioning, retraction, instrument exchange, assisting with delivery with administration of fundal pressure, and for overall help during the procedure.   Anesthesia: spinal  Anesthesiologist: No responsible provider has been recorded for the case.   Antibiotics: Cefazolin   Estimated Blood Loss: 247 ml   Total IV Fluids: 2400 ml  Urine Output:  100 cc OF clear urine  Specimens: bilateral fallopian tubes   Complications: no complications   Indications: Emma Robbins is a 31 y.o. [id number] with an IUP 71w1dpresenting for scheduled cesarean secondary to the indications listed above. Clinical course notable for unstable lie with breech presentation at admission. Patient was offered an ECV but declined.    Findings: Viable infant in frank breech  presentation, no nuchal cord present. Apgars 9 , 9 , . Weight 3380 g . Clear amniotic fluid. Normal placenta, three vessel cord. Normal uterus, Normal bilateral fallopian tubes, Normal bilateral ovaries.  Procedure Details: A Time Out was held and the above information confirmed. The patient received intravenous antibiotics and had sequential compression devices applied to her lower extremities preoperatively. The patient was taken back to the operative suite where spinal anesthesia  was administered. After induction of anesthesia, the patient was draped and prepped in the usual sterile manner and placed in a dorsal supine position with a leftward tilt. A low transverse skin incision was made with scalpel and carried down through the subcutaneous tissue to the fascia. Fascial incision was made and extended transversely. The fascia was separated from the underlying rectus tissue superiorly and inferiorly. The rectus muscles were separated in the midline bluntly and the peritoneum was entered bluntly. An Alexis retractor was placed to aid in visualization of the uterus. A low transverse uterine incision was made. The infant was successfully delivered from frank breech  presentation, the hips were lifted out of the hysterotomy and the legs were delivered easily. The arms were swept out gently, first left then right and the head delivered easily. The umbilical cord was clamped after 1 minute. Cord blood was obtained for evaluation. The placenta was removed Intact and appeared normal. The uterine incision was closed with running locked sutures of 0-Vicryl and then a second imbricating layer was also placed with 0-Vicryl Overall, excellent hemostasis was noted.   Attention was then turned to the fallopian tubes. A Tristram Milian clamp was placed across the right fallopian tube taking care to incorporate the fimbriae. A second clamp was then placed below the first. The fallopian tube was then removed with Metzenbaum scissors. The pedicle was then suture ligated with 2-0 Vicryl x2 and the clamps were removed with hemostasis noted. The same procedure was then carried out on the left fallopian tube with hemostasis noted.  The abdomen and the pelvis were cleared of all clot and debris and the AUbaldo Glassingwas removed. Hemostasis was confirmed on all surfaces. The fascia was then closed using 0 Vicryl  in a running fashion. The subcutaneous layer was reapproximated with plain gut and the skin was closed with a 4-0  Monocryl subcuticular stitch. The patient tolerated the procedure well. Sponge, lap, instrument and needle counts were correct x 2. She was taken to the recovery room in stable condition.  Disposition: PACU - hemodynamically stable.    Signed: Renard Matter, MD, MPH [facility])   Attestation of Attending Supervision of OB Fellow: Evaluation, management, and procedures were performed by the [facility] Fellow under my supervision and collaboration. I was scrubbed and present for all portions of this procedure. I agree with the documentation and plan.  Feliz Beam, MD, [place] [facility] [employer] (Faculty Practice)  09/03/2021 6:26 PM

## 2021-09-03 NOTE — Transfer of Care (Signed)
Immediate Anesthesia Transfer of Care Note  Patient: Emma Robbins  Procedure(s) Performed: CESAREAN SECTION  Patient Location: PACU  Anesthesia Type:Spinal  Level of Consciousness: awake  Airway & Oxygen Therapy: Patient Spontanous Breathing  Post-op Assessment: Report given to RN  Post vital signs: Reviewed and stable  Last Vitals:  Vitals Value Taken Time  BP    Temp    Pulse 90 09/03/21 1552  Resp 13 09/03/21 1552  SpO2 98 % 09/03/21 1552  Vitals shown include unvalidated device data.  Last Pain:  Vitals:   09/03/21 1119  TempSrc: Oral      Patients Stated Pain Goal: 0 (53/61/44 3154)  Complications: No notable events documented.

## 2021-09-03 NOTE — Anesthesia Postprocedure Evaluation (Signed)
Anesthesia Post Note  Patient: Annastasia Haskins  Procedure(s) Performed: CESAREAN SECTION     Patient location during evaluation: PACU Anesthesia Type: Spinal Level of consciousness: awake and alert Pain management: pain level controlled Vital Signs Assessment: post-procedure vital signs reviewed and stable Respiratory status: spontaneous breathing, nonlabored ventilation, respiratory function stable and patient connected to nasal cannula oxygen Cardiovascular status: stable and blood pressure returned to baseline Postop Assessment: no apparent nausea or vomiting Anesthetic complications: no   No notable events documented.  Last Vitals:  Vitals:   09/03/21 1700 09/03/21 1710  BP: 96/72 114/75  Pulse: 77 78  Resp: 19 16  Temp:  36.4 C  SpO2: 100% 97%    Last Pain:  Vitals:   09/03/21 1710  TempSrc: Axillary  PainSc: 0-No pain   Pain Goal: Patients Stated Pain Goal: 0 (09/03/21 1119)  LLE Motor Response: Purposeful movement (09/03/21 1700) LLE Sensation: Tingling (09/03/21 1700) RLE Motor Response: Purposeful movement (09/03/21 1700) RLE Sensation: Tingling (09/03/21 1700) L Sensory Level: T10-Umbilical region (66/44/03 1700) R Sensory Level: T10-Umbilical region (47/42/59 1700) Epidural/Spinal Function Cutaneous sensation: Able to Wiggle Toes (09/03/21 1710), Patient able to flex knees: Yes (09/03/21 1710), Patient able to lift hips off bed: No (09/03/21 1710), Back pain beyond tenderness at insertion site: No (09/03/21 1710), Progressively worsening motor and/or sensory loss: No (09/03/21 1710), Bowel and/or bladder incontinence post epidural: No (09/03/21 1710)  Belenda Cruise P Wetona Viramontes

## 2021-09-03 NOTE — Discharge Summary (Signed)
Postpartum Discharge Summary  Date of Service updated***     Patient Name: Emma Robbins DOB: 1990/06/20 MRN: [id number]  Date of admission: 09/03/2021 Delivery date:09/03/2021  Delivering provider: Renard Matter  Date of discharge: 09/03/2021  Admitting diagnosis: Malpresentation before onset of labor, fetus 1 [O32.9XX1] Intrauterine pregnancy: [redacted]w[redacted]d    Secondary diagnosis:  Principal Problem:   Malpresentation before onset of labor, fetus 1 Active Problems:   Acquired hypothyroidism   Encounter for supervision of normal pregnancy   Unwanted fertility   Cesarean delivery delivered  Additional problems: ***None    Discharge diagnosis: Term Pregnancy Delivered and hypothyroidism                                              Post partum procedures:*** Augmentation: N/A Complications: None  Hospital course: Sceduled C/S   31y.o. yo [id number] admitted to the hospital 09/03/2021 for scheduled cesarean section with the following indication:Malpresentation.Delivery details are as follows:  Membrane Rupture Time/Date: 2:57 PM ,09/03/2021   Delivery Method:C-Section, Low Transverse  Details of operation can be found in separate operative note.  Patient had an uncomplicated postpartum course.  She is ambulating, tolerating a regular diet, passing flatus, and urinating well. Patient is discharged home in stable condition on  09/03/21        Newborn Data: Birth date:09/03/2021  Birth time:2:57 PM  Gender:Female  Living status:Living  Apgars:9 ,9  Weight:3380 g     Magnesium Sulfate received: No BMZ received: No Rhophylac:N/A MMR:N/A T-DaP: declined Flu: No Transfusion:{Transfusion received:30440034}  Physical exam  Vitals:   09/03/21 1119 09/03/21 1553 09/03/21 1600 09/03/21 1615  BP: 109/74 101/65 100/65 105/67  Pulse: 80 90 76 83  Resp: 18 13 16 17   Temp: 98.9 F (37.2 C) (!) 97.5 F (36.4 C)    TempSrc: Oral Oral    SpO2: 100% 98% 98% 99%  Weight: 102.1 kg      Height: 5' 4"  (1.626 m)      General: {Exam; general:21111117} Lochia: {Desc; appropriate/inappropriate:30686::"appropriate"} Uterine Fundus: {Desc; firm/soft:30687} Incision: {Exam; incision:21111123} DVT Evaluation: {Exam; dvt:2111122} Labs: Lab Results  Component Value Date   WBC 8.6 09/01/2021   HGB 11.8 (L) 09/01/2021   HCT 34.3 (L) 09/01/2021   MCV 94.5 09/01/2021   PLT 240 09/01/2021      Latest Ref Rng & Units 06/23/2020   12:05 PM  CMP  Glucose 65 - 99 mg/dL 56   BUN 6 - 20 mg/dL 12   Creatinine 0.57 - 1.00 mg/dL 0.83   Sodium 134 - 144 mmol/L 139   Potassium 3.5 - 5.2 mmol/L 4.5   Chloride 96 - 106 mmol/L 101   CO2 20 - 29 mmol/L 25   Calcium 8.7 - 10.2 mg/dL 9.7   Total Protein 6.0 - 8.5 g/dL 6.7   Total Bilirubin 0.0 - 1.2 mg/dL 0.3   Alkaline Phos 44 - 121 IU/L 104   AST 0 - 40 IU/L 28   ALT 0 - 32 IU/L 29    Edinburgh Score:    31/02/2021    4:01 PM  Edinburgh Postnatal Depression Scale Screening Tool  I have been able to laugh and see the funny side of things. 1  I have looked forward with enjoyment to things. 1  I have blamed myself unnecessarily when things went wrong. 2  I have been anxious or worried for no good reason. 1  I have felt scared or panicky for no good reason. 1  Things have been getting on top of me. 2  I have been so unhappy that I have had difficulty sleeping. 2  I have felt sad or miserable. 2  I have been so unhappy that I have been crying. 3  The thought of harming myself has occurred to me. 0  Edinburgh Postnatal Depression Scale Total 15     After visit meds:  Allergies as of 09/03/2021   No Known Allergies   Med Rec must be completed prior to using this [facility]***        Discharge home in stable condition Infant Feeding: {Baby feeding:23562} Infant Disposition:{CHL IP OB HOME WITH OIBBCW:88891} Discharge instruction: per After Visit Summary and Postpartum booklet. Activity: Advance as tolerated. Pelvic rest  for 6 weeks.  Diet: routine diet Future Appointments:No future appointments. Follow up Visit: Message sent to [facility] by Dr. Cy Blamer on 6/8  Please schedule this patient for a In person postpartum visit in 4 weeks with the following provider: Any provider. Additional Postpartum F/U:Incision check 1 week and 6 week TSH check   High risk pregnancy complicated by:  hypothyroidism Delivery mode:  C-Section, Low Transverse  Anticipated Birth Control:   Bilateral salpingectomy done   09/03/2021 Renard Matter, MD

## 2021-09-03 NOTE — Lactation Note (Signed)
This note was copied from a baby's chart. Lactation Consultation Note  Patient Name: Emma Robbins [id number] Date: 09/03/2021 Reason for consult: Initial assessment;Term (C/S delivery) Age:31 hours, P2, term female infant, Mom was C/S delivery, see mom's MR. Mom's current feeding choice is breastfeeding and supplementing with donor breast milk. Per mom,she made two attempt to latch infant at the breast, infant has been sleepy and not latching at the breast, mom tried latching infant STS.  Per mom, infant was given  7 mls of donor breast milk at 2030 pm due not latching at the breast., LC did not observe latch, infant was sleeping on mom's chest. Mom will continue to make attempt latching infant at the breast based on feeding cues, 8 to 12+ times within 24 hours, skin to skin. Mom knows to call [place] for assistance with latching infant at the breast. Mom was given Breastfeeding supplemental sheet mom knows on Day 1:  if infant latches at breast ( her choice) she can offer 5-7 mls per feeding, if infant doesn't latch she can offer infant (5-15 mls ) per feeding.  Mom made aware of O/P services, breastfeeding support groups, community resources, and our phone # for post-discharge questions.   Maternal Data Has patient been taught Hand Expression?: Yes Does the patient have breastfeeding experience prior to this delivery?: Yes How long did the patient breastfeed?: Per mom , she breastfeed her 27 month daughter for 6 months.  Feeding Mother's Current Feeding Choice: Breast Milk and Donor Milk  LATCH Score                    Lactation Tools Discussed/Used Tools: Pump Breast pump type: Manual Pump Education: Setup, frequency, and cleaning;Milk Storage Reason for Pumping: mom requested hand pump due to having inverted nipples to pre-pump breast prior to latching infant. Pumping frequency: pre-pump before latching infant at the breast.  Interventions Interventions: Breast feeding  basics reviewed;Skin to skin;Expressed milk;Hand express;Education;DEBP;LC Services brochure  Discharge Pump: Personal (Per mom, she brought her medela DEBP with her from home.)  Consult Status Consult Status: Follow-up Date: 09/04/21 Follow-up type: In-patient    [name] 09/03/2021, 9:36 PM

## 2021-09-03 NOTE — H&P (Addendum)
OBSTETRIC ADMISSION HISTORY AND PHYSICAL  Emma Robbins is a 31 y.o. female G53P1011 with IUP at 29w1dby early [place]ore[place]resenting for scheduled cesarean delivery in setting of malpresentation (breech) and BTL. She reports +FMs, No LOF, no VB, no blurry vision, headaches or peripheral edema, and RUQ pain.  She plans on breast feeding. She request BTL for birth control. [place]  Estimated Date of Delivery: 09/09/21  Sono:   '@[redacted]w[redacted]d'$ , CWD, normal anatomy, breech presentation, posterior placental lie, 3213g, 31% EFW   Prenatal History/Complications:  Hypothyroidism  Unwanted fertility  Past Medical History: Past Medical History:  Diagnosis Date   Anxiety    Depression    Gallstone 11/2011   Headache(784.0)    unspecified - 2-3 x/week   Hemorrhoids    Hypothyroidism    Thyroid disease     Past Surgical History: Past Surgical History:  Procedure Laterality Date   BREAST SURGERY  2012   Fibroadenoma   CHOLECYSTECTOMY  12/09/2011   Procedure: LAPAROSCOPIC CHOLECYSTECTOMY;  Surgeon: CHaywood Lasso MD;  [facility]  Service: General;  Laterality: N/A;   LAPAROSCOPIC OVARIAN CYSTECTOMY  12/16/10   Right cystadenofibroma    Obstetrical History: OB History     Gravida  3   Para  1   Term  1   Preterm      AB  1   Living  1      SAB  1   IAB      Ectopic      Multiple  0   Live Births  1           Social History Social History   Socioeconomic History   Marital status: Married    Spouse name: Not on file   Number of children: Not on file   Years of education: Not on file   Highest education level: Not on file  Occupational History   Not on file  Tobacco Use   Smoking status: Never   Smokeless tobacco: Never  Vaping Use   Vaping Use: Never used  Substance and Sexual Activity   Alcohol use: Not Currently    Alcohol/week: 3.0 standard drinks of alcohol    Types: 3  Standard drinks or equivalent per week    Comment: Rare   Drug use: Not Currently    Types: Cocaine    Comment: per pt did Cocaine 2 yrs ago.05-2017   Sexual activity: Yes    Birth control/protection: None    Comment: Currently pregnant  Other Topics Concern   Not on file  Social History Narrative   Not on file   Social Determinants of Health   Financial Resource Strain: Not on file  Food Insecurity: Not on file  Transportation Needs: Not on file  Physical Activity: Not on file  Stress: Not on file  Social Connections: Not on file    Family History: Family History  Problem Relation Age of Onset   Hyperlipidemia Father    Thyroid disease Father    Diabetes Father    Thyroid disease Brother    Thyroid disease Sister    Hypertension Maternal Grandmother    Diabetes Paternal Grandmother    Hyperlipidemia Paternal Grandmother    Cancer Paternal Grandfather        Prostate    Allergies: No Known Allergies  Medications Prior to Admission  Medication Sig Dispense Refill Last Dose  acetaminophen (TYLENOL) 500 MG tablet Take 500-1,000 mg by mouth daily as needed for moderate pain.      levothyroxine (SYNTHROID) 75 MCG tablet Take 1 tablet (75 mcg total) by mouth daily. 30 tablet 1    Prenatal Vit-Fe Phos-FA-Omega (VITAFOL GUMMIES) 3.33-0.333-34.8 MG CHEW Chew 3 tablets by mouth daily. (Patient taking differently: Chew 2 tablets by mouth daily.) 90 tablet 6 09/02/2021   Elastic Bandages & Supports (COMFORT FIT MATERNITY SUPP LG) MISC 1 Units by Does not apply route daily. (Patient not taking: Reported on 09/02/2021) 1 each 0    terconazole (TERAZOL 3) 0.8 % vaginal cream Place 1 applicator vaginally at bedtime. Apply nightly for three nights. (Patient not taking: Reported on 09/02/2021) 20 g 0      Review of Systems   All systems reviewed and negative except as stated in HPI  Blood pressure 109/74, pulse 80, temperature 98.9 F (37.2 C), temperature source Oral, resp. rate 18,  height '5\' 4"'$  (1.626 m), weight 102.1 kg, last menstrual period 11/20/2020, SpO2 100 %, currently breastfeeding. General appearance: alert, cooperative, and appears stated age Lungs: clear to auscultation bilaterally Heart: regular rate and rhythm Abdomen: soft, non-tender; bowel sounds normal Extremities: Homans sign is negative, no sign of DVT Presentation: breech Fetal monitoring HR **   Prenatal labs: ABO, Rh: --/--/O POS (06/06 1003) Antibody: NEG (06/06 1003) Rubella: 1.29 (11/22 1540) RPR: NON REACTIVE (06/06 1003)  HBsAg: Negative (11/22 1540)  HIV: Non Reactive (03/15 0851)  GBS: Negative/-- (05/23 0920)  2 hr Glucola normal Genetic screening  LR female NIPS, negative carrier screen, negative AFP Anatomy US normal  Prenatal Transfer Tool  Maternal Diabetes: No Genetic Screening: Normal Maternal Ultrasounds/Referrals: Normal Fetal Ultrasounds or other Referrals:  None Maternal Substance Abuse:  No Significant Maternal Medications:  Meds include: Other: Synthroid Significant Maternal Lab Results: Group B Strep negative  No results found for this or any previous visit (from the past 24 hour(s)).  Patient Active Problem List   Diagnosis Date Noted   Unwanted fertility 08/03/2021   Malpresentation of fetus 08/03/2021   Nausea and vomiting in pregnancy 03/18/2021   Encounter for supervision of normal pregnancy 02/04/2021   Adjustment disorder with mixed anxiety and depressed mood 08/10/2017   Acquired hypothyroidism 07/14/2009    Assessment/Plan:  Bailei Robbins is a 31 y.o. G3P1011 at 66w1dhere forscheduled cesarean delivery for malpresentation and BTL  #Scheduled CS #BTL desired Patient with malpresentation/unstable lie. On 6/6 in office transverse lie. On 6/8 during MFM [place]ree[place].  The risks of cesarean section were discussed with the patient including but were not limited to: bleeding which may require transfusion or reoperation; infection which may require  antibiotics; injury to bowel, bladder, ureters or other surrounding organs; injury to the fetus; need for additional procedures including hysterectomy in the event of a life-threatening hemorrhage; placental abnormalities wth subsequent pregnancies, incisional problems, thromboembolic phenomenon and other postoperative/anesthesia complications.  Patient also desires permanent sterilization.  Other reversible forms of contraception were discussed with patient; she declines all other modalities. Risks of procedure discussed with patient including but not limited to: risk of regret, permanence of method, bleeding, infection, injury to surrounding organs and need for additional procedures.  Failure risk of about 1% with increased risk of ectopic gestation if pregnancy occurs was also discussed with patient.  Also discussed possibility of post-tubal pain syndrome. The patient concurred with the proposed plan, giving informed written consent for the procedures.  Patient has been NPO since midnight  she will remain NPO for procedure. Anesthesia and OR aware.  Preoperative prophylactic antibiotics and SCDs ordered on call to the OR.  To OR when ready.  #Pain: spinal #ID:  GBS neg, will get surgical ppx #MOF: breast #MOC:BTL #Circ:  N/A  #Hypothyroidism On Synthroid 40mg. Last TSH on 4/24 4.27. Will recheck TSH at admission and again at 6 weeks PP.  KSloan Leiter MD  09/03/2021, 2:10 PM  Attestation of Attending Supervision of OB Fellow: Evaluation, management, and procedures were performed by the [facility] under my supervision and collaboration.  I have reviewed the OB Fellow's note and chart, and I agree with the management and plan.  I performed ultrasound in pre-op area, fetus remains in breech position with head at maternal right fundus.  Plan for primary c-section + bilateral tubal ligation. Patient declines attempt at external cephalic version.  The risks of cesarean section were discussed with the  patient; including but not limited to: infection which may require antibiotics; bleeding which may require transfusion or re-operation; injury to bowel, bladder, ureters or other surrounding organs; injury to the fetus; need for additional procedures in the event of a life-threatening hemorrhage; placental abnormalities wth subsequent pregnancies,  risk of needing c-sections in future pregnancies, incisional problems, thromboembolic phenomenon and other postoperative/anesthesia complications. Reviewed risks of bilateral tubal ligation including infection, hemorrhage, damage to surrounding tissue and organs, risk of regret. Reviewed bilateral tubal ligation failure rate of ~1%, and that there is a slightly higher risk of ectopic after tubal ligation; if she has any reason to believe she is pregnant, she should take a pregnancy test. She understands this is an elective procedure and again affirms her desire. Answered all questions. The patient verbalized understanding of the plan, giving informed consent for the procedure. She is agreeable to blood transfusion in the event of emergency.  Patient has been NPO since midnight, she will remain NPO for procedure Anesthesia and OR aware Preoperative prophylactic antibiotics and SCDs ordered on call to the OR  To OR when ready   K. MArvilla Meres MD, [facility](Faculty Practice)  09/03/2021 2:10 PM

## 2021-09-03 NOTE — Anesthesia Preprocedure Evaluation (Signed)
Anesthesia Evaluation  Patient identified by MRN, date of birth, ID band Patient awake    Reviewed: Allergy & Precautions, NPO status , Patient's Chart, lab work & pertinent test results  Airway Mallampati: II  TM Distance: >3 FB Neck ROM: Full    Dental no notable dental hx.    Pulmonary neg pulmonary ROS,    Pulmonary exam normal        Cardiovascular negative cardio ROS   Rhythm:Regular Rate:Normal     Neuro/Psych  Headaches, Anxiety Depression    GI/Hepatic negative GI ROS, Neg liver ROS,   Endo/Other  Hypothyroidism   Renal/GU negative Renal ROS  negative genitourinary   Musculoskeletal   Abdominal Normal abdominal exam  (+)   Peds  Hematology negative hematology ROS (+)   Anesthesia Other Findings   Reproductive/Obstetrics (+) Pregnancy                             Anesthesia Physical Anesthesia Plan  ASA: 2  Anesthesia Plan: Spinal   Post-op Pain Management:    Induction:   PONV Risk Score and Plan: 2 and Ondansetron, Dexamethasone and Treatment may vary due to age or medical condition  Airway Management Planned: Simple Face Mask, Natural Airway and Nasal Cannula  Additional Equipment: None  Intra-op Plan:   Post-operative Plan:   Informed Consent: I have reviewed the patients History and Physical, chart, labs and discussed the procedure including the risks, benefits and alternatives for the proposed anesthesia with the patient or authorized representative who has indicated his/her understanding and acceptance.     Dental advisory given  Plan Discussed with: CRNA  Anesthesia Plan Comments:         Anesthesia Quick Evaluation

## 2021-09-04 ENCOUNTER — Encounter (HOSPITAL_COMMUNITY): Payer: Self-pay | Admitting: Obstetrics and Gynecology

## 2021-09-04 LAB — CBC
HCT: 31.3 % — ABNORMAL LOW (ref 36.0–46.0)
Hemoglobin: 10.2 g/dL — ABNORMAL LOW (ref 12.0–15.0)
MCH: 31.4 pg (ref 26.0–34.0)
MCHC: 32.6 g/dL (ref 30.0–36.0)
MCV: 96.3 fL (ref 80.0–100.0)
Platelets: 216 10*3/uL (ref 150–400)
RBC: 3.25 MIL/uL — ABNORMAL LOW (ref 3.87–5.11)
RDW: 13.2 % (ref 11.5–15.5)
WBC: 11.1 10*3/uL — ABNORMAL HIGH (ref 4.0–10.5)
nRBC: 0 % (ref 0.0–0.2)

## 2021-09-04 LAB — BIRTH TISSUE RECOVERY COLLECTION (PLACENTA DONATION)

## 2021-09-04 NOTE — Lactation Note (Signed)
This note was copied from a baby's chart. Lactation Consultation Note  Patient Name: Emma Robbins [id number] Date: 09/04/2021   Age:31 hours Per RN Emma Robbins) mom would prefer to be seen by [place] in morning.  Maternal Data    Feeding    LATCH Score                    Lactation Tools Discussed/Used    Interventions    Discharge    Consult Status      Emma Robbins 09/04/2021, 10:38 PM

## 2021-09-04 NOTE — Progress Notes (Signed)
Subjective: Postpartum Day 1: Cesarean Delivery Patient reports incisional pain.    Objective: Vital signs in last 24 hours: Temp:  [97.5 F (36.4 C)-98.9 F (37.2 C)] 98.2 F (36.8 C) (06/09 0356) Pulse Rate:  [74-90] 80 (06/09 0356) Resp:  [13-23] 16 (06/08 2230) BP: (96-114)/(59-75) 98/59 (06/09 0356) SpO2:  [96 %-100 %] 97 % (06/09 0356) Weight:  [102.1 kg] 102.1 kg (06/08 1119)  Physical Exam:  General: alert, cooperative, and no distress Lochia: appropriate Uterine Fundus: firm Incision: no significant drainage DVT Evaluation: No evidence of DVT seen on physical exam.  Recent Labs    09/01/21 1003 09/04/21 0421  HGB 11.8* 10.2*  HCT 34.3* 31.3*    Assessment/Plan: Status post Cesarean section. Doing well postoperatively.  Continue current care.  Hansel Feinstein 09/04/2021, 8:16 AM

## 2021-09-04 NOTE — Progress Notes (Signed)
POSTPARTUM PROGRESS NOTE  Post Op Day 1  Subjective:  Emma Robbins is a 31 y.o. C3J6283 s/p pLTCS with b/l salpingectomy at 63w1dfor breech presentation. No acute events overnight. Patient denies problems with ambulating, voiding or PO intake. Her foley was removed this morning. She denies nausea or vomiting. Pain is well controlled. She has had flatus. She has not had bowel movement. Bleeding is similar to a period. No other concerns at this time.  Objective: Blood pressure (!) 98/59, pulse 80, temperature 98.2 F (36.8 C), temperature source Oral, resp. rate 16, height '5\' 4"'$  (1.626 m), weight 102.1 kg, last menstrual period 11/20/2020, SpO2 97 %, currently breastfeeding.  Physical Exam:  General: alert, cooperative and no distress Resp: normal work of breathing on room air Heart: normal rate, warm and well perfused Abdomen: soft, nontender Uterine Fundus: firm and below umbilicus DVT Evaluation: No calf swelling or tenderness Extremities: No LE edema or calf tenderness to palpation Skin: warm, dry; incision clean/dry/intact without drainage on honeycomb  Recent Labs    09/01/21 1003 09/04/21 0421  HGB 11.8* 10.2*  HCT 34.3* 31.3*    Assessment/Plan: BBranden Vineis a 31y.o. [id number]/p pLTCS and bilateral salpingectomy at 366w1d  POD#1: Doing well. Meeting all milestones. VSS. Continue routine PP care. Ambulation encouraged.  Contraception: Bilateral salpingectomy 09/03/2021  Feeding: Bottle and trying breast but has not been successful, lactation is following  Dispo: Plan for discharge on POD2.   LOS: 1 day   BeLour[facility] Surgery Center LLCStudent-PA 09/04/2021, 7:42 AM

## 2021-09-05 MED ORDER — ACETAMINOPHEN 325 MG PO TABS
ORAL_TABLET | ORAL | Status: AC
  Filled 2021-09-05: qty 2

## 2021-09-05 MED ORDER — IBUPROFEN 600 MG PO TABS: 600.0000 mg | ORAL_TABLET | Freq: Four times a day (QID) | ORAL | 0 refills | Status: DC | PRN

## 2021-09-05 MED ORDER — ACETAMINOPHEN 500 MG PO TABS
1000.0000 mg | ORAL_TABLET | Freq: Four times a day (QID) | ORAL | 0 refills | Status: DC | PRN
Start: 2021-09-05 — End: 2023-03-25

## 2021-09-05 MED ORDER — OXYCODONE HCL 5 MG PO TABS: 5.0000 mg | ORAL_TABLET | Freq: Four times a day (QID) | ORAL | 0 refills | Status: DC | PRN

## 2021-09-05 NOTE — Lactation Note (Signed)
This note was copied from a baby's chart. Lactation Consultation Note  Patient Name: Girl Minda Faas [id number] Date: 09/05/2021 Reason for consult: Mother's request;Follow-up assessment;Term;Maternal endocrine disorder Age:31 hours  Mom wanted to know what she could do to increase her production of milk. Even though Mom is offering the breast before bottle, infant is only latching briefly before getting frustrated. I emphasized the importance of pumping whenever infant receives a bottle. Mom does report that her breasts feel a little bit heavier today.   I observed Mom pump for a few minutes. A size 21 flange is appropriate for her at this time. Mom's personal Medela pump is in her room, but it does not have size 21 flanges; I provided size 21 flanges for her personal pump, as well.   Guidelines for bottle-feeding volume parameters were provided, with the qualifier of feeding infant until content.   Parents had no other questions for me.   Feeding Mother's Current Feeding Choice: Breast Milk and Donor Milk    Lactation Tools Discussed/Used Tools: Pump;Flanges Flange Size: 21 Breast pump type: Manual  Interventions Interventions: Education  Discharge Pump: Personal (Medela DEBP)  Matthias Hughs [facility] 09/05/2021, 12:42 PM

## 2021-09-05 NOTE — Progress Notes (Signed)
CSW received consult for hx of Depression and Postpartum depression.  CSW met with MOB to offer support and complete assessment, FOB present. MOB granted CSW verbal permission to speak in front of FOB about anything. CSW explained reason for consult. MOB was welcoming, open, pleasant, and remained engaged during assessment. CSW and MOB discussed MOB's mental health history. MOB reported that she was diagnosed with anxiety and depression at age 31. MOB denied any current symptoms of anxiety/depression. MOB reported that she is not taking any medication to treat anxiety or depression. MOB reported that she is participating in therapy through [facility] which she finds helpful. MOB reported that if she needs additional mental health resources she can get them from [facility] or the pediatrician. CSW inquired about MOB's coping skills, MOB reported going outside. CSW positively affirmed MOB's healthy coping skill. MOB endorsed a history of postpartum depression. MOB described her postpartum depression as isolating, not wanting to get dressed, and random crying spells. MOB shared that she didn't know how to cope and a friend encouraged her to get assessed for PPD. MOB reported that she declined to take medication for PPD and instead went outside and exercised with her daughter. MOB reported that it helped a lot. CSW inquired about how MOB was feeling emotionally since giving birth, MOB reported that she is feeling good. MOB presented calm and possessed insight about her mental health history. MOB did not demonstrate any acute mental health signs/symptoms. CSW assessed for safety, MOB denied SI and HI. CSW did not assess for domestic violence as FOB was present. CSW inquired about MOB's support system, MOB reported that her Husband/FOB and family are supports.   CSW provided education regarding the baby blues period vs. perinatal mood disorders, discussed treatment and gave resources for mental health follow up if concerns  arise.  CSW recommends self-evaluation during the postpartum time period using the New Mom Checklist from Postpartum Progress and encouraged MOB to contact a medical professional if symptoms are noted at any time.    CSW provided review of Sudden Infant Death Syndrome (SIDS) precautions. MOB verbalized understanding and reported having all items needed to care for infant including a car seat and basinet.   CSW identifies no further need for intervention and no barriers to discharge at this time.  Abundio Miu, [place] Worker [facility] Cell#: [phone]

## 2021-09-07 LAB — SURGICAL PATHOLOGY

## 2021-09-10 ENCOUNTER — Ambulatory Visit (INDEPENDENT_AMBULATORY_CARE_PROVIDER_SITE_OTHER): Payer: Medicaid Other

## 2021-09-10 VITALS — BP 117/77 | HR 90

## 2021-09-10 DIAGNOSIS — E039 Hypothyroidism, unspecified: Secondary | ICD-10-CM

## 2021-09-10 NOTE — Progress Notes (Signed)
The patient is in the office for incision check, c-section was performed on 09/03/21. Dressings were previously removed by the patient at home  Patient's incision appears clean, dry, and intact. Per Dr. Bolivar Haw orders the patient also had blood work done today for a follow up TSH

## 2021-09-11 LAB — TSH: TSH: 2.78 u[IU]/mL (ref 0.450–4.500)

## 2021-09-30 ENCOUNTER — Encounter: Payer: Self-pay | Admitting: Obstetrics

## 2021-10-05 ENCOUNTER — Other Ambulatory Visit: Payer: Self-pay | Admitting: Family Medicine

## 2021-10-05 DIAGNOSIS — E039 Hypothyroidism, unspecified: Secondary | ICD-10-CM

## 2021-10-15 ENCOUNTER — Ambulatory Visit (INDEPENDENT_AMBULATORY_CARE_PROVIDER_SITE_OTHER): Payer: Medicaid Other | Admitting: Student

## 2021-10-15 DIAGNOSIS — M5430 Sciatica, unspecified side: Secondary | ICD-10-CM | POA: Diagnosis not present

## 2021-10-15 MED ORDER — MAGNESIUM OXIDE -MG SUPPLEMENT 200 MG PO TABS: 400.0000 mg | ORAL_TABLET | Freq: Every day | ORAL | 3 refills | Status: DC

## 2021-10-15 NOTE — Progress Notes (Signed)
[place] Partum Visit Note  Emma Robbins is a 31 y.o. G30P2012 female who presents for a postpartum visit. She is 6 weeks postpartum following a primary cesarean section.  I have fully reviewed the prenatal and intrapartum course. The delivery was at 28w1dgestational weeks.  Anesthesia: spinal. Postpartum course has been unremarkable. Baby is doing well. Baby is feeding by Enfamil Gentle Ease. Bleeding no bleeding. Bowel function is normal. Bladder function is normal. Patient is sexually active. Contraception method is tubal ligation. Postpartum depression screening: negative.  Pt has concerns about her incision healing properly. She also is c/o tingling and swelling in the leg since having her c-section. Denies any redness, warmth, pain, or SOB.  The pregnancy intention screening data noted above was reviewed. Potential methods of contraception were discussed. The patient elected to proceed with BTL at delivery    Health Maintenance Due  Topic Date Due   COVID-19 Vaccine (1) Never done   TETANUS/TDAP  Never done    The following portions of the patient's history were reviewed and updated as appropriate: allergies, current medications, past family history, past medical history, past social history, past surgical history, and problem list.  Review of Systems A comprehensive review of systems was negative except for: Integument/breast: positive for breast fullness; lactating Musculoskeletal: positive for left leg weakness, BLLE swelling Neurological: positive for weakness and tingling in lower right leg  Objective:  There were no vitals taken for this visit.   General:  alert, cooperative, and appears stated age   Breasts:  Normal, no redness or lumps  Lungs: clear to auscultation bilaterally  Heart:  regular rate and rhythm and S1, S2 normal  Abdomen: soft, non-tender; bowel sounds normal; no masses,  no organomegaly   Wound well approximated incision  GU exam:  not indicated        Assessment:    There are no diagnoses linked to this encounter.  Normal postpartum exam.   Plan:   Essential components of care per ACOG recommendations:  1.  Mood and well being: Patient with negative depression screening today. Reviewed local resources for support.  - Patient tobacco use? No.   - hx of drug use? No.    2. Infant care and feeding:  -Patient currently breastmilk feeding? Undecided. Patient states that they were pumping x1 month but decided to stop. Patient is interested in milk production cessation, but has allowed infant to attempt to feed at breast on occasion, no latch achieved. Discussed with patient the recommendations for stopping milk production and the importance of minimal breast stimulation. Encouraged cool cabbage leaves and tight fitting bras as well.  -Social determinants of health (SDOH) reviewed in EPIC. No concerns. The following needs were identified- none at this time  3. Sexuality, contraception and birth spacing - Patient does not want a pregnancy in the next year.  Desired family size is 2 children.  - Reviewed reproductive life planning. Reviewed contraceptive methods based on pt preferences and effectiveness.  Patient desired Female Sterilization today.   - Discussed birth spacing of 18 months  4. Sleep and fatigue -Encouraged family/partner/community support of 4 hrs of uninterrupted sleep to help with mood and fatigue  5. Physical Recovery  - Discussed patients delivery and complications. She describes her labor as good. - Patient had a C-section. Normal healing expectations reviewed. Patient expressed understanding - Patient has urinary incontinence? No. - Patient is safe to resume physical and sexual activity. Advised patient to advance activity as tolerated -  Patient with hx of sciatic nerve pain on right side. Occasional flare ups have been noted since delivery. Encouraged patient to seek follow-up w/ PCP if symptoms have not improved in  the next month. Will order Magnesium supplement in the meantime.  6.  Health Maintenance - HM due items addressed No - none indicated at this time - Last pap smear  Diagnosis  Date Value Ref Range Status  01/08/2020   Final   - Negative for intraepithelial lesion or malignancy (NILM)   Pap smear not done at today's visit.  -Breast Cancer screening indicated? No.   7. Chronic Disease/Pregnancy Condition follow up: None  - PCP follow up  Flonnie Hailstone, [place] for [place]

## 2021-11-13 ENCOUNTER — Encounter: Payer: Self-pay | Admitting: Surgery

## 2021-11-13 ENCOUNTER — Ambulatory Visit (INDEPENDENT_AMBULATORY_CARE_PROVIDER_SITE_OTHER): Payer: Medicaid Other | Admitting: Surgery

## 2021-11-13 ENCOUNTER — Ambulatory Visit: Payer: Self-pay

## 2021-11-13 ENCOUNTER — Other Ambulatory Visit: Payer: Self-pay | Admitting: Family Medicine

## 2021-11-13 VITALS — BP 116/84 | HR 88

## 2021-11-13 DIAGNOSIS — M545 Low back pain, unspecified: Secondary | ICD-10-CM | POA: Diagnosis not present

## 2021-11-13 DIAGNOSIS — E039 Hypothyroidism, unspecified: Secondary | ICD-10-CM

## 2021-11-13 DIAGNOSIS — M7062 Trochanteric bursitis, left hip: Secondary | ICD-10-CM

## 2021-11-13 MED ORDER — METHYLPREDNISOLONE 4 MG PO TABS: ORAL_TABLET | ORAL | 0 refills | Status: DC

## 2021-11-13 MED ORDER — METHOCARBAMOL 500 MG PO TABS: 500.0000 mg | ORAL_TABLET | Freq: Two times a day (BID) | ORAL | 0 refills | Status: DC | PRN

## 2021-11-13 NOTE — Progress Notes (Signed)
Office Visit Note   Patient: Emma Robbins           Date of Birth: 15-Sep-1990           MRN: [id number] Visit Date: 11/13/2021              Requested by: No referring provider defined for this encounter. PCP: Pcp, No   Assessment & Plan: Visit Diagnoses:  1. Acute left-sided low back pain, unspecified whether sciatica present   2. Greater trochanteric bursitis, left     Plan: At this point recommend conservative treatment.  I sent in prescriptions for Medrol Dosepak 6-day taper to be taken as directed along with Robaxin for spasms.  Again patient reassures me that she no longer is breast-feeding and she does not plan on doing this again.  I will also have her go to formal PT.  Follow-up with me in 3 weeks for recheck.  I will make decision at that time as whether or not lumbar MRI scan is needed.  If her lateral hip pain continues may also consider greater trochanter bursa injection.  Follow-Up Instructions: Return in about 3 weeks (around 12/04/2021) for with Kenlee Maler recheck lumbar spine and left lat hip pain.   Orders:  Orders Placed This Encounter  Procedures   XR Lumbar Spine 2-3 Views   XR Pelvis 1-2 Views   Ambulatory referral to Physical Therapy   Meds ordered this encounter  Medications   methylPREDNISolone (MEDROL) 4 MG tablet    Sig: 6 day taper to be taken as directed.    Dispense:  21 tablet    Refill:  0   methocarbamol (ROBAXIN) 500 MG tablet    Sig: Take 1 tablet (500 mg total) by mouth every 12 (twelve) hours as needed for muscle spasms.    Dispense:  30 tablet    Refill:  0      Procedures: No procedures performed   Clinical Data: No additional findings.   Subjective: Chief Complaint  Patient presents with   Lower Back - Pain   Left Leg - Pain    HPI 31 year old female who is new patient to clinic comes in today with complaints of low back pain and history of left and right lower extremity radiculopathy.  Patient states that she had low back  pain and right-sided sciatica during her pregnancy.  She had a C-section September 03, 2021.  She is continue to have ongoing symptoms.  Also having some left leg symptoms and feels at times that her leg gives away.  She was seen by OB and eventually referred here.  She has tried Tylenol without any improvement.  Patient states that she is no longer breast-feeding and does not use breast pump for feeding and does not plan on returning back to doing that.  Is not taking any oral NSAIDs. Review of Systems No current cardiopulmonary GI/GU issues  Objective: Vital Signs: BP 116/84   Pulse 88   Physical Exam HENT:     Head: Normocephalic and atraumatic.  Eyes:     Extraocular Movements: Extraocular movements intact.  Musculoskeletal:     Comments: Gait is slightly antalgic.  Patient has left lumbar paraspinal tenderness/spasm.  Positive left greater than right sciatic notch tenderness.  Positive left straight leg raise.  Tenderness over the left SI joint.  Moderate tenderness over the left hip greater trochanter bursa.  No focal motor deficits.  Neurological:     Mental Status: She is alert and oriented to person,  place, and time.  Psychiatric:        Mood and Affect: Mood normal.     Ortho Exam  Specialty Comments:  No specialty comments available.  Imaging: No results found.   PMFS History: Patient Active Problem List   Diagnosis Date Noted   Malpresentation before onset of labor, fetus 1 09/03/2021   Cesarean delivery delivered 09/03/2021   Unwanted fertility 08/03/2021   Malpresentation of fetus 08/03/2021   Nausea and vomiting in pregnancy 03/18/2021   Encounter for supervision of normal pregnancy 02/04/2021   Adjustment disorder with mixed anxiety and depressed mood 08/10/2017   Acquired hypothyroidism 07/14/2009   Past Medical History:  Diagnosis Date   Anxiety    Depression    Gallstone 11/2011   Headache(784.0)    unspecified - 2-3 x/week   Hemorrhoids     Hypothyroidism    Thyroid disease     Family History  Problem Relation Age of Onset   Hyperlipidemia Father    Thyroid disease Father    Diabetes Father    Thyroid disease Brother    Thyroid disease Sister    Hypertension Maternal Grandmother    Diabetes Paternal Grandmother    Hyperlipidemia Paternal Grandmother    Cancer Paternal Grandfather        Prostate    Past Surgical History:  Procedure Laterality Date   BREAST SURGERY  2012   Fibroadenoma   CESAREAN SECTION N/A 09/03/2021   Procedure: CESAREAN SECTION;  Surgeon: Sloan Leiter, MD;  Location: [facility];  Service: Obstetrics;  Laterality: N/A;   CHOLECYSTECTOMY  12/09/2011   Procedure: LAPAROSCOPIC CHOLECYSTECTOMY;  Surgeon: Haywood Lasso, MD;  Location: [place];  Service: General;  Laterality: N/A;   LAPAROSCOPIC OVARIAN CYSTECTOMY  12/16/10   Right cystadenofibroma   Social History   Occupational History   Not on file  Tobacco Use   Smoking status: Never   Smokeless tobacco: Never  Vaping Use   Vaping Use: Never used  Substance and Sexual Activity   Alcohol use: Not Currently    Alcohol/week: 3.0 standard drinks of alcohol    Types: 3 Standard drinks or equivalent per week    Comment: Rare   Drug use: Not Currently    Types: Cocaine    Comment: per pt did Cocaine 2 yrs ago.05-2017   Sexual activity: Yes    Birth control/protection: None    Comment: Currently pregnant

## 2021-11-16 ENCOUNTER — Encounter: Payer: Self-pay | Admitting: *Deleted

## 2021-11-16 ENCOUNTER — Telehealth: Payer: Self-pay | Admitting: *Deleted

## 2021-11-16 DIAGNOSIS — E039 Hypothyroidism, unspecified: Secondary | ICD-10-CM

## 2021-11-16 MED ORDER — LEVOTHYROXINE SODIUM 75 MCG PO TABS: ORAL_TABLET | ORAL | 0 refills | Status: AC

## 2021-11-16 NOTE — Telephone Encounter (Signed)
Called pt in regards to refill request fax received from [facility] for Levothyroxine. Pt was advised that she will need to follow up with PCP for management of her thyroid disease and prescription.  She stated that she does not currently have a PCP. I advised pt that I will send in a one month supply refill and also will send a Mychart message with information of some locations where she may establish PCP care. Pt voiced understanding.

## 2021-12-03 NOTE — Telephone Encounter (Signed)
Called pt twice. Gets no answer and have left 2 voicemails. She needs to get scheduled at the [address] location because we do not take medicaid.

## 2021-12-08 ENCOUNTER — Ambulatory Visit: Payer: Medicaid Other | Admitting: Obstetrics & Gynecology

## 2021-12-10 ENCOUNTER — Ambulatory Visit (INDEPENDENT_AMBULATORY_CARE_PROVIDER_SITE_OTHER): Payer: Medicaid Other | Admitting: Surgery

## 2021-12-10 ENCOUNTER — Encounter: Payer: Self-pay | Admitting: Surgery

## 2021-12-10 VITALS — BP 120/83 | HR 80 | Ht 64.0 in | Wt 206.2 lb

## 2021-12-10 DIAGNOSIS — M545 Low back pain, unspecified: Secondary | ICD-10-CM | POA: Diagnosis not present

## 2021-12-10 DIAGNOSIS — M7062 Trochanteric bursitis, left hip: Secondary | ICD-10-CM

## 2021-12-10 NOTE — Progress Notes (Signed)
Office Visit Note   Patient: Emma Robbins           Date of Birth: July 01, 1990           MRN: [id number] Visit Date: 12/10/2021              Requested by: No referring provider defined for this encounter. PCP: Pcp, No   Assessment & Plan: Visit Diagnoses:  1. Greater trochanteric bursitis, left   2. Acute left-sided low back pain, unspecified whether sciatica present     Plan: Today offered conservative treatment of left lateral hip pain with injection.  After patient consent left lateral hip was prepped with Betadine and greater trochanter bursa Marcaine/Depo-Medrol injection was performed.  Tolerated well without complication.  After sitting for few minutes patient reported excellent relief with anesthetic in place.  Recommend that she still go to physical therapy and they can also add on IT band stretching exercises.  Follow-up with me in 4 weeks for recheck.  Follow-Up Instructions: Return in about 4 weeks (around 01/07/2022) for with Emma Robbins recheck lumbar and left greater trochanteric bursitis.   Orders:  Orders Placed This Encounter  Procedures   Large Joint Inj   No orders of the defined types were placed in this encounter.     Procedures: Large Joint Inj: L greater trochanter on 12/10/2021 1:27 PM Indications: pain Details: 22 G 1.5 in needle, lateral approach Medications: 3 mL lidocaine 1 %; 6 mL bupivacaine 0.5 %; 160 mg methylPREDNISolone acetate 80 MG/ML Outcome: tolerated well, no immediate complications Patient was prepped and draped in the usual sterile fashion.      Clinical Data: No additional findings.   Subjective: Chief Complaint  Patient presents with   Lower Back - Pain   Left Hip - Pain    HPI 31 year old female returns for recheck.  She has not started formal PT yet.  There has been some issues with scheduling.  States that today most of her pain is left lateral hip around the greater trochanter bursa.  Did complete the prednisone taper  and states that this did help with her back symptoms. Review of Systems No current complaints of cardiopulmonary GI/GU issues  Objective: Vital Signs: BP 120/83   Pulse 80   Ht '5\' 4"'$  (1.626 m)   Wt 206 lb 3.2 oz (93.5 kg)   BMI 35.39 kg/m   Physical Exam HENT:     Head: Normocephalic and atraumatic.     Nose: Nose normal.  Eyes:     Extraocular Movements: Extraocular movements intact.  Musculoskeletal:     Comments: Gait is somewhat antalgic.  There is moderate to marked tenderness over the left hip greater trochanter bursa.  Negative logroll.  Neurological:     Mental Status: She is alert and oriented to person, place, and time.  Psychiatric:        Mood and Affect: Mood normal.     Ortho Exam  Specialty Comments:  No specialty comments available.  Imaging: No results found.   PMFS History: Patient Active Problem List   Diagnosis Date Noted   Malpresentation before onset of labor, fetus 1 09/03/2021   Cesarean delivery delivered 09/03/2021   Unwanted fertility 08/03/2021   Malpresentation of fetus 08/03/2021   Nausea and vomiting in pregnancy 03/18/2021   Encounter for supervision of normal pregnancy 02/04/2021   Adjustment disorder with mixed anxiety and depressed mood 08/10/2017   Acquired hypothyroidism 07/14/2009   Past Medical History:  Diagnosis Date  Anxiety    Depression    Gallstone 11/2011   Headache(784.0)    unspecified - 2-3 x/week   Hemorrhoids    Hypothyroidism    Thyroid disease     Family History  Problem Relation Age of Onset   Hyperlipidemia Father    Thyroid disease Father    Diabetes Father    Thyroid disease Brother    Thyroid disease Sister    Hypertension Maternal Grandmother    Diabetes Paternal Grandmother    Hyperlipidemia Paternal Grandmother    Cancer Paternal Grandfather        Prostate    Past Surgical History:  Procedure Laterality Date   BREAST SURGERY  2012   Fibroadenoma   CESAREAN SECTION N/A 09/03/2021    Procedure: CESAREAN SECTION;  Surgeon: Sloan Leiter, MD;  Location: [facility];  Service: Obstetrics;  Laterality: N/A;   CHOLECYSTECTOMY  12/09/2011   Procedure: LAPAROSCOPIC CHOLECYSTECTOMY;  Surgeon: Haywood Lasso, MD;  Location: [place];  Service: General;  Laterality: N/A;   LAPAROSCOPIC OVARIAN CYSTECTOMY  12/16/10   Right cystadenofibroma   Social History   Occupational History   Not on file  Tobacco Use   Smoking status: Never   Smokeless tobacco: Never  Vaping Use   Vaping Use: Never used  Substance and Sexual Activity   Alcohol use: Not Currently    Alcohol/week: 3.0 standard drinks of alcohol    Types: 3 Standard drinks or equivalent per week    Comment: Rare   Drug use: Not Currently    Types: Cocaine    Comment: per pt did Cocaine 2 yrs ago.05-2017   Sexual activity: Yes    Birth control/protection: None    Comment: Currently pregnant

## 2021-12-15 MED ORDER — LIDOCAINE HCL 1 % IJ SOLN
3.0000 mL | INTRAMUSCULAR | Status: AC | PRN
  Administered 2021-12-10: 3 mL

## 2021-12-15 MED ORDER — METHYLPREDNISOLONE ACETATE 80 MG/ML IJ SUSP
160.0000 mg | INTRAMUSCULAR | Status: AC | PRN
  Administered 2021-12-10: 160 mg via INTRA_ARTICULAR

## 2021-12-15 MED ORDER — BUPIVACAINE HCL 0.5 % IJ SOLN
6.0000 mL | INTRAMUSCULAR | Status: AC | PRN
  Administered 2021-12-10: 6 mL via INTRA_ARTICULAR

## 2022-01-07 ENCOUNTER — Ambulatory Visit: Payer: Medicaid Other | Admitting: Surgery

## 2022-03-26 ENCOUNTER — Ambulatory Visit: Payer: Medicaid Other | Admitting: Physical Therapy

## 2022-03-29 ENCOUNTER — Other Ambulatory Visit: Payer: Self-pay

## 2022-03-29 ENCOUNTER — Emergency Department (HOSPITAL_COMMUNITY)
Admission: EM | Admit: 2022-03-29 | Discharge: 2022-03-29 | Disposition: A | Discharge status: Home / Self Care | Payer: Medicaid Other | Attending: Emergency Medicine | Admitting: Emergency Medicine

## 2022-03-29 ENCOUNTER — Encounter (HOSPITAL_COMMUNITY): Payer: Self-pay | Admitting: Emergency Medicine

## 2022-03-29 ENCOUNTER — Emergency Department (HOSPITAL_COMMUNITY): Payer: Medicaid Other

## 2022-03-29 DIAGNOSIS — Z1152 Encounter for screening for COVID-19: Secondary | ICD-10-CM | POA: Insufficient documentation

## 2022-03-29 DIAGNOSIS — R059 Cough, unspecified: Secondary | ICD-10-CM | POA: Diagnosis present

## 2022-03-29 DIAGNOSIS — J101 Influenza due to other identified influenza virus with other respiratory manifestations: Secondary | ICD-10-CM | POA: Diagnosis not present

## 2022-03-29 LAB — RESP PANEL BY RT-PCR (RSV, FLU A&B, COVID)  RVPGX2
Influenza A by PCR: POSITIVE — AB
Influenza B by PCR: POSITIVE — AB
Resp Syncytial Virus by PCR: NEGATIVE
SARS Coronavirus 2 by RT PCR: NEGATIVE

## 2022-03-29 MED ORDER — ACETAMINOPHEN 500 MG PO TABS
1000.0000 mg | ORAL_TABLET | Freq: Once | ORAL | Status: AC
  Administered 2022-03-29: 1000 mg via ORAL
  Filled 2022-03-29: qty 2

## 2022-03-29 NOTE — Discharge Instructions (Signed)
Use honey and lemon as needed for cough. Stay well-hydrated with fluids. Use Tylenol every 4 hours and ibuprofen every 6 hours as needed for body aches pain or fever. Return for breathing difficulty or new concerns.

## 2022-03-29 NOTE — ED Triage Notes (Signed)
Patient with cough, fever and body aches beginning the 12/27. Per patient, she went to the local [place] store and bought amoxicillin without a prescription and started it. UTD on vaccinations.

## 2022-03-29 NOTE — ED Provider Notes (Signed)
[facility] EMERGENCY DEPARTMENT Provider Note   CSN: [id number] Arrival date & time: 03/29/22  1607     History  Chief Complaint  Patient presents with   Cough   Fever   Generalized Body Aches    Emma Robbins is a 32 y.o. female.  Patient with no significant medical history presents with cough fever body aches since December 27.  Patient's children have similar symptoms.  Patient started amoxicillin from a local [place] store without prescription.  Patient is up-to-date on vaccinations.  No lung disease history.  Intermittent fevers that stopped yesterday.       Home Medications Prior to Admission medications   Medication Sig Start Date End Date Taking? Authorizing Provider  acetaminophen (TYLENOL) 500 MG tablet Take 2 tablets (1,000 mg total) by mouth every 6 (six) hours as needed. 09/05/21   Myrtis Ser, CNM  ibuprofen (ADVIL) 600 MG tablet Take 1 tablet (600 mg total) by mouth every 6 (six) hours as needed. Patient not taking: Reported on 10/15/2021 09/05/21   Myrtis Ser, CNM  levothyroxine (SYNTHROID) 75 MCG tablet TAKE 1 TABLET(75 MCG) BY MOUTH DAILY 11/16/21   Donnamae Jude, MD  Magnesium Oxide -Mg Supplement (MAG-OXIDE) 200 MG TABS Take 2 tablets (400 mg total) by mouth at bedtime. If that amount causes loose stools in the am, switch to '200mg'$  daily at bedtime. 10/15/21   Johnston Ebbs, NP  methocarbamol (ROBAXIN) 500 MG tablet Take 1 tablet (500 mg total) by mouth every 12 (twelve) hours as needed for muscle spasms. 11/13/21   Lanae Crumbly, PA-C  methylPREDNISolone (MEDROL) 4 MG tablet 6 day taper to be taken as directed. 11/13/21   Lanae Crumbly, PA-C  oxyCODONE (OXY IR/ROXICODONE) 5 MG immediate release tablet Take 1-2 tablets (5-10 mg total) by mouth every 6 (six) hours as needed for severe pain. Patient not taking: Reported on 10/15/2021 09/05/21   Myrtis Ser, CNM  Prenatal Vit-Fe Phos-FA-Omega (VITAFOL GUMMIES) 3.33-0.333-34.8 MG CHEW  Chew 3 tablets by mouth daily. Patient not taking: Reported on 11/13/2021 01/20/21   Elvera Maria, CNM      Allergies    Patient has no known allergies.    Review of Systems   Review of Systems  Unable to perform ROS: Age    Physical Exam Updated Vital Signs BP (!) 108/93   Pulse (!) 112   Temp 98.6 F (37 C)   Resp 15   LMP 03/21/2022   SpO2 98%   Breastfeeding No  Physical Exam Vitals and nursing note reviewed.  Constitutional:      General: She is not in acute distress.    Appearance: She is well-developed.  HENT:     Head: Normocephalic and atraumatic.     Nose: Congestion and rhinorrhea present.     Mouth/Throat:     Mouth: Mucous membranes are moist.  Eyes:     General:        Right eye: No discharge.        Left eye: No discharge.     Conjunctiva/sclera: Conjunctivae normal.  Neck:     Trachea: No tracheal deviation.  Cardiovascular:     Rate and Rhythm: Normal rate and regular rhythm.     Heart sounds: No murmur heard. Pulmonary:     Effort: Pulmonary effort is normal.     Breath sounds: Normal breath sounds.  Abdominal:     General: There is no distension.     Palpations:  Abdomen is soft.     Tenderness: There is no abdominal tenderness. There is no guarding.  Musculoskeletal:     Cervical back: Normal range of motion and neck supple. No rigidity.  Skin:    General: Skin is warm.     Capillary Refill: Capillary refill takes less than 2 seconds.     Findings: No rash.  Neurological:     General: No focal deficit present.     Mental Status: She is alert.     Cranial Nerves: No cranial nerve deficit.  Psychiatric:        Mood and Affect: Mood normal.     ED Results / Procedures / Treatments   Labs (all labs ordered are listed, but only abnormal results are displayed) Labs Reviewed  RESP PANEL BY RT-PCR (RSV, FLU A&B, COVID)  RVPGX2 - Abnormal; Notable for the following components:      Result Value   Influenza A by PCR POSITIVE (*)     Influenza B by PCR POSITIVE (*)    All other components within normal limits    EKG None  Radiology DG Chest 2 View  Result Date: 03/29/2022 CLINICAL DATA:  Cough.  Pain.  Shortness of breath.  Fever. EXAM: CHEST - 2 VIEW COMPARISON:  Chest two views 01/19/2011 FINDINGS: The heart size and mediastinal contours are within normal limits. Both lungs are clear. The visualized skeletal structures are unremarkable. Cholecystectomy clips. IMPRESSION: No active cardiopulmonary disease. Electronically Signed   By: Yvonne Kendall M.D.   On: 03/29/2022 17:26    Procedures Procedures    Medications Ordered in ED Medications  acetaminophen (TYLENOL) tablet 1,000 mg (1,000 mg Oral Given 03/29/22 1650)    ED Course/ Medical Decision Making/ A&P                           Medical Decision Making Amount and/or Complexity of Data Reviewed Radiology: ordered.  Risk OTC drugs.   Patient presents with persistent respiratory symptoms flulike illness and mild tachycardia secondary to mild dehydration and bodyaches.  Patient has no signs of serious bacterial infection such as meningitis or encephalitis.  Lungs overall clear, chest x-ray ordered due to persistent symptoms to look for secondary bacterial pneumonia and it was reviewed and negative.  Viral test returned showing influenza and influenza B positive.  Patient stable for continued supportive care and outpatient follow-up.        Final Clinical Impression(s) / ED Diagnoses Final diagnoses:  Influenza A  Influenza B  Cough in adult    Rx / DC Orders ED Discharge Orders     None         Elnora Morrison, MD 03/29/22 1815

## 2022-03-29 NOTE — ED Notes (Signed)
Patient transported to X-ray 

## 2022-07-14 ENCOUNTER — Ambulatory Visit (INDEPENDENT_AMBULATORY_CARE_PROVIDER_SITE_OTHER): Payer: Medicaid Other | Admitting: Obstetrics & Gynecology

## 2022-07-14 ENCOUNTER — Encounter: Payer: Self-pay | Admitting: Obstetrics & Gynecology

## 2022-07-14 ENCOUNTER — Other Ambulatory Visit (HOSPITAL_COMMUNITY)
Admission: RE | Admit: 2022-07-14 | Discharge: 2022-07-14 | Disposition: A | Discharge status: Home / Self Care | Payer: Medicaid Other | Source: Ambulatory Visit | Attending: Obstetrics & Gynecology | Admitting: Obstetrics & Gynecology

## 2022-07-14 VITALS — BP 114/77 | HR 92 | Ht 64.0 in | Wt 230.0 lb

## 2022-07-14 DIAGNOSIS — R102 Pelvic and perineal pain: Secondary | ICD-10-CM

## 2022-07-14 MED ORDER — METRONIDAZOLE 500 MG PO TABS: 500.0000 mg | ORAL_TABLET | Freq: Two times a day (BID) | ORAL | 0 refills | Status: DC

## 2022-07-14 NOTE — Progress Notes (Signed)
Patient ID: Emma Robbins, female   DOB: 02-17-1991, 32 y.o.   MRN: [id number]  Cc: vaginal discharge and spotting and pain  HPI Emma Robbins is a 32 y.o. female.  [id number] Patient's last menstrual period was 06/28/2022. Not breastfeeding, infant is 8 months. 3 months of pelvic pain, spotting and discharge and recent dyspareunia, no fever HPI  Past Medical History:  Diagnosis Date   Anxiety    Depression    Gallstone 11/2011   Headache(784.0)    unspecified - 2-3 x/week   Hemorrhoids    Hypothyroidism    Thyroid disease     Past Surgical History:  Procedure Laterality Date   BREAST SURGERY  03/29/2010   Fibroadenoma   CESAREAN SECTION N/A 09/03/2021   Procedure: CESAREAN SECTION;  Surgeon: Conan Bowens, MD;  Location: [facility];  Service: Obstetrics;  Laterality: N/A;   CESAREAN SECTION WITH BILATERAL TUBAL LIGATION  09/03/2021   CHOLECYSTECTOMY  12/09/2011   Procedure: LAPAROSCOPIC CHOLECYSTECTOMY;  Surgeon: Currie Paris, MD;  Location: [facility];  Service: General;  Laterality: N/A;   LAPAROSCOPIC OVARIAN CYSTECTOMY  12/16/2010   Right cystadenofibroma    Family History  Problem Relation Age of Onset   Hyperlipidemia Father    Thyroid disease Father    Diabetes Father    Thyroid disease Brother    Thyroid disease Sister    Hypertension Maternal Grandmother    Diabetes Paternal Grandmother    Hyperlipidemia Paternal Grandmother    Cancer Paternal Grandfather        Prostate    Social History Social History   Tobacco Use   Smoking status: Never    Passive exposure: Never   Smokeless tobacco: Never  Vaping Use   Vaping Use: Never used  Substance Use Topics   Alcohol use: Not Currently    Alcohol/week: 3.0 standard drinks of alcohol    Types: 3 Standard drinks or equivalent per week    Comment: Rare   Drug use: Not Currently    Types: Cocaine    Comment: per pt did Cocaine 2 yrs ago.05-2017    No Known Allergies  Current  Outpatient Medications  Medication Sig Dispense Refill   levothyroxine (SYNTHROID) 75 MCG tablet TAKE 1 TABLET(75 MCG) BY MOUTH DAILY 30 tablet 0   Magnesium Oxide -Mg Supplement (MAG-OXIDE) 200 MG TABS Take 2 tablets (400 mg total) by mouth at bedtime. If that amount causes loose stools in the am, switch to  daily at bedtime. 60 tablet 3   methocarbamol (ROBAXIN) 500 MG tablet Take 1 tablet (500 mg total) by mouth every 12 (twelve) hours as needed for muscle spasms. 30 tablet 0   metroNIDAZOLE (FLAGYL) 500 MG tablet Take 1 tablet (500 mg total) by mouth 2 (two) times daily. 14 tablet 0   Multiple Vitamin (MULTIVITAMIN) tablet Take 1 tablet by mouth daily.     acetaminophen (TYLENOL) 500 MG tablet Take 2 tablets (1,000 mg total) by mouth every 6 (six) hours as needed. (Patient not taking: Reported on 07/14/2022) 30 tablet 0   ibuprofen (ADVIL) 600 MG tablet Take 1 tablet (600 mg total) by mouth every 6 (six) hours as needed. 30 tablet 0   methylPREDNISolone (MEDROL) 4 MG tablet 6 day taper to be taken as directed. 21 tablet 0   oxyCODONE (OXY IR/ROXICODONE) 5 MG immediate release tablet Take 1-2 tablets (5-10 mg total) by mouth every 6 (six) hours as needed for severe pain. (Patient not taking: Reported on 10/15/2021)  30 tablet 0   Prenatal Vit-Fe Phos-FA-Omega (VITAFOL GUMMIES) 3.33-0.333-34.8 MG CHEW Chew 3 tablets by mouth daily. 90 tablet 6   No current facility-administered medications for this visit.    Review of Systems Review of Systems  Constitutional: Negative.   Respiratory: Negative.    Gastrointestinal:  Positive for abdominal pain.  Genitourinary:  Positive for dyspareunia, pelvic pain and vaginal discharge.  Musculoskeletal:  Positive for arthralgias.    Blood pressure 114/77, pulse 92, height  (1.626 m), weight 230 lb (104.3 kg), last menstrual period 06/28/2022, not currently breastfeeding.  Physical Exam Physical Exam Vitals and nursing note reviewed. Exam  conducted with a chaperone present.  Constitutional:      Appearance: Normal appearance. She is not ill-appearing.  Cardiovascular:     Rate and Rhythm: Normal rate.  Pulmonary:     Effort: Pulmonary effort is normal.  Genitourinary:    General: Normal vulva.     Exam position: Lithotomy position.     Vagina: Vaginal discharge (yellow) present.     Cervix: Normal.     Uterus: Normal.      Adnexa: Right adnexa normal and left adnexa normal.  Skin:    General: Skin is warm and dry.  Neurological:     Mental Status: She is alert.  Psychiatric:        Mood and Affect: Mood normal.        Behavior: Behavior normal.     Data Reviewed Pap result  Assessment Pelvic pain - Plan: Cytology - PAP, Cervicovaginal ancillary only( [place]), Amb Referral to Nutrition and Diabetic Education, Ambulatory referral to Integrated Behavioral Health, US PELVIC COMPLETE WITH TRANSVAGINAL, metroNIDAZOLE (FLAGYL) 500 MG tablet Possible BV or trichomonas  Plan Meds ordered this encounter  Medications   metroNIDAZOLE (FLAGYL) 500 MG tablet    Sig: Take 1 tablet (500 mg total) by mouth 2 (two) times daily.    Dispense:  14 tablet    Refill:  0   Orders Placed This Encounter  Procedures   US PELVIC COMPLETE WITH TRANSVAGINAL    Standing Status:   Future    Standing Expiration Date:   10/13/2022    Order Specific Question:   Reason for Exam (SYMPTOM  OR DIAGNOSIS REQUIRED)    Answer:   pelvic pain    Order Specific Question:   Preferred imaging location?    Answer:   [facility]   Amb Referral to Nutrition and Diabetic Education    Referral Priority:   Routine    Referral Type:   Consultation    Referral Reason:   Specialty Services Required    Number of Visits Requested:   1   Ambulatory referral to Integrated Behavioral Health    Referral Priority:   Routine    Referral Type:   Consultation    Referral Reason:   Specialty Services Required    Number of Visits Requested:   1    RTC prn, f/u on labs    Scheryl Darter 07/14/2022, 3:03 PM

## 2022-07-14 NOTE — Progress Notes (Signed)
Reports AUB-menses lasting 10 days, spotting in b/w menses. Reports "a lot" of bleeding after intercourse recently. Still spotting now. Reports pain with intercourse.  Hx multiple cysts on ovaries in her 20's. One very large cyst on right ovary. Reports " they wanted to take my whole uterus when I was in my 20's".  Wants to discuss weight loss options. Sts she is "close to pre diabetic."

## 2022-07-16 LAB — CERVICOVAGINAL ANCILLARY ONLY
Bacterial Vaginitis (gardnerella): POSITIVE — AB
Candida Glabrata: NEGATIVE
Candida Vaginitis: NEGATIVE
Chlamydia: NEGATIVE
Comment: NEGATIVE
Comment: NEGATIVE
Comment: NEGATIVE
Comment: NEGATIVE
Comment: NEGATIVE
Comment: NORMAL
Neisseria Gonorrhea: NEGATIVE
Trichomonas: NEGATIVE

## 2022-07-19 ENCOUNTER — Telehealth: Payer: Self-pay | Admitting: Licensed Clinical Social Worker

## 2022-07-19 ENCOUNTER — Ambulatory Visit (HOSPITAL_COMMUNITY)
Admission: RE | Admit: 2022-07-19 | Discharge: 2022-07-19 | Disposition: A | Discharge status: Home / Self Care | Payer: Medicaid Other | Source: Ambulatory Visit | Attending: Obstetrics & Gynecology | Admitting: Obstetrics & Gynecology

## 2022-07-19 DIAGNOSIS — R102 Pelvic and perineal pain: Secondary | ICD-10-CM | POA: Diagnosis present

## 2022-07-19 LAB — CYTOLOGY - PAP
Comment: NEGATIVE
Diagnosis: NEGATIVE
High risk HPV: NEGATIVE

## 2022-07-19 NOTE — Telephone Encounter (Signed)
Called Ms. Level twice left one detail voice message requesting callback. LCSW A Mykelle Cockerell called Ms. Yanko to change scheduled mychart from 07/21/2022 to Thursday.

## 2022-07-21 ENCOUNTER — Institutional Professional Consult (permissible substitution): Payer: Self-pay | Admitting: Licensed Clinical Social Worker

## 2022-07-21 ENCOUNTER — Encounter: Payer: Self-pay | Admitting: Obstetrics & Gynecology

## 2022-07-27 ENCOUNTER — Ambulatory Visit (INDEPENDENT_AMBULATORY_CARE_PROVIDER_SITE_OTHER): Payer: Medicaid Other | Admitting: Licensed Clinical Social Worker

## 2022-07-27 DIAGNOSIS — F419 Anxiety disorder, unspecified: Secondary | ICD-10-CM | POA: Diagnosis not present

## 2022-07-30 NOTE — BH Specialist Note (Signed)
Integrated Behavioral Health via Telemedicine Visit  07/30/2022 Emma Robbins [id number]  Number of Integrated Behavioral Health Clinician visits: 3 Session Start time:  3:00pm Session End time: 3:52pm Total time in minutes: 52 mins via phone per pt request   Referring Provider: Dr Debroah Loop  Patient/Family location: Home [facility] Provider location: [facility]  All persons participating in visit: Pt B Emma Robbins and LCSW A Emma Robbins  Types of Service: Individual psychotherapy and Telephone visit  I connected with Emma Robbins and/or Emma Robbins n/a via  Telephone or Video Enabled Telemedicine Application  (Video is Caregility application) and verified that I am speaking with the correct person using two identifiers. Discussed confidentiality: Yes   I discussed the limitations of telemedicine and the availability of in person appointments.  Discussed there is a possibility of technology failure and discussed alternative modes of communication if that failure occurs.  I discussed that engaging in this telemedicine visit, they consent to the provision of behavioral healthcare and the services will be billed under their insurance.  Patient and/or legal guardian expressed understanding and consented to Telemedicine visit: Yes   Presenting Concerns: Patient and/or family reports the following symptoms/concerns: elevated phq9, depressed mood and trouble sleeping  Duration of problem: approx 3 months; Severity of problem: mild  Patient and/or Family's Strengths/Protective Factors: Concrete supports in place (healthy food, safe environments, etc.)  Goals Addressed: Patient will:  Reduce symptoms of: anxiety   Increase knowledge and/or ability of: coping skills   Demonstrate ability to: Increase healthy adjustment to current life circumstances  Progress towards Goals: Ongoing  Interventions: Interventions utilized:  Motivational Interviewing and Supportive Counseling Standardized Assessments  completed: PHQ 9  Patient and/or Family Response: Emma Robbins reports feeling stress, down, difficulty with focus and sleep  Assessment: Patient currently experiencing anxiety.   Patient may benefit from integrated behavioral health.  Plan: Follow up with behavioral health clinician on : 2 weeks mychart  Behavioral recommendations: mindfulness, prioritizing rest, self care and delegating task to prevent burnout  Referral(s): Integrated [employer] (In Clinic)  I discussed the assessment and treatment plan with the patient and/or parent/guardian. They were provided an opportunity to ask questions and all were answered. They agreed with the plan and demonstrated an understanding of the instructions.   They were advised to call back or seek an in-person evaluation if the symptoms worsen or if the condition fails to improve as anticipated.  Emma Saxon, LCSW

## 2023-03-25 ENCOUNTER — Other Ambulatory Visit: Payer: Self-pay

## 2023-03-25 ENCOUNTER — Ambulatory Visit
Admission: EM | Admit: 2023-03-25 | Discharge: 2023-03-25 | Disposition: A | Discharge status: Home / Self Care | Payer: Medicaid Other | Attending: Physician Assistant | Admitting: Physician Assistant

## 2023-03-25 VITALS — BP 112/77 | HR 75 | Temp 98.0°F | Resp 16

## 2023-03-25 DIAGNOSIS — R1030 Lower abdominal pain, unspecified: Secondary | ICD-10-CM

## 2023-03-25 DIAGNOSIS — N390 Urinary tract infection, site not specified: Secondary | ICD-10-CM | POA: Diagnosis present

## 2023-03-25 DIAGNOSIS — L309 Dermatitis, unspecified: Secondary | ICD-10-CM | POA: Diagnosis present

## 2023-03-25 DIAGNOSIS — N898 Other specified noninflammatory disorders of vagina: Secondary | ICD-10-CM | POA: Diagnosis present

## 2023-03-25 LAB — POCT URINALYSIS DIP (MANUAL ENTRY)
Bilirubin, UA: NEGATIVE
Blood, UA: NEGATIVE
Glucose, UA: NEGATIVE mg/dL
Ketones, POC UA: NEGATIVE mg/dL
Nitrite, UA: NEGATIVE
Protein Ur, POC: NEGATIVE mg/dL
Spec Grav, UA: 1.025 (ref 1.010–1.025)
Urobilinogen, UA: 0.2 U/dL
pH, UA: 6.5 (ref 5.0–8.0)

## 2023-03-25 MED ORDER — NITROFURANTOIN MONOHYD MACRO 100 MG PO CAPS: 100.0000 mg | ORAL_CAPSULE | Freq: Two times a day (BID) | ORAL | 0 refills | Status: DC

## 2023-03-25 MED ORDER — TRIAMCINOLONE ACETONIDE 0.1 % EX CREA: 1.0000 | TOPICAL_CREAM | Freq: Two times a day (BID) | CUTANEOUS | 0 refills | Status: DC

## 2023-03-25 MED ORDER — CEFTRIAXONE SODIUM 1 G IJ SOLR
1.0000 g | Freq: Once | INTRAMUSCULAR | Status: AC
  Administered 2023-03-25: 1 g via INTRAMUSCULAR

## 2023-03-25 NOTE — Discharge Instructions (Signed)
We are treating you for urinary tract infection.  We will contact you if we need to change your treatment plan based on your urine culture or swab results.  Please take Macrobid twice daily for 5 days.  We gave you an injection of antibiotics today.  Make sure you rest and drink plenty of fluid.  If your symptoms are not improving or if anything worsens and you have high fever, worsening abdominal pain, nausea, vomiting you should be seen immediately.  Apply triamcinolone twice daily to the areas of rash.  Keep these areas clean with soap and water.  If anything changes or worsens and you have spread of rash or additional symptoms please return for reevaluation.

## 2023-03-25 NOTE — ED Provider Notes (Signed)
[facility]    CSN: [id number] Arrival date & time: 03/25/23  1354      History   Chief Complaint Chief Complaint  Patient presents with   Dysuria    HPI Emma Robbins is a 32 y.o. female.   Patient presents today with several concerns.  Her primary concern today is worsening UTI symptoms.  She reports symptoms have been present for at least a week but potentially longer.  She reports a malodorous urine, dysuria, frequency, urgency, lower abdominal pain.  Denies any pelvic pain, abnormal uterine bleeding but has noticed some yellow vaginal discharge.  She has no concern for STI but is open to testing.  She denies history of recurrent UTI, nephrolithiasis, recent urogenital procedure, catheterization.  She has not tried any over-the-counter medication for symptom management.  Denies history of diabetes and does not take SGLT2 inhibitor.  Denies any recent antibiotic use.    In addition, she reports a several day history of pruritic rash on her left lateral neck and right upper abdomen/inferior chest.  She denies any changes to personal hygiene products including soaps or detergents.  She has applied over-the-counter anti-itch cream with minimal improvement of symptoms.  Does report that her son is covered in poison ivy and it is possible she was exposed to it through him.  She denies any additional exposure to plants, insects, animals.    Past Medical History:  Diagnosis Date   Adjustment disorder with mixed anxiety and depressed mood 08/10/2017   Anxiety    Depression    Gallstone 11/2011   Headache(784.0)    unspecified - 2-3 x/week   Hemorrhoids    Hypothyroidism    Thyroid disease     Patient Active Problem List   Diagnosis Date Noted   Acquired hypothyroidism 07/14/2009    Past Surgical History:  Procedure Laterality Date   BREAST SURGERY  03/29/2010   Fibroadenoma   CESAREAN SECTION N/A 09/03/2021   Procedure: CESAREAN SECTION;  Surgeon: Conan Bowens, MD;  Location: [facility];  Service: Obstetrics;  Laterality: N/A;   CESAREAN SECTION WITH BILATERAL TUBAL LIGATION  09/03/2021   CHOLECYSTECTOMY  12/09/2011   Procedure: LAPAROSCOPIC CHOLECYSTECTOMY;  Surgeon: Currie Paris, MD;  Location: [facility];  Service: General;  Laterality: N/A;   LAPAROSCOPIC OVARIAN CYSTECTOMY  12/16/2010   Right cystadenofibroma    OB History     Gravida  3   Para  2   Term  2   Preterm      AB  1   Living  2      SAB  1   IAB      Ectopic      Multiple  0   Live Births  2            Home Medications    Prior to Admission medications   Medication Sig Start Date End Date Taking? Authorizing Provider  levothyroxine (SYNTHROID) 75 MCG tablet TAKE 1 TABLET(75 MCG) BY MOUTH DAILY 11/16/21  Yes Reva Bores, MD  nitrofurantoin, macrocrystal-monohydrate, (MACROBID) 100 MG capsule Take 1 capsule (100 mg total) by mouth 2 (two) times daily. 03/25/23  Yes Meleana Commerford K, PA-C  triamcinolone cream (KENALOG) 0.1 % Apply 1 Application topically 2 (two) times daily. 03/25/23  Yes Wynton Hufstetler, Noberto Retort, PA-C    Family History Family History  Problem Relation Age of Onset   Hyperlipidemia Father    Thyroid disease Father    Diabetes  Father    Thyroid disease Brother    Thyroid disease Sister    Hypertension Maternal Grandmother    Diabetes Paternal Grandmother    Hyperlipidemia Paternal Grandmother    Cancer Paternal Grandfather        Prostate    Social History Social History   Tobacco Use   Smoking status: Never    Passive exposure: Never   Smokeless tobacco: Never  Vaping Use   Vaping status: Never Used  Substance Use Topics   Alcohol use: Not Currently    Alcohol/week: 3.0 standard drinks of alcohol    Types: 3 Standard drinks or equivalent per week    Comment: Rare   Drug use: Not Currently    Types: Cocaine    Comment: per pt did Cocaine 2 yrs ago.05-2017     Allergies   Patient has no known  allergies.   Review of Systems Review of Systems  Constitutional:  Positive for activity change. Negative for appetite change, fatigue and fever.  Respiratory:  Negative for cough and shortness of breath.   Cardiovascular:  Negative for chest pain.  Gastrointestinal:  Negative for abdominal pain, diarrhea, nausea and vomiting.  Genitourinary:  Positive for dysuria, frequency and urgency. Negative for genital sores, hematuria, vaginal bleeding, vaginal discharge and vaginal pain.     Physical Exam Triage Vital Signs ED Triage Vitals  Encounter Vitals Group     BP 03/25/23 1419 112/77     Systolic BP Percentile --      Diastolic BP Percentile --      Pulse Rate 03/25/23 1419 75     Resp 03/25/23 1419 16     Temp 03/25/23 1419 98 F (36.7 C)     Temp Source 03/25/23 1419 Oral     SpO2 03/25/23 1419 97 %     Weight --      Height --      Head Circumference --      Peak Flow --      Pain Score 03/25/23 1415 6     Pain Loc --      Pain Education --      Exclude from Growth Chart --    No data found.  Updated Vital Signs BP 112/77 (BP Location: Left Arm)   Pulse 75   Temp 98 F (36.7 C) (Oral)   Resp 16   LMP 02/19/2023   SpO2 97%   Breastfeeding No   Visual Acuity Right Eye Distance:   Left Eye Distance:   Bilateral Distance:    Right Eye Near:   Left Eye Near:    Bilateral Near:     Physical Exam Vitals reviewed.  Constitutional:      General: She is awake. She is not in acute distress.    Appearance: Normal appearance. She is well-developed. She is not ill-appearing.     Comments: Very pleasant female appears stated age in no acute distress sitting comfortably in exam room  HENT:     Head: Normocephalic and atraumatic.  Cardiovascular:     Rate and Rhythm: Normal rate and regular rhythm.     Heart sounds: Normal heart sounds, S1 normal and S2 normal. No murmur heard. Pulmonary:     Effort: Pulmonary effort is normal.     Breath sounds: Normal breath  sounds. No wheezing, rhonchi or rales.     Comments: Clear to auscultation bilaterally Abdominal:     General: Bowel sounds are normal.     Palpations: Abdomen  is soft.     Tenderness: There is no abdominal tenderness. There is no right CVA tenderness, left CVA tenderness, guarding or rebound.     Comments: Benign abdominal exam  Genitourinary:    Comments: Exam deferred Skin:    Findings: Rash present. Rash is macular and papular.     Comments: Small patch of maculopapular rash noted left lateral neck with similar-appearing patch on right upper abdomen inferior to breast tissue.  No bleeding or drainage noted.  No streaking or evidence of lymphangitis.  Psychiatric:        Behavior: Behavior is cooperative.      UC Treatments / Results  Labs (all labs ordered are listed, but only abnormal results are displayed) Labs Reviewed  POCT URINALYSIS DIP (MANUAL ENTRY) - Abnormal; Notable for the following components:      Result Value   Clarity, UA hazy (*)    Leukocytes, UA Small (1+) (*)    All other components within normal limits  URINE CULTURE  CERVICOVAGINAL ANCILLARY ONLY    EKG   Radiology No results found.  Procedures Procedures (including critical care time)  Medications Ordered in UC Medications  cefTRIAXone (ROCEPHIN) injection 1 g (has no administration in time range)    Initial Impression / Assessment and Plan / UC Course  I have reviewed the triage vital signs and the nursing notes.  Pertinent labs & imaging results that were available during my care of the patient were reviewed by me and considered in my medical decision making (see chart for details).     Patient is well-appearing, afebrile, nontoxic, nontachycardic.  Vital signs and physical exam are reassuring with no indication for emergent evaluation or imaging.  UA did have trace leukocytes so we will treat for acute UTI given her symptoms and abnormal urine findings.  Given she has been symptomatic  for over a week she was given 1 g of Rocephin in clinic and will start Macrobid twice daily for 5 days.  Will send this for culture and contact her if we need to stop or change her antibiotics based on culture results.  Given she has had some mild discharge we will obtain cervical vaginal swab to ensure that she does not have a vaginal condition that is contributing to her symptoms.  Will defer treatment until results are available.  She was encouraged to rest and drink plenty of fluid.  Discussed that if her symptoms are not improving or if anything worsens she needs to be seen immediately including high fever, abdominal pain, pelvic pain, nausea, vomiting.  Discussed that rash is consistent with dermatitis that could have been triggered by exposure to poison ivy.  Given very localized patches will treat with topical steroids and triamcinolone was sent to pharmacy.  Discussed that she should keep the area clean to prevent secondary infection.  If her symptoms are not improving or if anything worsens she needs to be seen immediately including spread of rash, fever, nausea, vomiting.  Final Clinical Impressions(s) / UC Diagnoses   Final diagnoses:  Acute UTI  Lower abdominal pain  Vaginal discharge  Dermatitis     Discharge Instructions      We are treating you for urinary tract infection.  We will contact you if we need to change your treatment plan based on your urine culture or swab results.  Please take Macrobid twice daily for 5 days.  We gave you an injection of antibiotics today.  Make sure you rest and drink plenty  of fluid.  If your symptoms are not improving or if anything worsens and you have high fever, worsening abdominal pain, nausea, vomiting you should be seen immediately.  Apply triamcinolone twice daily to the areas of rash.  Keep these areas clean with soap and water.  If anything changes or worsens and you have spread of rash or additional symptoms please return for  reevaluation.     ED Prescriptions     Medication Sig Dispense Auth. Provider   nitrofurantoin, macrocrystal-monohydrate, (MACROBID) 100 MG capsule Take 1 capsule (100 mg total) by mouth 2 (two) times daily. 10 capsule Zettie Gootee K, PA-C   triamcinolone cream (KENALOG) 0.1 % Apply 1 Application topically 2 (two) times daily. 30 g Kellyn Mansfield, Noberto Retort, PA-C      PDMP not reviewed this encounter.   Jeani Hawking, PA-C 03/25/23 1450

## 2023-03-25 NOTE — ED Triage Notes (Signed)
Pt reports dysuria and odor to urine x 1 week. Also has a rash on her neck and under right breast

## 2023-03-27 LAB — URINE CULTURE

## 2023-03-28 ENCOUNTER — Telehealth (HOSPITAL_COMMUNITY): Payer: Self-pay

## 2023-03-28 LAB — CERVICOVAGINAL ANCILLARY ONLY
Bacterial Vaginitis (gardnerella): POSITIVE — AB
Candida Glabrata: NEGATIVE
Candida Vaginitis: NEGATIVE
Chlamydia: NEGATIVE
Comment: NEGATIVE
Comment: NEGATIVE
Comment: NEGATIVE
Comment: NEGATIVE
Comment: NEGATIVE
Comment: NORMAL
Neisseria Gonorrhea: NEGATIVE
Trichomonas: NEGATIVE

## 2023-03-28 MED ORDER — METRONIDAZOLE 500 MG PO TABS: 500.0000 mg | ORAL_TABLET | Freq: Two times a day (BID) | ORAL | 0 refills | Status: AC

## 2023-03-28 NOTE — Telephone Encounter (Addendum)
Per protocol, pt requires tx with metronidazole. Attempted to reach patient x1. VM full.  Rx sent to pharmacy on file.

## 2023-04-27 NOTE — Progress Notes (Unsigned)
Office Visit Note   Patient: Emma Robbins           Date of Birth: Jul 16, 1990           MRN: [id number] Visit Date: 04/28/2023              Requested by: No referring provider defined for this encounter. PCP: Patient, No Pcp Per   Assessment & Plan: Visit Diagnoses:  1. Chronic pain of both shoulders     Plan: Patient is a 33 year old female with bilateral shoulder pain worse on the right.  I do not have a strong suspicion that she has a structural problem with her shoulder.  Seems rheumatologic in nature based on clinical presentation and her description.  Will order arthritis panel.  She would like to try some physical therapy.  Follow-up as needed.  Follow-Up Instructions: No follow-ups on file.   Orders:  Orders Placed This Encounter  Procedures   XR Shoulder Left   XR Shoulder Right   XR Cervical Spine 2 or 3 views   Uric acid   Sedimentation rate   ANA   Rheumatoid Factor   No orders of the defined types were placed in this encounter.     Procedures: No procedures performed   Clinical Data: No additional findings.   Subjective: Chief Complaint  Patient presents with   Right Shoulder - Pain   Left Shoulder - Pain    HPI Emma Robbins is a 33 year old female here for bilateral shoulder pain greater on the right.  Endorses pain posteriorly in the muscle is worse on the right.  Denies any radicular symptoms.  States that she has some neck pain to.  She feels a burning pain.  She has tried massages and acupuncture. Review of Systems  Constitutional: Negative.   HENT: Negative.    Eyes: Negative.   Respiratory: Negative.    Cardiovascular: Negative.   Endocrine: Negative.   Musculoskeletal: Negative.   Neurological: Negative.   Hematological: Negative.   Psychiatric/Behavioral: Negative.    All other systems reviewed and are negative.    Objective: Vital Signs: There were no vitals taken for this visit.  Physical Exam Vitals and nursing note  reviewed.  Constitutional:      Appearance: She is well-developed.  HENT:     Head: Atraumatic.     Nose: Nose normal.  Eyes:     Extraocular Movements: Extraocular movements intact.  Cardiovascular:     Pulses: Normal pulses.  Pulmonary:     Effort: Pulmonary effort is normal.  Abdominal:     Palpations: Abdomen is soft.  Musculoskeletal:     Cervical back: Neck supple.  Skin:    General: Skin is warm.     Capillary Refill: Capillary refill takes less than 2 seconds.  Neurological:     Mental Status: She is alert. Mental status is at baseline.  Psychiatric:        Behavior: Behavior normal.        Thought Content: Thought content normal.        Judgment: Judgment normal.    Ortho Exam Examination of bilateral shoulders show normal range of motion.  She reports diffuse tenderness throughout the right shoulder.  Manual muscle testing shows adequate strength with subjective pain. Specialty Comments:  No specialty comments available.  Imaging: XR Shoulder Left Result Date: 04/28/2023 X-rays of the left shoulder show no acute or structural abnormalities   XR Shoulder Right Result Date: 04/28/2023 3 view xrays show no acute  or structural abnormalities.  Slight degenerative changes of the distal clavicle  XR Cervical Spine 2 or 3 views Result Date: 04/28/2023 X-rays of the cervical spine show no acute or structural abnormalities    PMFS History: Patient Active Problem List   Diagnosis Date Noted   Acquired hypothyroidism 07/14/2009   Past Medical History:  Diagnosis Date   Adjustment disorder with mixed anxiety and depressed mood 08/10/2017   Anxiety    Depression    Gallstone 11/2011   Headache(784.0)    unspecified - 2-3 x/week   Hemorrhoids    Hypothyroidism    Thyroid disease     Family History  Problem Relation Age of Onset   Hyperlipidemia Father    Thyroid disease Father    Diabetes Father    Thyroid disease Brother    Thyroid disease Sister     Hypertension Maternal Grandmother    Diabetes Paternal Grandmother    Hyperlipidemia Paternal Grandmother    Cancer Paternal Grandfather        Prostate    Past Surgical History:  Procedure Laterality Date   BREAST SURGERY  03/29/2010   Fibroadenoma   CESAREAN SECTION N/A 09/03/2021   Procedure: CESAREAN SECTION;  Surgeon: Conan Bowens, MD;  Location: [facility];  Service: Obstetrics;  Laterality: N/A;   CESAREAN SECTION WITH BILATERAL TUBAL LIGATION  09/03/2021   CHOLECYSTECTOMY  12/09/2011   Procedure: LAPAROSCOPIC CHOLECYSTECTOMY;  Surgeon: Currie Paris, MD;  Location: [facility];  Service: General;  Laterality: N/A;   LAPAROSCOPIC OVARIAN CYSTECTOMY  12/16/2010   Right cystadenofibroma   Social History   Occupational History   Not on file  Tobacco Use   Smoking status: Never    Passive exposure: Never   Smokeless tobacco: Never  Vaping Use   Vaping status: Never Used  Substance and Sexual Activity   Alcohol use: Not Currently    Alcohol/week: 3.0 standard drinks of alcohol    Types: 3 Standard drinks or equivalent per week    Comment: Rare   Drug use: Not Currently    Types: Cocaine    Comment: per pt did Cocaine 2 yrs ago.05-2017   Sexual activity: Yes    Birth control/protection: Surgical

## 2023-04-28 ENCOUNTER — Other Ambulatory Visit (INDEPENDENT_AMBULATORY_CARE_PROVIDER_SITE_OTHER): Payer: Medicaid Other

## 2023-04-28 ENCOUNTER — Ambulatory Visit: Payer: Medicaid Other | Admitting: Orthopaedic Surgery

## 2023-04-28 DIAGNOSIS — G8929 Other chronic pain: Secondary | ICD-10-CM

## 2023-04-28 DIAGNOSIS — M25512 Pain in left shoulder: Secondary | ICD-10-CM

## 2023-04-28 DIAGNOSIS — M25511 Pain in right shoulder: Secondary | ICD-10-CM

## 2023-04-29 LAB — RHEUMATOID FACTOR: Rheumatoid fact SerPl-aCnc: <10 [IU]/mL (ref <14)

## 2023-04-29 LAB — ANA: Anti Nuclear Antibody (ANA): NEGATIVE

## 2023-04-29 LAB — URIC ACID: Uric Acid, Serum: 4.4 mg/dL (ref 2.5–7.0)

## 2023-04-29 LAB — SEDIMENTATION RATE: Sed Rate: 14 mm/h (ref 0–20)

## 2023-05-24 ENCOUNTER — Encounter: Payer: Self-pay | Admitting: Orthopaedic Surgery

## 2023-06-05 ENCOUNTER — Encounter (HOSPITAL_COMMUNITY): Payer: Self-pay | Admitting: Emergency Medicine

## 2023-06-05 ENCOUNTER — Emergency Department (HOSPITAL_COMMUNITY)
Admission: EM | Admit: 2023-06-05 | Discharge: 2023-06-06 | Disposition: A | Discharge status: Home / Self Care | Attending: Emergency Medicine | Admitting: Emergency Medicine

## 2023-06-05 ENCOUNTER — Other Ambulatory Visit: Payer: Self-pay

## 2023-06-05 ENCOUNTER — Emergency Department (HOSPITAL_COMMUNITY)

## 2023-06-05 DIAGNOSIS — R1032 Left lower quadrant pain: Secondary | ICD-10-CM | POA: Insufficient documentation

## 2023-06-05 DIAGNOSIS — R109 Unspecified abdominal pain: Secondary | ICD-10-CM | POA: Diagnosis present

## 2023-06-05 DIAGNOSIS — N898 Other specified noninflammatory disorders of vagina: Secondary | ICD-10-CM | POA: Diagnosis not present

## 2023-06-05 DIAGNOSIS — R072 Precordial pain: Secondary | ICD-10-CM | POA: Insufficient documentation

## 2023-06-05 DIAGNOSIS — N760 Acute vaginitis: Secondary | ICD-10-CM

## 2023-06-05 DIAGNOSIS — R102 Pelvic and perineal pain: Secondary | ICD-10-CM

## 2023-06-05 DIAGNOSIS — Z79899 Other long term (current) drug therapy: Secondary | ICD-10-CM | POA: Insufficient documentation

## 2023-06-05 LAB — URINALYSIS, ROUTINE W REFLEX MICROSCOPIC
Bilirubin Urine: NEGATIVE
Glucose, UA: NEGATIVE mg/dL
Ketones, ur: NEGATIVE mg/dL
Nitrite: NEGATIVE
Protein, ur: NEGATIVE mg/dL
Specific Gravity, Urine: 1.031 — ABNORMAL HIGH (ref 1.005–1.030)
pH: 5 (ref 5.0–8.0)

## 2023-06-05 LAB — WET PREP, GENITAL
Clue Cells Wet Prep HPF POC: NONE SEEN
Sperm: NONE SEEN
Trich, Wet Prep: NONE SEEN
WBC, Wet Prep HPF POC: >=10 — AB (ref <10)
Yeast Wet Prep HPF POC: NONE SEEN

## 2023-06-05 NOTE — ED Triage Notes (Signed)
 Pt in with left lower abdominal pain, worsening since yesterday. Radiates to L low back. Reports frequent and malodorous urine, LMP 1wk ago

## 2023-06-05 NOTE — ED Provider Triage Note (Signed)
 Emergency Medicine Provider Triage Evaluation Note  Jarelis Ehlert , a 33 y.o. female  was evaluated in triage.  Pt complains of abdominal pain. Endorses persistent LLQ pain for the past 2 days. Had UTI in January, went to UC was given a shot there and sent a prescription of abx to the pharmacy, which she never picked up.  Review of Systems  Positive: LLQ pain, flank pain Negative: N/V/D  Physical Exam  There were no vitals taken for this visit. Gen:   Awake, no distress   Resp:  Normal effort  MSK:   Moves extremities without difficulty  Other:    Medical Decision Making  Medically screening exam initiated at 7:14 PM.  Appropriate orders placed.  Fernanda Twaddell was informed that the remainder of the evaluation will be completed by another provider, this initial triage assessment does not replace that evaluation, and the importance of remaining in the ED until their evaluation is complete.   Maxwell Marion, PA-C 06/05/23 [phone]

## 2023-06-06 ENCOUNTER — Emergency Department (HOSPITAL_COMMUNITY)

## 2023-06-06 LAB — CBC WITH DIFFERENTIAL/PLATELET
Abs Immature Granulocytes: 0.01 10*3/uL (ref 0.00–0.07)
Basophils Absolute: 0.1 10*3/uL (ref 0.0–0.1)
Basophils Relative: 1 %
Eosinophils Absolute: 0.2 10*3/uL (ref 0.0–0.5)
Eosinophils Relative: 3 %
HCT: 39.4 % (ref 36.0–46.0)
Hemoglobin: 13.7 g/dL (ref 12.0–15.0)
Immature Granulocytes: 0 %
Lymphocytes Relative: 36 %
Lymphs Abs: 2.7 10*3/uL (ref 0.7–4.0)
MCH: 32.3 pg (ref 26.0–34.0)
MCHC: 34.8 g/dL (ref 30.0–36.0)
MCV: 92.9 fL (ref 80.0–100.0)
Monocytes Absolute: 0.5 10*3/uL (ref 0.1–1.0)
Monocytes Relative: 6 %
Neutro Abs: 4.2 10*3/uL (ref 1.7–7.7)
Neutrophils Relative %: 54 %
Platelets: 262 10*3/uL (ref 150–400)
RBC: 4.24 MIL/uL (ref 3.87–5.11)
RDW: 12.5 % (ref 11.5–15.5)
WBC: 7.7 10*3/uL (ref 4.0–10.5)
nRBC: 0 % (ref 0.0–0.2)

## 2023-06-06 LAB — COMPREHENSIVE METABOLIC PANEL
ALT: 48 U/L — ABNORMAL HIGH (ref 0–44)
AST: 31 U/L (ref 15–41)
Albumin: 3.7 g/dL (ref 3.5–5.0)
Alkaline Phosphatase: 66 U/L (ref 38–126)
Anion gap: 8 (ref 5–15)
BUN: 10 mg/dL (ref 6–20)
CO2: 22 mmol/L (ref 22–32)
Calcium: 9.1 mg/dL (ref 8.9–10.3)
Chloride: 109 mmol/L (ref 98–111)
Creatinine, Ser: 0.9 mg/dL (ref 0.44–1.00)
GFR, Estimated: >60 mL/min (ref >60)
Glucose, Bld: 98 mg/dL (ref 70–99)
Potassium: 3.5 mmol/L (ref 3.5–5.1)
Sodium: 139 mmol/L (ref 135–145)
Total Bilirubin: 0.7 mg/dL (ref 0.0–1.2)
Total Protein: 6.8 g/dL (ref 6.5–8.1)

## 2023-06-06 LAB — GC/CHLAMYDIA PROBE AMP (~~LOC~~) NOT AT ARMC
Chlamydia: NEGATIVE
Comment: NEGATIVE
Comment: NORMAL
Neisseria Gonorrhea: NEGATIVE

## 2023-06-06 LAB — PREGNANCY, URINE: Preg Test, Ur: NEGATIVE

## 2023-06-06 MED ORDER — METRONIDAZOLE 500 MG PO TABS: 500.0000 mg | ORAL_TABLET | Freq: Two times a day (BID) | ORAL | 0 refills | Status: AC

## 2023-06-06 MED ORDER — OXYCODONE-ACETAMINOPHEN 5-325 MG PO TABS
2.0000 | ORAL_TABLET | Freq: Once | ORAL | Status: AC
  Administered 2023-06-06: 2 via ORAL
  Filled 2023-06-06: qty 2

## 2023-06-06 MED ORDER — ONDANSETRON 4 MG PO TBDP
8.0000 mg | ORAL_TABLET | Freq: Once | ORAL | Status: AC
  Administered 2023-06-06: 8 mg via ORAL
  Filled 2023-06-06: qty 2

## 2023-06-06 NOTE — Discharge Instructions (Addendum)
 You were evaluated today for lower abdomen and pelvic pain.  On your pelvic swab you have many white blood cells.  This may represent an infection of the vaginal area called bacterial vaginosis.  We will we treated you with metronidazole 500 mg for 7 days. No stone or obstruction was seen on the CT of your abdomen.  There is some constipation noted.  There is a follicle of the right ovary that evaluated with ultrasound and shows no acute abnormality. Please take medications as prescribed Return here if you are having new or worsening symptoms There is a number on your discharge paper to call to help you find a primary care doctor. You were given a [facility] and they can recheck you

## 2023-06-06 NOTE — ED Notes (Signed)
 Pt d/c home per EDP order, discharge summary reviewed, verbalizes understanding. Ambulatory off unit. NAD.

## 2023-06-06 NOTE — ED Provider Notes (Signed)
 [place] EMERGENCY DEPARTMENT AT [facility] Provider Note   CSN: [id number] Arrival date & time: 06/05/23  1837     History  Chief Complaint  Patient presents with  . Abdominal Pain    Emma Robbins is a 33 y.o. female.  HPI 33 year old female presents today complaining of lower abdominal pain.  She has been having this off and on for several months.  She has been treated for a urinary tract infection.  For the past 2 days she reports that she has had some pain in the left lower quadrant and across her mid back.  Pain in the front has been mainly on the left side.  She currently feels somewhat better.  She denies any fever or chills.  She has had some nausea but no vomiting.  She reports regular periods and is sexually active with her husband.  She denies any other abnormal vaginal discharge.  Prior to my evaluation urinalysis completed that shows rare bacteria, 0-5 red blood cells, 0-5 white blood cells, 0-5 squamous epithelial cells.  Wet prep performed showed no yeast trichomoniasis or clue cells greater than 10 white blood cells.  Patient states she was seen at urgent care for these problems previously and has continued to have discomfort.  She is having some frequency of urination but is not currently having pain with this.    Home Medications Prior to Admission medications   Medication Sig Start Date End Date Taking? Authorizing Provider  levothyroxine (SYNTHROID) 75 MCG tablet TAKE 1 TABLET(75 MCG) BY MOUTH DAILY 11/16/21   Reva Bores, MD  nitrofurantoin, macrocrystal-monohydrate, (MACROBID) 100 MG capsule Take 1 capsule (100 mg total) by mouth 2 (two) times daily. 03/25/23   Raspet, Noberto Retort, PA-C  triamcinolone cream (KENALOG) 0.1 % Apply 1 Application topically 2 (two) times daily. 03/25/23   Raspet, Noberto Retort, PA-C      Allergies    Patient has no known allergies.    Review of Systems   Review of Systems  Physical Exam Updated Vital Signs BP 114/83 (BP  Location: Left Arm)   Pulse 71   Temp 97.6 F (36.4 C)   Resp 16   Wt 105.3 kg   SpO2 100%   Breastfeeding No   BMI 39.85 kg/m  Physical Exam Vitals reviewed.  HENT:     Head: Normocephalic.     Mouth/Throat:     Mouth: Mucous membranes are moist.  Eyes:     Extraocular Movements: Extraocular movements intact.  Cardiovascular:     Rate and Rhythm: Normal rate and regular rhythm.  Pulmonary:     Effort: Pulmonary effort is normal.  Abdominal:     General: Abdomen is flat. Bowel sounds are normal.     Palpations: Abdomen is soft.     Tenderness: There is no abdominal tenderness.  Genitourinary:    Vagina: Vaginal discharge and tenderness present.     Cervix: Normal.     Uterus: Tender.      Adnexa: Right adnexa normal.       Left: Tenderness present.      Comments: Tenderness to the left side of midline of her uterus Skin:    General: Skin is warm.  Neurological:     General: No focal deficit present.     Mental Status: She is alert.  Psychiatric:        Mood and Affect: Mood normal.     ED Results / Procedures / Treatments   Labs (all labs  ordered are listed, but only abnormal results are displayed) Labs Reviewed  WET PREP, GENITAL - Abnormal; Notable for the following components:      Result Value   WBC, Wet Prep HPF POC >=10 (*)    All other components within normal limits  URINALYSIS, ROUTINE W REFLEX MICROSCOPIC - Abnormal; Notable for the following components:   Specific Gravity, Urine 1.031 (*)    Hgb urine dipstick SMALL (*)    Leukocytes,Ua SMALL (*)    Bacteria, UA RARE (*)    All other components within normal limits  PREGNANCY, URINE  CBC WITH DIFFERENTIAL/PLATELET  COMPREHENSIVE METABOLIC PANEL  GC/CHLAMYDIA PROBE AMP ([place]) NOT AT [facility]    EKG None  Radiology CT Renal Stone Study Result Date: 06/05/2023 CLINICAL DATA:  Abdominal/flank pain.  Stone suspected. EXAM: CT ABDOMEN AND PELVIS WITHOUT CONTRAST TECHNIQUE: Multidetector CT  imaging of the abdomen and pelvis was performed following the standard protocol without IV contrast. RADIATION DOSE REDUCTION: This exam was performed according to the departmental dose-optimization program which includes automated exposure control, adjustment of the mA and/or kV according to patient size and/or use of iterative reconstruction technique. COMPARISON:  None. FINDINGS: Lower chest: Linear scarring or atelectasis noted lingular base. Lung bases are otherwise clear. The cardiac size is normal. Hepatobiliary: No focal liver abnormality is seen without contrast. The liver is 19 cm in length. Status post cholecystectomy. No biliary dilatation. Pancreas: No masses seen without contrast.  No ductal dilatation. Spleen: Unremarkable without contrast.  No splenomegaly. Adrenals/Urinary Tract: There is no adrenal mass. There is no contour deforming abnormality of the unenhanced kidneys. No urinary stone or obstruction is seen. The bladder is contracted and not well seen but there are no inflammatory changes around it. Stomach/Bowel: Unremarkable gastric wall. Normal caliber small intestine without inflammatory change. Normal subcecal appendix. There is mild fecal stasis.  Otherwise unremarkable large intestine. Vascular/Lymphatic: No significant vascular findings are present. No enlarged abdominal or pelvic lymph nodes. Reproductive: Uterus and bilateral adnexa are unremarkable apart from a dominant 2.4 cm follicle of the right ovary, Hounsfield density is 20. No follow-up imaging is recommended unless there are localizing symptoms. Pelvic ultrasound may be considered if there are localizing symptoms. Other: No abdominal wall hernia or abnormality. No abdominopelvic ascites. Musculoskeletal: There is sclerosis symmetrically at the SI joints consistent with nonerosive chronic sacroiliitis. No acute or other significant osseous findings. IMPRESSION: 1. No urinary stone or obstruction is seen. 2. Constipation. 3.  Dominant 2.4 cm follicle of the right ovary. If there are localizing symptoms, pelvic ultrasound may be helpful for further study. No follow-up imaging recommended if there are no localizing symptoms. 4. Chronic nonerosive sacroiliitis. Electronically Signed   By: Almira Bar M.D.   On: 06/05/2023 20:10    Procedures Procedures    Medications Ordered in ED Medications - No data to display  ED Course/ Medical Decision Making/ A&P Clinical Course as of 06/06/23 1648  Mon Jun 06, 2023  1052 Pregnancy test is negative complete metabolic panel except for mildly elevated ALT at 48 otherwise within normal limits [DR]    Clinical Course User Index [DR] Margarita Grizzle, MD                                 Medical Decision Making Amount and/or Complexity of Data Reviewed Labs: ordered. Radiology: ordered.  Risk Prescription drug management.   33 year old female presents today complaining of  left lower abdominal/pelvic pain.  Patient has evaluated prior to my evaluation with urinalysis and CT.  CT showed no stone or obstruction, constipation, dominant 2.4 follicle in the right ovary. Pelvic ultrasound was ordered Pelvic exam done with some discharge and tenderness to the left side of her uterus and possibly over the left ovary. Awaiting pregnancy test and results of pelvic ultrasound         Final Clinical Impression(s) / ED Diagnoses Final diagnoses:  None    Rx / DC Orders ED Discharge Orders     None         Margarita Grizzle, MD 06/06/23 [phone]

## 2023-08-16 ENCOUNTER — Ambulatory Visit: Admitting: Family

## 2023-08-16 ENCOUNTER — Encounter: Payer: Self-pay | Admitting: Family

## 2023-08-16 VITALS — BP 114/78 | HR 75 | Ht 62.0 in | Wt 221.8 lb

## 2023-08-16 DIAGNOSIS — F418 Other specified anxiety disorders: Secondary | ICD-10-CM | POA: Diagnosis not present

## 2023-08-16 DIAGNOSIS — Z7689 Persons encountering health services in other specified circumstances: Secondary | ICD-10-CM

## 2023-08-16 DIAGNOSIS — Z8639 Personal history of other endocrine, nutritional and metabolic disease: Secondary | ICD-10-CM

## 2023-08-16 DIAGNOSIS — M792 Neuralgia and neuritis, unspecified: Secondary | ICD-10-CM

## 2023-08-16 DIAGNOSIS — F32A Depression, unspecified: Secondary | ICD-10-CM

## 2023-08-16 DIAGNOSIS — L237 Allergic contact dermatitis due to plants, except food: Secondary | ICD-10-CM

## 2023-08-16 DIAGNOSIS — J984 Other disorders of lung: Secondary | ICD-10-CM

## 2023-08-16 MED ORDER — TRIAMCINOLONE ACETONIDE 0.1 % EX CREA: 1.0000 | TOPICAL_CREAM | Freq: Two times a day (BID) | CUTANEOUS | 2 refills | Status: DC

## 2023-08-16 MED ORDER — HYDROXYZINE PAMOATE 25 MG PO CAPS: 25.0000 mg | ORAL_CAPSULE | Freq: Three times a day (TID) | ORAL | 2 refills | Status: AC | PRN

## 2023-08-16 MED ORDER — ESCITALOPRAM OXALATE 5 MG PO TABS: 5.0000 mg | ORAL_TABLET | Freq: Every day | ORAL | 0 refills | Status: DC

## 2023-08-16 NOTE — Progress Notes (Signed)
 Subjective:    Emma Robbins - 33 y.o. female MRN [id number]  Date of birth: [birth year]  HPI  Emma Robbins is to establish care.   Current issues and/or concerns: - Anxiety depression. States sometimes she has panic attacks. States this is not new but in the past she was not ready to try medications. On today she states she is ready to begin anxiety depression medication. States she isn't exactly sure what the primary cause of her anxiety depression is. States she thinks may be due to she "isn't doing anything" due to taking care of her two children in the home. States she is planning to get a new job soon. States sometimes she feels like she will hurt her children when her anxiety depression is elevated. States her husband helps her during those times. She would like a referral to a therapist. She denies thoughts of self-harm, suicidal ideations, homicidal ideations. - Hypothyroidism. States history of hypothyroidism but hasn't taken thyroid medication in over 1 year.  - Poison ivy of bilateral upper extremities. Denies red flag symptoms. She is using over-the-counter medication to help with minimal relief. - States "bone pain". Denies recent trauma/injury and red flag symptoms. States she was seen by Orthopedics and was told there is nothing they could do to help. States Orthopedics told her that her pain is related to nerve issues. - States she was told that she has scarring of the lungs. She denies red flag symptoms.  - No further issues/concerns for discussion today.  ROS per HPI     Health Maintenance:  Health Maintenance Due  Topic Date Due   DTaP/Tdap/Td (1 - Tdap) Never done   COVID-19 Vaccine (1 - 2024-25 season) Never done     Past Medical History: Patient Active Problem List   Diagnosis Date Noted   Acquired hypothyroidism 07/14/2009      Social History   reports that she has never smoked. She has never been exposed to tobacco smoke. She has never used smokeless  tobacco. She reports that she does not currently use alcohol after a past usage of about 3.0 standard drinks of alcohol per week. She reports that she does not currently use drugs after having used the following drugs: Cocaine.   Family History  family history includes Cancer in her paternal grandfather; Diabetes in her father and paternal grandmother; Hyperlipidemia in her father and paternal grandmother; Hypertension in her maternal grandmother; Thyroid disease in her brother, father, and sister.   Medications: reviewed and updated   Objective:   Physical Exam BP 114/78 (BP Location: Right Arm, Patient Position: Sitting, Cuff Size: Large)   Pulse 75   Ht 5\' 2"  (1.575 m)   Wt 221 lb 12.8 oz (100.6 kg)   SpO2 95%   Breastfeeding No   BMI 40.57 kg/m   Physical Exam HENT:     Head: Normocephalic and atraumatic.     Nose: Nose normal.     Mouth/Throat:     Mouth: Mucous membranes are moist.     Pharynx: Oropharynx is clear.  Eyes:     Extraocular Movements: Extraocular movements intact.     Conjunctiva/sclera: Conjunctivae normal.     Pupils: Pupils are equal, round, and reactive to light.  Cardiovascular:     Rate and Rhythm: Normal rate and regular rhythm.     Pulses: Normal pulses.     Heart sounds: Normal heart sounds.  Pulmonary:     Effort: Pulmonary effort is normal.     Breath  sounds: Normal breath sounds.  Musculoskeletal:        General: Normal range of motion.     Cervical back: Normal range of motion and neck supple.  Skin:    General: Skin is warm and dry.     Comments: Poison ivy bilateral upper extremities.  Neurological:     General: No focal deficit present.     Mental Status: She is alert and oriented to person, place, and time.  Psychiatric:        Mood and Affect: Mood normal.        Behavior: Behavior normal.      Assessment & Plan:  1. Encounter to establish care (Primary) - Patient presents today to establish care. During the interim follow-up  with primary provider as scheduled.  - Return for annual physical examination, labs, and health maintenance. Arrive fasting meaning having no food for at least 8 hours prior to appointment. You may have only water  or black coffee. Please take scheduled medications as normal.  2. Anxiety and depression - Patient denies thoughts of self-harm, suicidal ideations, homicidal ideations. - Trial Escitalopram and Hydroxyzine  as prescribed. Counseled on medication adherence/adverse effects.  - Referral to Psychiatry for evaluation/management. - Follow-up with primary provider as scheduled. - escitalopram (LEXAPRO) 5 MG tablet; Take 1 tablet (5 mg total) by mouth daily.  Dispense: 90 tablet; Refill: 0 - hydrOXYzine  (VISTARIL ) 25 MG capsule; Take 1 capsule (25 mg total) by mouth every 8 (eight) hours as needed.  Dispense: 30 capsule; Refill: 2 - Ambulatory referral to Psychiatry  3. History of hypothyroidism - Routine screening.  - TSH  4. Poison ivy - Triamcinolone  cream as prescribed. Counseled on medication adherence/adverse effects.  - Follow-up with primary provider as scheduled. - triamcinolone  cream (KENALOG ) 0.1 %; Apply 1 Application topically 2 (two) times daily.  Dispense: 60 g; Refill: 2  5. Nerve pain - Referral to Neurology for evaluation/management. - Ambulatory referral to Neurology  6. Scarring of lung - Referral to Pulmonology for evaluation/management. - Ambulatory referral to Pulmonology    Patient was given clear instructions to go to Emergency Department or return to medical center if symptoms don't improve, worsen, or new problems develop.The patient verbalized understanding.  I discussed the assessment and treatment plan with the patient. The patient was provided an opportunity to ask questions and all were answered. The patient agreed with the plan and demonstrated an understanding of the instructions.   The patient was advised to call back or seek an in-person  evaluation if the symptoms worsen or if the condition fails to improve as anticipated.    Lavona Pounds, NP 08/16/2023, 1:47 PM Primary Care at [facility]

## 2023-08-17 ENCOUNTER — Ambulatory Visit: Payer: Self-pay | Admitting: Family

## 2023-08-17 LAB — TSH: TSH: 2.69 u[IU]/mL (ref 0.450–4.500)

## 2023-09-29 ENCOUNTER — Encounter: Admitting: Family

## 2023-10-19 ENCOUNTER — Encounter: Payer: Self-pay | Admitting: Family

## 2023-10-19 ENCOUNTER — Ambulatory Visit: Payer: Self-pay

## 2023-10-19 ENCOUNTER — Ambulatory Visit (INDEPENDENT_AMBULATORY_CARE_PROVIDER_SITE_OTHER): Admitting: Family

## 2023-10-19 VITALS — BP 114/79 | HR 79 | Temp 97.8°F | Resp 16 | Ht 64.0 in | Wt 215.8 lb

## 2023-10-19 DIAGNOSIS — B379 Candidiasis, unspecified: Secondary | ICD-10-CM

## 2023-10-19 MED ORDER — NYSTATIN 100000 UNIT/GM EX OINT: 1.0000 | TOPICAL_OINTMENT | Freq: Two times a day (BID) | CUTANEOUS | 1 refills | Status: DC

## 2023-10-19 NOTE — Telephone Encounter (Signed)
 FYI Only or Action Required?: Action required by provider: request for appointment.  Patient was last seen in primary care on 08/16/2023 by Lorren Greig PARAS, NP.  Called Nurse Triage reporting GI Problem.  Symptoms began yesterday.  Interventions attempted: Nothing.  Symptoms are: unchanged.  Triage Disposition: See Physician Within 24 Hours  Patient/caregiver understands and will follow disposition?: YesCopied from CRM [id number]. Topic: Clinical - Red Word Triage >> Oct 19, 2023  8:44 AM Treva T wrote: Red Word that prompted transfer to Nurse Triage: Patient calling reporting she woke up this morning with pain and discomfort in naval area, redness, with swelling, and yellow/brownish colored discharge. Reason for Disposition  [1] MILD pain (e.g., does not interfere with normal activities) AND [2] comes and goes (cramps) AND [3] present > 72 hours  (Exception: This same abdominal pain is a chronic symptom recurrent or ongoing AND present > 4 weeks.)  Answer Assessment - Initial Assessment Questions 1. LOCATION: Where does it hurt?      Around [employer]  2. RADIATION: Does the pain shoot anywhere else? (e.g., chest, back)     denies 3. ONSET: When did the pain begin? (e.g., minutes, hours or days ago)      yesterday 4. SUDDEN: Gradual or sudden onset?     sudden 5. PATTERN Does the pain come and go, or is it constant?     Comes and goes 6. SEVERITY: How bad is the pain?  (e.g., Scale 1-10; mild, moderate, or severe)     2 7. RECURRENT SYMPTOM: Have you ever had this type of stomach pain before? If Yes, ask: When was the last time? and What happened that time?      denies 8. AGGRAVATING FACTORS: Does anything seem to cause this pain? (e.g., foods, stress, alcohol)     Not sure ER SYMPTOMS: Do you have any other symptoms? (e.g., back pain, diarrhea, fever, urination pain, vomiting)       denies   Discomfort started yesterday but is worse this morning . Belly button  is red and swollen. Pt noticed a yellow/brownish discharge coming from belly button this morning. Drainage does have smell. No known injury.  Protocols used: Abdominal Pain - Upper-A-AH

## 2023-10-19 NOTE — Telephone Encounter (Signed)
 Patient was seen today.

## 2023-10-19 NOTE — Progress Notes (Signed)
 Naval discharge and pain, swelling, Patient scored a 7 on the GAD-7

## 2023-10-19 NOTE — Progress Notes (Signed)
 Patient ID: Emma Robbins, female    DOB: 1990/06/27  MRN: [id number]  CC: Navel Pain  Subjective: Emma Robbins is a 33 y.o. female who presents for navel pain.  Her concerns today include:  - States navel pain with discharge began yesterday. Denies red flag symptoms. States no known cause. - Anxiety depression. States she only takes anxiety depression medications as needed. She confirms she does not need medication refills. States appointment scheduled with counselor in a few months. She denies thoughts of self-harm, suicidal ideations, homicidal ideations.  Patient Active Problem List   Diagnosis Date Noted   Acquired hypothyroidism 07/14/2009     Current Outpatient Medications on File Prior to Visit  Medication Sig Dispense Refill   escitalopram  (LEXAPRO ) 5 MG tablet Take 1 tablet (5 mg total) by mouth daily. (Patient not taking: Reported on 10/19/2023) 90 tablet 0   hydrOXYzine  (VISTARIL ) 25 MG capsule Take 1 capsule (25 mg total) by mouth every 8 (eight) hours as needed. (Patient not taking: Reported on 10/19/2023) 30 capsule 2   levothyroxine  (SYNTHROID ) 75 MCG tablet TAKE 1 TABLET(75 MCG) BY MOUTH DAILY (Patient not taking: Reported on 10/19/2023) 30 tablet 0   metroNIDAZOLE  (FLAGYL ) 500 MG tablet Take 1 tablet (500 mg total) by mouth 2 (two) times daily. (Patient not taking: Reported on 08/16/2023) 14 tablet 0   nitrofurantoin , macrocrystal-monohydrate, (MACROBID ) 100 MG capsule Take 1 capsule (100 mg total) by mouth 2 (two) times daily. (Patient not taking: Reported on 08/16/2023) 10 capsule 0   triamcinolone  cream (KENALOG ) 0.1 % Apply 1 Application topically 2 (two) times daily. (Patient not taking: Reported on 10/19/2023) 60 g 2   No current facility-administered medications on file prior to visit.    No Known Allergies  Social History   Socioeconomic History   Marital status: Married    Spouse name: Not on file   Number of children: Not on file   Years of education:  Not on file   Highest education level: Not on file  Occupational History   Not on file  Tobacco Use   Smoking status: Never    Passive exposure: Never   Smokeless tobacco: Never  Vaping Use   Vaping status: Never Used  Substance and Sexual Activity   Alcohol use: Not Currently    Alcohol/week: 3.0 standard drinks of alcohol    Types: 3 Standard drinks or equivalent per week    Comment: Rare   Drug use: Not Currently    Types: Cocaine    Comment: per pt did Cocaine 2 yrs ago.05-2017   Sexual activity: Yes    Birth control/protection: Surgical  Other Topics Concern   Not on file  Social History Narrative   Not on file   Social Drivers of Health   Financial Resource Strain: Low Risk  (10/19/2023)   Overall Financial Resource Strain (CARDIA)    Difficulty of Paying Living Expenses: Not hard at all  Food Insecurity: No Food Insecurity (10/19/2023)   Hunger Vital Sign    Worried About Running Out of Food in the Last Year: Never true    Ran Out of Food in the Last Year: Never true  Transportation Needs: No Transportation Needs (10/19/2023)   PRAPARE - Administrator, Civil Service (Medical): No    Lack of Transportation (Non-Medical): No  Physical Activity: Insufficiently Active (10/19/2023)   Exercise Vital Sign    Days of Exercise per Week: 3 days    Minutes of Exercise per Session:  30 min  Stress: No Stress Concern Present (10/19/2023)   [employer] of Occupational Health - Occupational Stress Questionnaire    Feeling of Stress: Only a little  Social Connections: Socially Integrated (10/19/2023)   Social Connection and Isolation Panel    Frequency of Communication with Friends and Family: More than three times a week    Frequency of Social Gatherings with Friends and Family: Twice a week    Attends Religious Services: More than 4 times per year    Active Member of Clubs or Organizations: Yes    Attends Banker Meetings: More than 4 times per year     Marital Status: Married  Catering manager Violence: Not At Risk (10/19/2023)   Humiliation, Afraid, Rape, and Kick questionnaire    Fear of Current or Ex-Partner: No    Emotionally Abused: No    Physically Abused: No    Sexually Abused: No    Family History  Problem Relation Age of Onset   Hyperlipidemia Father    Thyroid disease Father    Diabetes Father    Thyroid disease Brother    Thyroid disease Sister    Hypertension Maternal Grandmother    Diabetes Paternal Grandmother    Hyperlipidemia Paternal Grandmother    Cancer Paternal Grandfather        Prostate    Past Surgical History:  Procedure Laterality Date   BREAST SURGERY  03/29/2010   Fibroadenoma   CESAREAN SECTION N/A 09/03/2021   Procedure: CESAREAN SECTION;  Surgeon: Nicholaus Burnard HERO, MD;  Location: [facility];  Service: Obstetrics;  Laterality: N/A;   CESAREAN SECTION WITH BILATERAL TUBAL LIGATION  09/03/2021   CHOLECYSTECTOMY  12/09/2011   Procedure: LAPAROSCOPIC CHOLECYSTECTOMY;  Surgeon: Sherlean JINNY Laughter, MD;  Location: [facility];  Service: General;  Laterality: N/A;   LAPAROSCOPIC OVARIAN CYSTECTOMY  12/16/2010   Right cystadenofibroma    ROS: Review of Systems Negative except as stated above  PHYSICAL EXAM: BP 114/79   Pulse 79   Temp 97.8 F (36.6 C) (Oral)   Resp 16   Ht 5' 4 (1.626 m)   Wt 215 lb 12.8 oz (97.9 kg)   LMP 10/09/2023 (Exact Date)   SpO2 96%   BMI 37.04 kg/m   Physical Exam HENT:     Head: Normocephalic and atraumatic.     Nose: Nose normal.     Mouth/Throat:     Mouth: Mucous membranes are moist.     Pharynx: Oropharynx is clear.  Eyes:     Extraocular Movements: Extraocular movements intact.     Conjunctiva/sclera: Conjunctivae normal.     Pupils: Pupils are equal, round, and reactive to light.  Cardiovascular:     Rate and Rhythm: Normal rate and regular rhythm.     Pulses: Normal pulses.     Heart sounds: Normal heart sounds.  Pulmonary:      Effort: Pulmonary effort is normal.     Breath sounds: Normal breath sounds.  Abdominal:     Comments: Umbilical area mild erythema, no drainage.  Musculoskeletal:        General: Normal range of motion.     Cervical back: Normal range of motion and neck supple.  Neurological:     General: No focal deficit present.     Mental Status: She is alert and oriented to person, place, and time.  Psychiatric:        Mood and Affect: Mood normal.  Behavior: Behavior normal.    ASSESSMENT AND PLAN: 1. Candidiasis (Primary) - Nystatin  Ointment as prescribed. Counseled on medication adherence/adverse effects.  - Follow-up with primary provider as scheduled. - nystatin  ointment (MYCOSTATIN ); Apply 1 Application topically 2 (two) times daily.  Dispense: 60 g; Refill: 1   Patient was given the opportunity to ask questions.  Patient verbalized understanding of the plan and was able to repeat key elements of the plan. Patient was given clear instructions to go to Emergency Department or return to medical center if symptoms don't improve, worsen, or new problems develop.The patient verbalized understanding.   Requested Prescriptions   Signed Prescriptions Disp Refills   nystatin  ointment (MYCOSTATIN ) 60 g 1    Sig: Apply 1 Application topically 2 (two) times daily.    Follow-up with primary provider as scheduled.  Greig JINNY Drones, NP

## 2023-11-01 ENCOUNTER — Ambulatory Visit (HOSPITAL_COMMUNITY): Admitting: Clinical

## 2023-11-11 ENCOUNTER — Other Ambulatory Visit: Payer: Self-pay | Admitting: Family

## 2023-11-11 DIAGNOSIS — F32A Depression, unspecified: Secondary | ICD-10-CM

## 2023-11-15 NOTE — Telephone Encounter (Signed)
 Complete

## 2023-12-09 ENCOUNTER — Encounter: Admitting: Family

## 2023-12-09 ENCOUNTER — Ambulatory Visit: Admitting: Pulmonary Disease

## 2023-12-09 ENCOUNTER — Encounter: Payer: Self-pay | Admitting: Pulmonary Disease

## 2023-12-09 NOTE — Progress Notes (Signed)
 Erroneous encounter-disregard

## 2023-12-13 ENCOUNTER — Ambulatory Visit (INDEPENDENT_AMBULATORY_CARE_PROVIDER_SITE_OTHER): Admitting: Obstetrics and Gynecology

## 2023-12-13 ENCOUNTER — Other Ambulatory Visit (HOSPITAL_COMMUNITY)
Admission: RE | Admit: 2023-12-13 | Discharge: 2023-12-13 | Disposition: A | Discharge status: Home / Self Care | Source: Ambulatory Visit | Attending: Obstetrics and Gynecology | Admitting: Obstetrics and Gynecology

## 2023-12-13 ENCOUNTER — Encounter: Payer: Self-pay | Admitting: Obstetrics and Gynecology

## 2023-12-13 VITALS — BP 120/79 | HR 76 | Ht 64.0 in | Wt 224.0 lb

## 2023-12-13 DIAGNOSIS — N949 Unspecified condition associated with female genital organs and menstrual cycle: Secondary | ICD-10-CM | POA: Diagnosis not present

## 2023-12-13 DIAGNOSIS — R102 Pelvic and perineal pain: Secondary | ICD-10-CM | POA: Insufficient documentation

## 2023-12-13 DIAGNOSIS — N941 Unspecified dyspareunia: Secondary | ICD-10-CM | POA: Diagnosis not present

## 2023-12-13 DIAGNOSIS — R1030 Lower abdominal pain, unspecified: Secondary | ICD-10-CM

## 2023-12-13 LAB — POCT URINALYSIS DIPSTICK
Bilirubin, UA: NEGATIVE
Blood, UA: NEGATIVE
Glucose, UA: NEGATIVE
Ketones, UA: NEGATIVE
Leukocytes, UA: NEGATIVE
Nitrite, UA: NEGATIVE
Protein, UA: POSITIVE — AB
Spec Grav, UA: 1.01 (ref 1.010–1.025)
Urobilinogen, UA: NEGATIVE U/dL — AB
pH, UA: 6 (ref 5.0–8.0)

## 2023-12-13 MED ORDER — CEFTRIAXONE SODIUM 500 MG IJ SOLR
500.0000 mg | Freq: Once | INTRAMUSCULAR | Status: AC
  Administered 2023-12-13: 500 mg via INTRAMUSCULAR

## 2023-12-13 MED ORDER — METRONIDAZOLE 500 MG PO TABS: 500.0000 mg | ORAL_TABLET | Freq: Two times a day (BID) | ORAL | 0 refills | Status: AC

## 2023-12-13 MED ORDER — DOXYCYCLINE HYCLATE 100 MG PO CAPS: 100.0000 mg | ORAL_CAPSULE | Freq: Two times a day (BID) | ORAL | 0 refills | Status: AC

## 2023-12-13 NOTE — Progress Notes (Signed)
   GYNECOLOGY PROGRESS NOTE  History:  33 y.o. [id number] presents to [facility] for pelvic pain and pain with intercourse. She reports intermittnet sharp pain near her ovaries. She reports she will experience pain during intercourse and sometimes the pain will linger for 3-4 hours. She reports it is causing her so much discomfort she does not want to engage in intercourse. She endorses she will have some bleeding after intercourse. She does have a history of ovarian cysts. She had a bilateral salpingectomy 2023. Monogamous with one partner, husband.   The following portions of the patient's history were reviewed and updated as appropriate: allergies, current medications, past family history, past medical history, past social history, past surgical history and problem list. Last pap smear on 07/03/21 was normal, neg HRHPV.  Health Maintenance Due  Topic Date Due   Influenza Vaccine  Never done   COVID-19 Vaccine (1 - 2024-25 season) Never done     Review of Systems:  Pertinent items are noted in HPI.   Objective:  Physical Exam Blood pressure 120/79, pulse 76, height 5' 4 (1.626 m), weight 224 lb (101.6 kg), last menstrual period 10/09/2023. VS reviewed, nursing note reviewed,  Constitutional: well developed, well nourished, no distress HEENT: normocephalic Pulm/chest wall: normal effort Breast Exam: deferred Abdomen: soft, midline lower abd tender to palpate Neuro: alert and oriented  Skin: warm, dry Psych: affect normal Pelvic exam: Cervix pink, visually closed, without lesion, scant white creamy discharge, vaginal walls and external genitalia normal Bimanual exam: CMT noted as well as mild uterine tenderness uterus nonenlarged, adnexa without tenderness, enlargement, or mass  Assessment & Plan:  1. Pelvic pain (Primary) Plan pelvic ultrasound  - Cervicovaginal ancillary only( [place]) - Urine Culture - POCT Urinalysis Dipstick - US  PELVIC COMPLETE WITH TRANSVAGINAL;  Future  2. Dyspareunia in female 3. Cervical motion tenderness 4. Lower abdominal pain Given CMT and abdominal pain discussed empiric treatment for PID  - doxycycline  (VIBRAMYCIN ) 100 MG capsule; Take 1 capsule (100 mg total) by mouth 2 (two) times daily for 14 days.  Dispense: 28 capsule; Refill: 0 - metroNIDAZOLE  (FLAGYL ) 500 MG tablet; Take 1 tablet (500 mg total) by mouth 2 (two) times daily for 14 days.  Dispense: 28 tablet; Refill: 0 - cefTRIAXone  (ROCEPHIN ) injection 500 mg    Hallelujah Wysong, FNP

## 2023-12-13 NOTE — Progress Notes (Signed)
 C/O pain and bleeding with intercourse. Pain in what she feels in ovaries. Started 3-4 mos ago. Pain sharp on both sides of pelvis. Stopped intercourse and no pain noted. Strong odor with yellow discharge and some blood mixed in. Last pap 06/2022 wnl.

## 2023-12-15 ENCOUNTER — Ambulatory Visit: Payer: Self-pay | Admitting: Obstetrics and Gynecology

## 2023-12-15 LAB — CERVICOVAGINAL ANCILLARY ONLY
Bacterial Vaginitis (gardnerella): POSITIVE — AB
Candida Glabrata: NEGATIVE
Candida Vaginitis: NEGATIVE
Chlamydia: NEGATIVE
Comment: NEGATIVE
Comment: NEGATIVE
Comment: NEGATIVE
Comment: NEGATIVE
Comment: NEGATIVE
Comment: NORMAL
Neisseria Gonorrhea: NEGATIVE
Trichomonas: NEGATIVE

## 2023-12-15 LAB — URINE CULTURE

## 2023-12-21 ENCOUNTER — Ambulatory Visit (HOSPITAL_BASED_OUTPATIENT_CLINIC_OR_DEPARTMENT_OTHER)
Admission: RE | Admit: 2023-12-21 | Discharge: 2023-12-21 | Disposition: A | Discharge status: Home / Self Care | Source: Ambulatory Visit | Attending: Obstetrics and Gynecology | Admitting: Obstetrics and Gynecology

## 2023-12-21 DIAGNOSIS — R102 Pelvic and perineal pain: Secondary | ICD-10-CM | POA: Insufficient documentation

## 2024-01-01 ENCOUNTER — Encounter: Payer: Self-pay | Admitting: Emergency Medicine

## 2024-01-01 ENCOUNTER — Ambulatory Visit
Admission: EM | Admit: 2024-01-01 | Discharge: 2024-01-01 | Disposition: A | Discharge status: Home / Self Care | Attending: Nurse Practitioner | Admitting: Nurse Practitioner

## 2024-01-01 DIAGNOSIS — B370 Candidal stomatitis: Secondary | ICD-10-CM | POA: Diagnosis not present

## 2024-01-01 DIAGNOSIS — R300 Dysuria: Secondary | ICD-10-CM | POA: Diagnosis not present

## 2024-01-01 DIAGNOSIS — N3001 Acute cystitis with hematuria: Secondary | ICD-10-CM | POA: Diagnosis not present

## 2024-01-01 DIAGNOSIS — N898 Other specified noninflammatory disorders of vagina: Secondary | ICD-10-CM | POA: Diagnosis present

## 2024-01-01 LAB — POCT URINE DIPSTICK
Bilirubin, UA: NEGATIVE
Glucose, UA: NEGATIVE mg/dL
Ketones, POC UA: NEGATIVE mg/dL
Nitrite, UA: NEGATIVE
Spec Grav, UA: 1.02 (ref 1.010–1.025)
Urobilinogen, UA: 0.2 U/dL
pH, UA: 6 (ref 5.0–8.0)

## 2024-01-01 LAB — POCT URINE PREGNANCY: Preg Test, Ur: NEGATIVE

## 2024-01-01 MED ORDER — PHENAZOPYRIDINE HCL 200 MG PO TABS: 200.0000 mg | ORAL_TABLET | ORAL | 0 refills | Status: AC

## 2024-01-01 MED ORDER — NITROFURANTOIN MONOHYD MACRO 100 MG PO CAPS: 100.0000 mg | ORAL_CAPSULE | Freq: Two times a day (BID) | ORAL | 0 refills | Status: AC

## 2024-01-01 MED ORDER — NYSTATIN 100000 UNIT/ML MT SUSP: 500000.0000 [IU] | Freq: Four times a day (QID) | OROMUCOSAL | 0 refills | Status: AC

## 2024-01-01 MED ORDER — FLUCONAZOLE 150 MG PO TABS: 150.0000 mg | ORAL_TABLET | ORAL | 0 refills | Status: AC

## 2024-01-01 NOTE — ED Provider Notes (Signed)
 [facility]    CSN: [id number] Arrival date & time: 01/01/24  1442      History   Chief Complaint Chief Complaint  Patient presents with   Thrush    HPI Emma Robbins is a 33 y.o. female.   Discussed the use of AI scribe software for clinical note transcription with the patient, who gave verbal consent to proceed.   The patient presents with multiple complaints, including vaginal itching with discharge, urinary symptoms, oral discomfort, and sore throat, ongoing for approximately one week. She describes vaginal itching associated with a white discharge and noticeable odor, along with burning and pain during urination and increased urinary frequency. She notes redness and swelling developing in the genital area.  She also reports oral discomfort, describing a sore throat, white spots on her tongue and in her mouth, and mild dental discomfort. Swallowing saliva is painful.  Her last menstrual period was approximately two weeks ago. She is sexually active with one female partner (her husband) in the past three months and is not currently using birth control.  She was recently evaluated by her gynecologist, where a vaginal swab was collected. At that time, she was empirically treated with a Rocephin  injection and doxycycline  for presumed gonorrhea and chlamydia, and Flagyl  was prescribed for possible vaginal infection. The vaginal swab returned positive only for bacterial vaginosis.  The following sections of the patient's history were reviewed and updated as appropriate: allergies, current medications, past family history, past medical history, past social history, past surgical history, and problem list.       Past Medical History:  Diagnosis Date   Adjustment disorder with mixed anxiety and depressed mood 08/10/2017   Anxiety    Depression    Gallstone 11/2011   Headache(784.0)    unspecified - 2-3 x/week   Hemorrhoids    Hypothyroidism    Thyroid disease      Patient Active Problem List   Diagnosis Date Noted   Acquired hypothyroidism 07/14/2009    Past Surgical History:  Procedure Laterality Date   BREAST SURGERY  03/29/2010   Fibroadenoma   CESAREAN SECTION N/A 09/03/2021   Procedure: CESAREAN SECTION;  Surgeon: Nicholaus Burnard HERO, MD;  Location: [facility];  Service: Obstetrics;  Laterality: N/A;   CESAREAN SECTION WITH BILATERAL TUBAL LIGATION  09/03/2021   CHOLECYSTECTOMY  12/09/2011   Procedure: LAPAROSCOPIC CHOLECYSTECTOMY;  Surgeon: Sherlean JINNY Laughter, MD;  Location: [facility];  Service: General;  Laterality: N/A;   LAPAROSCOPIC OVARIAN CYSTECTOMY  12/16/2010   Right cystadenofibroma    OB History     Gravida  3   Para  2   Term  2   Preterm      AB  1   Living  2      SAB  1   IAB      Ectopic      Multiple  0   Live Births  2            Home Medications    Prior to Admission medications   Medication Sig Start Date End Date Taking? Authorizing Provider  metroNIDAZOLE  (FLAGYL ) 500 MG tablet Take 1 tablet (500 mg total) by mouth 2 (two) times daily. 06/06/23  Yes Levander Houston, MD  nitrofurantoin , macrocrystal-monohydrate, (MACROBID ) 100 MG capsule Take 1 capsule (100 mg total) by mouth 2 (two) times daily. 01/01/24  Yes Guy Seese, FNP  phenazopyridine (PYRIDIUM) 200 MG tablet Take 1 tablet (200 mg total) by mouth  3 (three) times daily at 8am, 3pm and bedtime for 2 days. 01/01/24 01/03/24 Yes Laelynn Blizzard, Lucie, FNP  escitalopram  (LEXAPRO ) 5 MG tablet TAKE 1 TABLET (5 MG TOTAL) BY MOUTH DAILY. Patient not taking: Reported on 12/13/2023 11/15/23   Lorren Greig PARAS, NP  fluconazole  (DIFLUCAN ) 150 MG tablet Take 1 tablet (150 mg total) by mouth every 3 (three) days for 2 doses. 01/01/24 01/05/24 Yes Debroh Sieloff, FNP  hydrOXYzine  (VISTARIL ) 25 MG capsule Take 1 capsule (25 mg total) by mouth every 8 (eight) hours as needed. Patient not taking: Reported on 10/19/2023 08/16/23   Lorren Greig PARAS, NP  ibuprofen  (ADVIL ) 200 MG tablet Take 2 tablets every day by oral route as needed.    [provider]  levothyroxine  (SYNTHROID ) 75 MCG tablet TAKE 1 TABLET(75 MCG) BY MOUTH DAILY Patient not taking: Reported on 10/19/2023 11/16/21   Fredirick Glenys RAMAN, MD  nystatin  (MYCOSTATIN ) 100000 UNIT/ML suspension Use as directed 5 mLs (500,000 Units total) in the mouth or throat 4 (four) times daily for 7 days. Swish around the mouth then swallow. Do not rinse mouth afterwards. Do not eat or drink for at least 30 minutes after use. 01/01/24 01/08/24 Yes Iola Lucie, FNP    Family History Family History  Problem Relation Age of Onset   Hyperlipidemia Father    Thyroid disease Father    Diabetes Father    Thyroid disease Brother    Thyroid disease Sister    Hypertension Maternal Grandmother    Diabetes Paternal Grandmother    Hyperlipidemia Paternal Grandmother    Cancer Paternal Grandfather        Prostate    Social History Social History   Tobacco Use   Smoking status: Never    Passive exposure: Never   Smokeless tobacco: Never  Vaping Use   Vaping status: Never Used  Substance Use Topics   Alcohol use: Not Currently    Alcohol/week: 3.0 standard drinks of alcohol    Types: 3 Standard drinks or equivalent per week    Comment: Rare   Drug use: Not Currently    Types: Cocaine    Comment: per pt did Cocaine 2 yrs ago.05-2017     Allergies   Patient has no known allergies.   Review of Systems Review of Systems  HENT:  Positive for sore throat. Negative for dental problem (left lower) and mouth sores.   Genitourinary:  Positive for dysuria, frequency and vaginal discharge. Negative for genital sores and menstrual problem (LMP about 2 weeks ago).       Vaginal irritation, itching and odor.   All other systems reviewed and are negative.    Physical Exam Triage Vital Signs ED Triage Vitals  Encounter Vitals Group     BP 01/01/24 1525 127/79     Girls  Systolic BP Percentile --      Girls Diastolic BP Percentile --      Boys Systolic BP Percentile --      Boys Diastolic BP Percentile --      Pulse Rate 01/01/24 1525 88     Resp 01/01/24 1525 18     Temp 01/01/24 1525 98.1 F (36.7 C)     Temp Source 01/01/24 1525 Oral     SpO2 01/01/24 1525 97 %     Weight 01/01/24 1524 223 lb 15.8 oz (101.6 kg)     Height --      Head Circumference --      Peak Flow --  Pain Score 01/01/24 1522 6     Pain Loc --      Pain Education --      Exclude from Growth Chart --    No data found.  Updated Vital Signs BP 127/79 (BP Location: Left Arm)   Pulse 88   Temp 98.1 F (36.7 C) (Oral)   Resp 18   Wt 223 lb 15.8 oz (101.6 kg)   LMP 12/28/2023 (Approximate)   SpO2 97%   BMI 38.45 kg/m   Visual Acuity Right Eye Distance:   Left Eye Distance:   Bilateral Distance:    Right Eye Near:   Left Eye Near:    Bilateral Near:     Physical Exam Vitals reviewed.  Constitutional:      General: She is awake. She is not in acute distress.    Appearance: Normal appearance. She is well-developed. She is not ill-appearing, toxic-appearing or diaphoretic.  HENT:     Head: Normocephalic.     Right Ear: Hearing normal.     Left Ear: Hearing normal.     Nose: Nose normal.     Mouth/Throat:     Lips: Pink. No lesions.     Mouth: Mucous membranes are moist. No oral lesions.     Dentition: Normal dentition. No dental tenderness, gingival swelling, dental caries, dental abscesses or gum lesions.     Palate: No lesions.     Pharynx: Oropharynx is clear. Uvula midline.     Comments: Oral examination reveals a white coating on the posterior aspect of the tongue. No lesions are noted on the gums, throat, or palate. Eyes:     General: Vision grossly intact.     Conjunctiva/sclera: Conjunctivae normal.  Cardiovascular:     Rate and Rhythm: Normal rate and regular rhythm.     Heart sounds: Normal heart sounds.  Pulmonary:     Effort: Pulmonary  effort is normal.     Breath sounds: Normal breath sounds and air entry.  Abdominal:     Palpations: Abdomen is soft.  Genitourinary:    Comments: Deferred; patient performed self-swab for Aptima testing  Musculoskeletal:        General: Normal range of motion.     Cervical back: Normal range of motion and neck supple.  Skin:    General: Skin is warm and dry.  Neurological:     General: No focal deficit present.     Mental Status: She is alert and oriented to person, place, and time.  Psychiatric:        Mood and Affect: Mood normal.        Speech: Speech normal.        Behavior: Behavior normal. Behavior is cooperative.      UC Treatments / Results  Labs (all labs ordered are listed, but only abnormal results are displayed) Labs Reviewed  POCT URINE DIPSTICK - Abnormal; Notable for the following components:      Result Value   Clarity, UA cloudy (*)    Blood, UA trace-intact (*)    Leukocytes, UA Large (3+) (*)    All other components within normal limits  POCT URINE PREGNANCY - Normal  URINE CULTURE  CERVICOVAGINAL ANCILLARY ONLY    EKG   Radiology No results found.  Procedures Procedures (including critical care time)  Medications Ordered in UC Medications - No data to display  Initial Impression / Assessment and Plan / UC Course  I have reviewed the triage vital signs and the nursing  notes.  Pertinent labs & imaging results that were available during my care of the patient were reviewed by me and considered in my medical decision making (see chart for details).     The patient presents with one week of vaginal, urinary, and oral complaints. Urine pregnancy test was negative. Urinalysis revealed cloudy urine with trace RBCs, large leukocytes, and negative nitrites, concerning for a urinary tract infection; urine culture has been sent and results are pending. She reports dysuria, urinary frequency, and vaginal irritation with discharge and odor. A  cervicovaginal swab was obtained via self-swab and is pending.  Oral examination demonstrated a white coating on the posterior aspect of the tongue without lesions to the gums, throat, or palate, consistent with oral candidiasis. Given prior empiric treatment with doxycycline  and Rocephin  for presumed GC/CT and subsequent negative results, doxycycline  was discontinued. She will continue Flagyl  for bacterial vaginosis. Antifungal therapy was initiated with Diflucan  (fluconazole ) x 2 doses as well as Nystatin  swish and swallow.  For urinary symptoms, empiric treatment with Macrobid  and Pyridium was prescribed, with adjustments to be made if urine culture results indicate resistant organisms. Supportive care, medication adherence, and return precautions for worsening symptoms were reviewed.  Today's evaluation has revealed no signs of a dangerous process. Discussed diagnosis with patient and/or guardian. Patient and/or guardian aware of their diagnosis, possible red flag symptoms to watch out for and need for close follow up. Patient and/or guardian understands verbal and written discharge instructions. Patient and/or guardian comfortable with plan and disposition.  Patient and/or guardian has a clear mental status at this time, good insight into illness (after discussion and teaching) and has clear judgment to make decisions regarding their care  Documentation was completed with the aid of voice recognition software. Transcription may contain typographical errors.  Final Clinical Impressions(s) / UC Diagnoses   Final diagnoses:  Vaginal discharge  Oral pharyngeal candidiasis  Dysuria  Acute cystitis with hematuria     Discharge Instructions      You were seen today for vaginal, urinary, and mouth symptoms. Your pregnancy test was negative. Your urine test showed signs of infection, and we sent a urine culture to confirm which bacteria is causing the infection. Results will be available in the  next few days. For your vaginal symptoms, you may stop taking doxycycline  since your tests for chlamydia and gonorrhea were negative. Please continue taking Flagyl  as prescribed for bacterial vaginosis. Because you have yeast symptoms, you were prescribed Diflucan  (fluconazole ), to be taken as two doses a few days apart, and Nystatin  liquid to swish and swallow to treat yeast in your mouth. For your urinary infection, you were prescribed Macrobid  (nitrofurantoin ) to take for several days, along with Pyridium to help relieve burning and discomfort with urination. Pyridium may turn your urine bright orange; this is normal and not harmful. Please take all medications exactly as prescribed. Drink plenty of water  to help flush your urinary tract and avoid alcohol while taking Flagyl . Follow up with your provider once test results are back, or sooner if you are not improving. Return for medical care right away if you develop fever, severe abdominal pain, worsening burning or discharge, flank pain, or if your symptoms suddenly get worse.     ED Prescriptions     Medication Sig Dispense Auth. Provider   nystatin  (MYCOSTATIN ) 100000 UNIT/ML suspension Use as directed 5 mLs (500,000 Units total) in the mouth or throat 4 (four) times daily for 7 days. Swish around the mouth then  swallow. Do not rinse mouth afterwards. Do not eat or drink for at least 30 minutes after use. 140 mL Iola Lukes, FNP   fluconazole  (DIFLUCAN ) 150 MG tablet Take 1 tablet (150 mg total) by mouth every 3 (three) days for 2 doses. 2 tablet Iola Lukes, FNP   nitrofurantoin , macrocrystal-monohydrate, (MACROBID ) 100 MG capsule Take 1 capsule (100 mg total) by mouth 2 (two) times daily. 10 capsule Iola Lukes, FNP   phenazopyridine (PYRIDIUM) 200 MG tablet Take 1 tablet (200 mg total) by mouth 3 (three) times daily at 8am, 3pm and bedtime for 2 days. 6 tablet Iola Lukes, FNP      PDMP not reviewed this encounter.    Iola Lukes, [place] 01/01/24 [phone]

## 2024-01-01 NOTE — Discharge Instructions (Addendum)
 You were seen today for vaginal, urinary, and mouth symptoms. Your pregnancy test was negative. Your urine test showed signs of infection, and we sent a urine culture to confirm which bacteria is causing the infection. Results will be available in the next few days. For your vaginal symptoms, you may stop taking doxycycline  since your tests for chlamydia and gonorrhea were negative. Please continue taking Flagyl  as prescribed for bacterial vaginosis. Because you have yeast symptoms, you were prescribed Diflucan  (fluconazole ), to be taken as two doses a few days apart, and Nystatin  liquid to swish and swallow to treat yeast in your mouth. For your urinary infection, you were prescribed Macrobid  (nitrofurantoin ) to take for several days, along with Pyridium to help relieve burning and discomfort with urination. Pyridium may turn your urine bright orange; this is normal and not harmful. Please take all medications exactly as prescribed. Drink plenty of water  to help flush your urinary tract and avoid alcohol while taking Flagyl . Follow up with your provider once test results are back, or sooner if you are not improving. Return for medical care right away if you develop fever, severe abdominal pain, worsening burning or discharge, flank pain, or if your symptoms suddenly get worse.

## 2024-01-01 NOTE — ED Triage Notes (Signed)
 Pt presents c/o thrush in mouth and genital area x 7 days that is severely itchy. Pt reports she has been prone to this happening in the past with her genital area but not in her throat. Pt says she isn't sure if the meds she is taking contributes to the sxs.

## 2024-01-02 ENCOUNTER — Ambulatory Visit (INDEPENDENT_AMBULATORY_CARE_PROVIDER_SITE_OTHER): Admitting: Mental Health

## 2024-01-02 ENCOUNTER — Ambulatory Visit (HOSPITAL_COMMUNITY): Payer: Self-pay

## 2024-01-02 DIAGNOSIS — F321 Major depressive disorder, single episode, moderate: Secondary | ICD-10-CM

## 2024-01-02 DIAGNOSIS — F411 Generalized anxiety disorder: Secondary | ICD-10-CM

## 2024-01-02 LAB — CERVICOVAGINAL ANCILLARY ONLY
Bacterial Vaginitis (gardnerella): NEGATIVE
Candida Glabrata: NEGATIVE
Candida Vaginitis: POSITIVE — AB
Chlamydia: NEGATIVE
Comment: NEGATIVE
Comment: NEGATIVE
Comment: NEGATIVE
Comment: NEGATIVE
Comment: NEGATIVE
Comment: NORMAL
Neisseria Gonorrhea: NEGATIVE
Trichomonas: NEGATIVE

## 2024-01-02 LAB — URINE CULTURE

## 2024-01-04 DIAGNOSIS — F321 Major depressive disorder, single episode, moderate: Secondary | ICD-10-CM | POA: Insufficient documentation

## 2024-01-04 DIAGNOSIS — F411 Generalized anxiety disorder: Secondary | ICD-10-CM | POA: Insufficient documentation
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
## 2024-01-11 ENCOUNTER — Ambulatory Visit: Admitting: Obstetrics and Gynecology

## 2024-01-16 ENCOUNTER — Ambulatory Visit: Admitting: Obstetrics and Gynecology

## 2024-01-19 ENCOUNTER — Ambulatory Visit (INDEPENDENT_AMBULATORY_CARE_PROVIDER_SITE_OTHER): Admitting: Internal Medicine

## 2024-01-19 ENCOUNTER — Encounter: Payer: Self-pay | Admitting: Internal Medicine

## 2024-01-19 VITALS — BP 132/68 | HR 77 | Temp 98.4°F | Ht 64.0 in | Wt 226.4 lb

## 2024-01-19 DIAGNOSIS — J40 Bronchitis, not specified as acute or chronic: Secondary | ICD-10-CM | POA: Diagnosis not present

## 2024-01-19 DIAGNOSIS — R0609 Other forms of dyspnea: Secondary | ICD-10-CM

## 2024-01-19 DIAGNOSIS — Z77120 Contact with and (suspected) exposure to mold (toxic): Secondary | ICD-10-CM

## 2024-01-19 DIAGNOSIS — Z7729 Contact with and (suspected ) exposure to other hazardous substances: Secondary | ICD-10-CM | POA: Diagnosis not present

## 2024-01-19 LAB — CBC WITH DIFFERENTIAL/PLATELET
Basophils Absolute: 0 K/uL (ref 0.0–0.1)
Basophils Relative: 0.5 % (ref 0.0–3.0)
Eosinophils Absolute: 0.1 K/uL (ref 0.0–0.7)
Eosinophils Relative: 1.8 % (ref 0.0–5.0)
HCT: 41.7 % (ref 36.0–46.0)
Hemoglobin: 14.1 g/dL (ref 12.0–15.0)
Lymphocytes Relative: 39 % (ref 12.0–46.0)
Lymphs Abs: 2.5 K/uL (ref 0.7–4.0)
MCHC: 33.9 g/dL (ref 30.0–36.0)
MCV: 94.4 fl (ref 78.0–100.0)
Monocytes Absolute: 0.3 K/uL (ref 0.1–1.0)
Monocytes Relative: 5.3 % (ref 3.0–12.0)
Neutro Abs: 3.5 K/uL (ref 1.4–7.7)
Neutrophils Relative %: 53.4 % (ref 43.0–77.0)
Platelets: 277 K/uL (ref 150.0–400.0)
RBC: 4.42 Mil/uL (ref 3.87–5.11)
RDW: 13.3 % (ref 11.5–15.5)
WBC: 6.5 K/uL (ref 4.0–10.5)

## 2024-01-19 LAB — NITRIC OXIDE: Nitric Oxide: 7

## 2024-01-19 NOTE — Progress Notes (Signed)
 OV 01/19/2024  Subjective:  Patient ID: Emma Robbins, female , DOB: April 12, 1990 , age 33 y.o. , MRN: [id number] , ADDRESS: [address] [place] [address] PCP Lorren Greig PARAS, NP Patient Care Team: Lorren Greig PARAS, NP as PCP - General (Nurse Practitioner)  This Provider for this visit: Treatment Team:  Attending Provider: Geronimo Amel, MD    01/19/2024 -   Chief Complaint  Patient presents with   Consult    Pt stated she was in ER earlier in the year and was told her chest and lungs was scarred SOB occurs w/ exertion, Pt stated in fall and winter pt gets real bad bronchitis. Pt stated she has to take deep breaths to be able to breathe       HPI Emma Robbins 33 y.o. -new consult evaluation.  Referred by the ER.  Emma Robbins is a 33 year old female who presents with recurrent respiratory infections and concerns about lung scarring.  Earlier this year, she was seen in the emergency room at [facility] in [place], where she was told that an MRI showed scarring in her chest and lungs.  However review of the chart indicates she has only had MRI of the pelvis and that was many years ago.  The best I could find was CT scan renal study done in March 2025 where the lower bases of the lung was captured and reported of atelectasis was present.    Since contracting COVID-19 in 2021, she has experienced increased susceptibility to respiratory illnesses, particularly during the winter months.  She experiences frequent episodes of respiratory illness, approximately five to six times each winter. Symptoms during these episodes include severe colds, dyspnea, back pain, chest heaviness, dysphonia, sore throat, and chest burning. These symptoms are exacerbated by cold weather and often require treatment with antibiotics and steroids, which provide temporary relief.  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 She has two pet birds, which she has had for over a year, and she cleans their cage herself. She is concerned about potential mold in her home, particularly in her daughter's closet, which she suspects may be contributing to her family's health issues.  The frequency of respiratory infections have been present for 2 years.  All her children get sick.  Her medication history includes a recent course of Macrobid  for a urinary infection, which she completed.  She did get a chest x-ray last year that was clear.   PFT      No data to display             LAB RESULTS last 96 hours No results found.       has a past medical history of Adjustment disorder with mixed anxiety and depressed mood (08/10/2017), Anxiety, Depression, Gallstone (11/2011), Headache(784.0), Hemorrhoids, Hypothyroidism, and Thyroid disease.   reports that she has never smoked. She has never been exposed to tobacco smoke. She has never used smokeless tobacco.  Past Surgical History:  Procedure Laterality Date   BREAST SURGERY  03/29/2010   Fibroadenoma   CESAREAN SECTION N/A 09/03/2021   Procedure: CESAREAN SECTION;  Surgeon: Nicholaus Burnard HERO, MD;  Location: [facility];  Service: Obstetrics;  Laterality: N/A;   CESAREAN SECTION WITH BILATERAL TUBAL LIGATION  09/03/2021   CHOLECYSTECTOMY  12/09/2011   Procedure: LAPAROSCOPIC CHOLECYSTECTOMY;  Surgeon: Sherlean PARAS Laughter, MD;  Location: [facility];  Service: General;  Laterality: N/A;   LAPAROSCOPIC OVARIAN CYSTECTOMY  12/16/2010   Right cystadenofibroma    No Known Allergies  There is no immunization history for the selected administration types on file for this patient.  Family History  Problem Relation Age of Onset   Hyperlipidemia Father    Thyroid disease Father    Diabetes Father    Thyroid disease Brother    Thyroid disease Sister    Hypertension Maternal Grandmother    Diabetes Paternal Grandmother     Hyperlipidemia Paternal Grandmother    Cancer Paternal Grandfather        Prostate     Current Outpatient Medications:    ibuprofen  (ADVIL ) 200 MG tablet, Take 2 tablets every day by oral route as needed., Disp: , Rfl:    nitrofurantoin , macrocrystal-monohydrate, (MACROBID ) 100 MG capsule, Take 1 capsule (100 mg total) by mouth 2 (two) times daily., Disp: 10 capsule, Rfl: 0   escitalopram  (LEXAPRO ) 5 MG tablet, TAKE 1 TABLET (5 MG TOTAL) BY MOUTH DAILY. (Patient not taking: Reported on 01/19/2024), Disp: 90 tablet, Rfl: 0   hydrOXYzine  (VISTARIL ) 25 MG capsule, Take 1 capsule (25 mg total) by mouth every 8 (eight) hours as needed. (Patient not taking: Reported on 01/19/2024), Disp: 30 capsule, Rfl: 2   levothyroxine  (SYNTHROID ) 75 MCG tablet, TAKE 1 TABLET(75 MCG) BY MOUTH DAILY (Patient not taking: Reported on 01/19/2024), Disp: 30 tablet, Rfl: 0   metroNIDAZOLE  (FLAGYL ) 500 MG tablet, Take 1 tablet (500 mg total) by mouth 2 (two) times daily. (Patient not taking: Reported on 01/19/2024), Disp: 14 tablet, Rfl: 0      Objective:   Vitals:   01/19/24 1357  BP: 132/68  Pulse: 77  Temp: 98.4 F (36.9 C)  TempSrc: Oral  SpO2: 97%  Weight: 226 lb 6.4 oz (102.7 kg)  Height: 5' 4 (1.626 m)    Estimated body mass index is 38.86 kg/m as calculated from the following:   Height as of this encounter: 5' 4 (1.626 m).   Weight as of this encounter: 226 lb 6.4 oz (102.7 kg).  @WEIGHTCHANGE @  [employer]   01/19/24 1357  Weight: 226 lb 6.4 oz (102.7 kg)     Physical Exam   General: No distress. Looks well O2 at rest: no Cane present: no Sitting in wheel chair: no Frail: no Obese: yes Neuro: Alert and Oriented x 3. GCS 15. Speech normal Psych: Pleasant Resp:  Barrel Chest - no.  Wheeze - no, Crackles - no, No overt respiratory distress CVS: Normal heart sounds. Murmurs - no Ext: Stigmata of Connective Tissue Disease - no HEENT: Normal upper airway. PEERL +. No post nasal  drip        Assessment/     Assessment & Plan Frequent episodes of bronchitis  DOE (dyspnea on exertion)  Long-term exposure involving bird droppings  Mold exposure    PLAN Patient Instructions     ICD-10-CM   1. Frequent episodes of bronchitis  J40     2. DOE (dyspnea on exertion)  R06.09     3. Long-term exposure involving bird droppings  Z77.29     4. Mold exposure  Z77.120      I am really concerned that the exposure to birds in the house and also the mold is causing a problem called hypersensitivity pneumonitis.  Even if it is not there you are at high risk of getting these problems.  The other possibility is here of asthma  In any event we need  to do investigations to rule all this out and so we can protect your lung health  Plan - Please work with your family and rehome the birds away from your house -Please work with your family and ensure there is no mold or mildew anywhere in the house - Do CBC with differential, RAST allergy panel and an blood hypersensitive pneumonitis panel -Do full pulmonary function test - Do high-resolution CT chest  Follow-up - 4 weeks with nurse practitioner but after completing all of the above    FOLLOWUP    Return for - 4 weeks with nurse practitioner but after completing all of the above.    SIGNATURE    Dr. Dorethia Cave, M.D., F.C.C.P,  Pulmonary and Critical Care Medicine Staff Physician, [facility] Center Director - Interstitial Lung Disease  Program  Pulmonary Fibrosis [facility] Network at [facility] [place], [place], [address]  Pager: [phone], If no answer or between  15:00h - 7:00h: call [phone] Telephone: [phone]  3:00 PM 01/19/2024

## 2024-01-19 NOTE — Patient Instructions (Addendum)
 ICD-10-CM   1. Frequent episodes of bronchitis  J40     2. DOE (dyspnea on exertion)  R06.09     3. Long-term exposure involving bird droppings  Z77.29     4. Mold exposure  Z77.120      I am really concerned that the exposure to birds in the house and also the mold is causing a problem called hypersensitivity pneumonitis.  Even if it is not there you are at high risk of getting these problems.  The other possibility is here of asthma  In any event we need to do investigations to rule all this out and so we can protect your lung health  Plan - Please work with your family and rehome the birds away from your house -Please work with your family and ensure there is no mold or mildew anywhere in the house - Do CBC with differential, RAST allergy panel and an blood hypersensitive pneumonitis panel -Do full pulmonary function test - Do high-resolution CT chest  Follow-up - 4 weeks with nurse practitioner but after completing all of the above

## 2024-01-21 LAB — ALLERGEN PROFILE, PERENNIAL ALLERGEN IGE

## 2024-01-27 ENCOUNTER — Ambulatory Visit (HOSPITAL_BASED_OUTPATIENT_CLINIC_OR_DEPARTMENT_OTHER)
Admission: RE | Admit: 2024-01-27 | Discharge: 2024-01-27 | Disposition: A | Discharge status: Home / Self Care | Source: Ambulatory Visit | Attending: Internal Medicine | Admitting: Internal Medicine

## 2024-01-27 DIAGNOSIS — Z77120 Contact with and (suspected) exposure to mold (toxic): Secondary | ICD-10-CM | POA: Diagnosis present

## 2024-01-27 DIAGNOSIS — R0609 Other forms of dyspnea: Secondary | ICD-10-CM | POA: Diagnosis present

## 2024-01-27 DIAGNOSIS — Z7729 Contact with and (suspected ) exposure to other hazardous substances: Secondary | ICD-10-CM | POA: Diagnosis present

## 2024-01-27 DIAGNOSIS — J40 Bronchitis, not specified as acute or chronic: Secondary | ICD-10-CM | POA: Diagnosis present

## 2024-01-28 LAB — HYPERSENSITIVITY PNUEMONITIS PROFILE
ASPERGILLUS FUMIGATUS: NEGATIVE
Faenia retivirgula: NEGATIVE
Pigeon Serum: NEGATIVE
S. VIRIDIS: NEGATIVE
T. CANDIDUS: NEGATIVE
T. VULGARIS: NEGATIVE

## 2024-01-30 ENCOUNTER — Encounter: Payer: Self-pay | Admitting: Radiology

## 2024-02-07 ENCOUNTER — Ambulatory Visit: Payer: Self-pay | Admitting: Internal Medicine

## 2024-02-07 NOTE — Progress Notes (Signed)
  IMPRESSION: 1. No evidence of fibrotic interstitial lung disease. 2. No evidence of air trapping on expiratory phase imaging; note however that expiratory phases submitted for review are performed at near full inspiration and accordingly limited. 3. No acute findings in the chest.     Electronically Signed   By: Marolyn JONETTA Jaksch M.D.   On: 01/29/2024 16:31

## 2024-02-16 ENCOUNTER — Ambulatory Visit (HOSPITAL_COMMUNITY): Admitting: Mental Health

## 2024-02-16 ENCOUNTER — Encounter (HOSPITAL_COMMUNITY): Payer: Self-pay

## 2024-02-21 ENCOUNTER — Ambulatory Visit (INDEPENDENT_AMBULATORY_CARE_PROVIDER_SITE_OTHER)

## 2024-02-21 ENCOUNTER — Encounter: Payer: Self-pay | Admitting: Primary Care

## 2024-02-21 ENCOUNTER — Ambulatory Visit: Admitting: Primary Care

## 2024-02-21 DIAGNOSIS — Z7729 Contact with and (suspected ) exposure to other hazardous substances: Secondary | ICD-10-CM

## 2024-02-21 DIAGNOSIS — J40 Bronchitis, not specified as acute or chronic: Secondary | ICD-10-CM

## 2024-02-21 DIAGNOSIS — Z77120 Contact with and (suspected) exposure to mold (toxic): Secondary | ICD-10-CM

## 2024-02-21 DIAGNOSIS — R0609 Other forms of dyspnea: Secondary | ICD-10-CM

## 2024-02-21 LAB — PULMONARY FUNCTION TEST
DL/VA % pred: 145 %
DL/VA: 6.6 ml/min/mmHg/L
DLCO cor % pred: 131 %
DLCO cor: 29.42 ml/min/mmHg
DLCO unc % pred: 134 %
DLCO unc: 30.04 ml/min/mmHg
FEF 25-75 Post: 4.45 L/s
FEF 25-75 Pre: 3.84 L/s
FEF2575-%Change-Post: 16 %
FEF2575-%Pred-Post: 132 %
FEF2575-%Pred-Pre: 114 %
FEV1-%Change-Post: 4 %
FEV1-%Pred-Post: 104 %
FEV1-%Pred-Pre: 100 %
FEV1-Post: 3.28 L
FEV1-Pre: 3.14 L
FEV1FVC-%Change-Post: 3 %
FEV1FVC-%Pred-Pre: 103 %
FEV6-%Change-Post: 1 %
FEV6-%Pred-Post: 98 %
FEV6-%Pred-Pre: 97 %
FEV6-Post: 3.67 L
FEV6-Pre: 3.63 L
FEV6FVC-%Pred-Post: 101 %
FEV6FVC-%Pred-Pre: 101 %
FVC-%Change-Post: 1 %
FVC-%Pred-Post: 97 %
FVC-%Pred-Pre: 96 %
FVC-Post: 3.67 L
FVC-Pre: 3.63 L
Post FEV1/FVC ratio: 90 %
Post FEV6/FVC ratio: 100 %
Pre FEV1/FVC ratio: 87 %
Pre FEV6/FVC Ratio: 100 %
RV % pred: 109 %
RV: 1.58 L
TLC % pred: 104 %
TLC: 5.29 L

## 2024-02-21 NOTE — Progress Notes (Signed)
 Full PFT performed today.

## 2024-02-21 NOTE — Patient Instructions (Signed)
 Full PFT performed today.

## 2024-02-21 NOTE — Progress Notes (Deleted)
 @Patient  ID: Emma Robbins, female    DOB: 04-Sep-1990, 33 y.o.   MRN: [id number]  No chief complaint on file.   Referring provider: Jaycee Greig PARAS, NP  HPI:    Previous [facility] pulmonary encounter:  OV 01/19/2024  Chief Complaint  Patient presents with   Consult    Pt stated she was in ER earlier in the year and was told her chest and lungs was scarred SOB occurs w/ exertion, Pt stated in fall and winter pt gets real bad bronchitis. Pt stated she has to take deep breaths to be able to breathe     Emma Robbins 33 y.o. -new consult evaluation.  Referred by the ER.  Emma Robbins is a 33 year old female who presents with recurrent respiratory infections and concerns about lung scarring.  Earlier this year, she was seen in the emergency room at [facility] in [place], where she was told that an MRI showed scarring in her chest and lungs.  However review of the chart indicates she has only had MRI of the pelvis and that was many years ago.  The best I could find was CT scan renal study done in March 2025 where the lower bases of the lung was captured and reported of atelectasis was present.    Since contracting COVID-19 in 2021, she has experienced increased susceptibility to respiratory illnesses, particularly during the winter months.  She experiences frequent episodes of respiratory illness, approximately five to six times each winter. Symptoms during these episodes include severe colds, dyspnea, back pain, chest heaviness, dysphonia, sore throat, and chest burning. These symptoms are exacerbated by cold weather and often require treatment with antibiotics and steroids, which provide temporary relief.  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 She has two pet birds, which she has had for over a year, and she cleans their cage herself. She is concerned about potential mold in her home, particularly in her  daughter's closet, which she suspects may be contributing to her family's health issues.  The frequency of respiratory infections have been present for 2 years.  All her children get sick.  Her medication history includes a recent course of Macrobid  for a urinary infection, which she completed.  She did get a chest x-ray last year that was clear.    02/21/2024- Interim hx Discussed the use of AI scribe software for clinical note transcription with the patient, who gave verbal consent to proceed.  History of Present Illness   Normal breathing test FVC 3.67 (97%), FEV1 3.28 (104%), ratio 90 No obstructive defect or reversibility post BD administration. Normal diffusion capacity.   Hypersensitivity panel normal, Allergry panel normal, CBC and eosinophil normal   IMPRESSION: 1. No evidence of fibrotic interstitial lung disease. 2. No evidence of air trapping on expiratory phase imaging; note however that expiratory phases submitted for review are performed at near full inspiration and accordingly limited. 3. No acute findings in the chest.     No Known Allergies  There is no immunization history for the selected administration types on file for this patient.  Past Medical History:  Diagnosis Date   Adjustment disorder with mixed anxiety and depressed mood 08/10/2017   Anxiety    Depression    Gallstone 11/2011   Headache(784.0)    unspecified - 2-3 x/week   Hemorrhoids    Hypothyroidism    Thyroid disease     Tobacco History: Social History  Tobacco Use  Smoking Status Never   Passive exposure: Never  Smokeless Tobacco Never   Counseling given: Not Answered   Outpatient Medications Prior to Visit  Medication Sig Dispense Refill   escitalopram  (LEXAPRO ) 5 MG tablet TAKE 1 TABLET (5 MG TOTAL) BY MOUTH DAILY. (Patient not taking: Reported on 01/19/2024) 90 tablet 0   hydrOXYzine  (VISTARIL ) 25 MG capsule Take 1 capsule (25 mg total) by mouth every 8 (eight) hours  as needed. (Patient not taking: Reported on 01/19/2024) 30 capsule 2   ibuprofen  (ADVIL ) 200 MG tablet Take 2 tablets every day by oral route as needed.     levothyroxine  (SYNTHROID ) 75 MCG tablet TAKE 1 TABLET(75 MCG) BY MOUTH DAILY (Patient not taking: Reported on 01/19/2024) 30 tablet 0   metroNIDAZOLE  (FLAGYL ) 500 MG tablet Take 1 tablet (500 mg total) by mouth 2 (two) times daily. (Patient not taking: Reported on 01/19/2024) 14 tablet 0   nitrofurantoin , macrocrystal-monohydrate, (MACROBID ) 100 MG capsule Take 1 capsule (100 mg total) by mouth 2 (two) times daily. 10 capsule 0   No facility-administered medications prior to visit.      Review of Systems  Review of Systems   Physical Exam  There were no vitals taken for this visit. Physical Exam  ***  Lab Results:  CBC    Component Value Date/Time   WBC 6.5 01/19/2024 1503   RBC 4.42 01/19/2024 1503   HGB 14.1 01/19/2024 1503   HGB 12.2 06/10/2021 0851   HCT 41.7 01/19/2024 1503   HCT 35.0 06/10/2021 0851   PLT 277.0 01/19/2024 1503   PLT 272 06/10/2021 0851   MCV 94.4 01/19/2024 1503   MCV 96 06/10/2021 0851   MCH 32.3 06/06/2023 0915   MCHC 33.9 01/19/2024 1503   RDW 13.3 01/19/2024 1503   RDW 12.3 06/10/2021 0851   LYMPHSABS 2.5 01/19/2024 1503   LYMPHSABS 2.3 02/17/2021 1540   MONOABS 0.3 01/19/2024 1503   EOSABS 0.1 01/19/2024 1503   EOSABS 0.1 02/17/2021 1540   BASOSABS 0.0 01/19/2024 1503   BASOSABS 0.0 02/17/2021 1540    BMET    Component Value Date/Time   NA 139 06/06/2023 0915   NA 139 06/23/2020 1205   K 3.5 06/06/2023 0915   CL 109 06/06/2023 0915   CO2 22 06/06/2023 0915   GLUCOSE 98 06/06/2023 0915   BUN 10 06/06/2023 0915   BUN 12 06/23/2020 1205   CREATININE 0.90 06/06/2023 0915   CREATININE 0.91 01/03/2017 1525   CALCIUM 9.1 06/06/2023 0915   GFRNONAA >60 06/06/2023 0915   GFRAA >90 12/28/2013 2015    BNP No results found for: BNP  ProBNP No results found for:  PROBNP  Imaging: CT Chest High Resolution Result Date: 01/29/2024 CLINICAL DATA:  Dyspnea on exertion EXAM: CT CHEST WITHOUT CONTRAST TECHNIQUE: Multidetector CT imaging of the chest was performed following the standard protocol without intravenous contrast. High resolution imaging of the lungs, as well as inspiratory and expiratory imaging, was performed. RADIATION DOSE REDUCTION: This exam was performed according to the departmental dose-optimization program which includes automated exposure control, adjustment of the mA and/or kV according to patient size and/or use of iterative reconstruction technique. COMPARISON:  None Available. FINDINGS: Cardiovascular: No significant vascular findings. Normal heart size. No pericardial effusion. Mediastinum/Nodes: No enlarged mediastinal, hilar, or axillary lymph nodes. Thymic remnant in the anterior mediastinum. Thyroid gland, trachea, and esophagus demonstrate no significant findings. Lungs/Pleura: No evidence of fibrotic interstitial lung disease. No evidence of air trapping on  expiratory phase imaging; note however that expiratory phases submitted for review are performed at near full inspiration and accordingly limited. Minimal scarring of the lingula (series 4, image 103). No pleural effusion or pneumothorax. Upper Abdomen: No acute abnormality. Musculoskeletal: No chest wall abnormality. No acute osseous findings. IMPRESSION: 1. No evidence of fibrotic interstitial lung disease. 2. No evidence of air trapping on expiratory phase imaging; note however that expiratory phases submitted for review are performed at near full inspiration and accordingly limited. 3. No acute findings in the chest. Electronically Signed   By: Marolyn JONETTA Jaksch M.D.   On: 01/29/2024 16:31     Assessment & Plan:   No problem-specific Assessment & Plan notes found for this encounter.   There are no diagnoses linked to this encounter.  Assessment and Plan Assessment &  Plan       I personally spent a total of *** minutes in the care of the patient today including {Time Based Coding:[id number]}.   Almarie LELON Ferrari, NP 02/21/2024

## 2024-04-06 ENCOUNTER — Ambulatory Visit: Admitting: Primary Care

## 2024-04-20 ENCOUNTER — Telehealth: Payer: Self-pay | Admitting: Family

## 2024-04-20 ENCOUNTER — Encounter: Payer: Self-pay | Admitting: Family

## 2024-04-20 NOTE — Telephone Encounter (Signed)
 Called pt to reschedule appt scheduled for Monday due to incoming inclement weather; left vm to call office back to reschedule. Appt has been cancelled.

## 2024-04-23 ENCOUNTER — Encounter: Admitting: Family
